# Patient Record
Sex: Male | Born: 1945 | Race: White | Hispanic: No | Marital: Single | Smoking: Former smoker
Health system: Southern US, Community
[De-identification: ages and names within clinical notes are randomized; demographics above are authoritative.]

## PROBLEM LIST (undated history)

## (undated) DIAGNOSIS — E119 Type 2 diabetes mellitus without complications: Secondary | ICD-10-CM

## (undated) DIAGNOSIS — F32A Depression, unspecified: Secondary | ICD-10-CM

## (undated) DIAGNOSIS — R5383 Other fatigue: Secondary | ICD-10-CM

## (undated) DIAGNOSIS — IMO0001 Reserved for inherently not codable concepts without codable children: Secondary | ICD-10-CM

## (undated) DIAGNOSIS — K219 Gastro-esophageal reflux disease without esophagitis: Secondary | ICD-10-CM

## (undated) DIAGNOSIS — N183 Chronic kidney disease, stage 3 unspecified: Secondary | ICD-10-CM

## (undated) DIAGNOSIS — F329 Major depressive disorder, single episode, unspecified: Secondary | ICD-10-CM

## (undated) DIAGNOSIS — M1990 Unspecified osteoarthritis, unspecified site: Secondary | ICD-10-CM

## (undated) DIAGNOSIS — E785 Hyperlipidemia, unspecified: Secondary | ICD-10-CM

## (undated) DIAGNOSIS — J449 Chronic obstructive pulmonary disease, unspecified: Secondary | ICD-10-CM

## (undated) DIAGNOSIS — D631 Anemia in chronic kidney disease: Secondary | ICD-10-CM

---

## 2012-11-12 DIAGNOSIS — R209 Unspecified disturbances of skin sensation: Secondary | ICD-10-CM

## 2013-04-08 ENCOUNTER — Encounter (HOSPITAL_COMMUNITY): Payer: Self-pay | Admitting: Emergency Medicine

## 2013-04-08 ENCOUNTER — Emergency Department (HOSPITAL_COMMUNITY): Payer: Medicare Other

## 2013-04-08 ENCOUNTER — Emergency Department (HOSPITAL_COMMUNITY)
Admission: EM | Admit: 2013-04-08 | Discharge: 2013-04-08 | Disposition: A | Discharge status: Home / Self Care | Payer: Medicare Other | Attending: Emergency Medicine | Admitting: Emergency Medicine

## 2013-04-08 DIAGNOSIS — S7002XA Contusion of left hip, initial encounter: Secondary | ICD-10-CM

## 2013-04-08 DIAGNOSIS — S7000XA Contusion of unspecified hip, initial encounter: Secondary | ICD-10-CM | POA: Insufficient documentation

## 2013-04-08 DIAGNOSIS — Y9210 Unspecified residential institution as the place of occurrence of the external cause: Secondary | ICD-10-CM | POA: Insufficient documentation

## 2013-04-08 DIAGNOSIS — E119 Type 2 diabetes mellitus without complications: Secondary | ICD-10-CM | POA: Insufficient documentation

## 2013-04-08 DIAGNOSIS — Z862 Personal history of diseases of the blood and blood-forming organs and certain disorders involving the immune mechanism: Secondary | ICD-10-CM | POA: Insufficient documentation

## 2013-04-08 DIAGNOSIS — F329 Major depressive disorder, single episode, unspecified: Secondary | ICD-10-CM | POA: Insufficient documentation

## 2013-04-08 DIAGNOSIS — M129 Arthropathy, unspecified: Secondary | ICD-10-CM | POA: Insufficient documentation

## 2013-04-08 DIAGNOSIS — F17200 Nicotine dependence, unspecified, uncomplicated: Secondary | ICD-10-CM | POA: Insufficient documentation

## 2013-04-08 DIAGNOSIS — W010XXA Fall on same level from slipping, tripping and stumbling without subsequent striking against object, initial encounter: Secondary | ICD-10-CM | POA: Insufficient documentation

## 2013-04-08 DIAGNOSIS — J441 Chronic obstructive pulmonary disease with (acute) exacerbation: Secondary | ICD-10-CM | POA: Insufficient documentation

## 2013-04-08 DIAGNOSIS — Z7982 Long term (current) use of aspirin: Secondary | ICD-10-CM | POA: Insufficient documentation

## 2013-04-08 DIAGNOSIS — S20229A Contusion of unspecified back wall of thorax, initial encounter: Secondary | ICD-10-CM | POA: Insufficient documentation

## 2013-04-08 DIAGNOSIS — S300XXA Contusion of lower back and pelvis, initial encounter: Secondary | ICD-10-CM

## 2013-04-08 DIAGNOSIS — Y939 Activity, unspecified: Secondary | ICD-10-CM | POA: Insufficient documentation

## 2013-04-08 DIAGNOSIS — K219 Gastro-esophageal reflux disease without esophagitis: Secondary | ICD-10-CM | POA: Insufficient documentation

## 2013-04-08 DIAGNOSIS — F3289 Other specified depressive episodes: Secondary | ICD-10-CM | POA: Insufficient documentation

## 2013-04-08 DIAGNOSIS — IMO0002 Reserved for concepts with insufficient information to code with codable children: Secondary | ICD-10-CM | POA: Insufficient documentation

## 2013-04-08 DIAGNOSIS — Z79899 Other long term (current) drug therapy: Secondary | ICD-10-CM | POA: Insufficient documentation

## 2013-04-08 HISTORY — DX: Depression, unspecified: F32.A

## 2013-04-08 HISTORY — DX: Major depressive disorder, single episode, unspecified: F32.9

## 2013-04-08 HISTORY — DX: Reserved for inherently not codable concepts without codable children: IMO0001

## 2013-04-08 HISTORY — DX: Gastro-esophageal reflux disease without esophagitis: K21.9

## 2013-04-08 HISTORY — DX: Other fatigue: R53.83

## 2013-04-08 HISTORY — DX: Type 2 diabetes mellitus without complications: E11.9

## 2013-04-08 HISTORY — DX: Unspecified osteoarthritis, unspecified site: M19.90

## 2013-04-08 HISTORY — DX: Chronic obstructive pulmonary disease, unspecified: J44.9

## 2013-04-08 LAB — GLUCOSE, CAPILLARY: Glucose-Capillary: 236 mg/dL — ABNORMAL HIGH (ref 70–99)

## 2013-04-08 MED ORDER — HYDROCODONE-ACETAMINOPHEN 5-325 MG PO TABS
1.0000 | ORAL_TABLET | Freq: Once | ORAL | Status: AC
  Administered 2013-04-08: 1 via ORAL
  Filled 2013-04-08: qty 1

## 2013-04-08 MED ORDER — IBUPROFEN 400 MG PO TABS
400.0000 mg | ORAL_TABLET | Freq: Once | ORAL | Status: AC
  Administered 2013-04-08: 400 mg via ORAL
  Filled 2013-04-08: qty 1

## 2013-04-08 MED ORDER — IBUPROFEN 400 MG PO TABS: 400.0000 mg | ORAL_TABLET | Freq: Four times a day (QID) | ORAL | Status: DC | PRN

## 2013-04-08 MED ORDER — HYDROCODONE-ACETAMINOPHEN 5-325 MG PO TABS
1.0000 | ORAL_TABLET | Freq: Four times a day (QID) | ORAL | Status: DC | PRN
Start: 2013-04-08 — End: 2017-07-04

## 2013-04-08 NOTE — Discharge Instructions (Signed)
The x-ray of your lower back is negative for fracture or dislocation. Your examination is consistent with a bruise or contusion to the back and upper hip area. Please use ibuprofen every 6 hours for mild pain, use Norco for more severe pain. Norco may cause drowsiness, please use with caution. Please see your primary physician, or follow up with the orthopedist listed above if not improving. Contusion A contusion is a deep bruise. Contusions happen when an injury causes bleeding under the skin. Signs of bruising include pain, puffiness (swelling), and discolored skin. The contusion may turn blue, purple, or yellow. HOME CARE   Put ice on the injured area.  Put ice in a plastic bag.  Place a towel between your skin and the bag.  Leave the ice on for 15-20 minutes, 03-04 times a day.  Only take medicine as told by your doctor.  Rest the injured area.  If possible, raise (elevate) the injured area to lessen puffiness. GET HELP RIGHT AWAY IF:   You have more bruising or puffiness.  You have pain that is getting worse.  Your puffiness or pain is not helped by medicine. MAKE SURE YOU:   Understand these instructions.  Will watch your condition.  Will get help right away if you are not doing well or get worse. Document Released: 08/29/2007 Document Revised: 06/04/2011 Document Reviewed: 01/15/2011 [facility] Patient Information 2014 [place]ExitCare, [place].

## 2013-04-08 NOTE — ED Notes (Signed)
Pt states tripped and fell Saturday. Pt is resident of [facility]. Ambulating well. Pt c/o pain to left lower back radiating into hip and down leg.

## 2013-04-08 NOTE — ED Notes (Signed)
Med rec completed by Kathie DikePharm Tech.

## 2013-04-08 NOTE — ED Provider Notes (Signed)
CSN: [id number]     Arrival date & time 04/08/13  1351 History   First MD Initiated Contact with Patient 04/08/13 1413     Chief Complaint  Patient presents with  . Back Pain   (Consider location/radiation/quality/duration/timing/severity/associated sxs/prior Treatment) HPI Comments: Patient is a [age] year resident of one of the local assisted living facilities. The patient states that on Saturday, January 10 he sustained a fall from a standing position. The patient states he has had pain and soreness of the left lower back and hip since that time. Today he noticed a pain that went out only to the hip but part way down the left thigh and presents now to the emergency department for additional evaluation of this problem. The patient denies any previous operations or procedures involving the left hip or the back. He denies being on any blood thinning type medications. And he denies any bleeding disorders. He has been using conservative measures at the assisted living facility, and continues to have pain. He is ambulatory, but states he is to take a" slow".  The history is provided by the patient.    Past Medical History  Diagnosis Date  . Diabetes mellitus without complication   . Reflux   . Anemia   . Arthritis   . COPD (chronic obstructive pulmonary disease)   . Depression   . Fatigue    History reviewed. No pertinent past surgical history. History reviewed. No pertinent family history. History  Substance Use Topics  . Smoking status: Current Every Day Smoker    Types: Pipe  . Smokeless tobacco: Not on file  . Alcohol Use: No    Review of Systems  Constitutional: Negative for activity change.       All ROS Neg except as noted in HPI  HENT: Negative for nosebleeds.   Eyes: Negative for photophobia and discharge.  Respiratory: Positive for cough and shortness of breath. Negative for wheezing.   Cardiovascular: Negative for chest pain and palpitations.  Gastrointestinal: Negative  for abdominal pain and blood in stool.  Genitourinary: Negative for dysuria, frequency and hematuria.  Musculoskeletal: Positive for arthralgias and back pain. Negative for neck pain.  Skin: Negative.   Neurological: Negative for dizziness, seizures and speech difficulty.  Psychiatric/Behavioral: Negative for hallucinations and confusion.       Depression    Allergies  Review of patient's allergies indicates no known allergies.  Home Medications   Current Outpatient Rx  Name  Route  Sig  Dispense  Refill  . aspirin EC 81 MG tablet   Oral   Take 81 mg by mouth daily.         . cetirizine (ZYRTEC) 10 MG tablet   Oral   Take 10 mg by mouth at bedtime.         . cholecalciferol (VITAMIN D) 1000 UNITS tablet   Oral   Take 2,000 Units by mouth daily.         . citalopram (CELEXA) 40 MG tablet   Oral   Take 1 tablet by mouth at bedtime.         . COMBIVENT RESPIMAT 20-100 MCG/ACT AERS respimat   Inhalation   Inhale 2 puffs into the lungs 2 (two) times daily.         . fluticasone (FLONASE) 50 MCG/ACT nasal spray   Each Nare   Place 2 sprays into both nostrils daily.         . hydrOXYzine (ATARAX/VISTARIL) 25 MG tablet  Oral   Take 1 tablet by mouth daily at 12 noon.         . lamoTRIgine (LAMICTAL) 100 MG tablet   Oral   Take 1 tablet by mouth daily.         Marland Kitchen LEVEMIR 100 UNIT/ML injection   Subcutaneous   Inject 34 Units into the skin at bedtime.         Marland Kitchen omeprazole (PRILOSEC) 20 MG capsule   Oral   Take 1 capsule by mouth daily.         . ONGLYZA 5 MG TABS tablet   Oral   Take 1 tablet by mouth daily.         . tamsulosin (FLOMAX) 0.4 MG CAPS capsule   Oral   Take 1 capsule by mouth daily.         . traZODone (DESYREL) 100 MG tablet   Oral   Take 1 tablet by mouth at bedtime.         Marland Kitchen HYDROcodone-acetaminophen (NORCO) 5-325 MG per tablet   Oral   Take 1 tablet by mouth every 6 (six) hours as needed for moderate pain.   15  tablet   0   . ibuprofen (ADVIL,MOTRIN) 400 MG tablet   Oral   Take 1 tablet (400 mg total) by mouth every 6 (six) hours as needed.   20 tablet   0    BP 115/63  Pulse 92  Temp(Src) 98 F (36.7 C) (Oral)  Resp 19  SpO2 95% Physical Exam  Nursing note and vitals reviewed. Constitutional: He is oriented to person, place, and time. He appears well-developed and well-nourished.  Non-toxic appearance.  HENT:  Head: Normocephalic.  Right Ear: Tympanic membrane and external ear normal.  Left Ear: Tympanic membrane and external ear normal.  Eyes: EOM and lids are normal. Pupils are equal, round, and reactive to light.  Neck: Normal range of motion. Neck supple. Carotid bruit is not present.  Cardiovascular: Normal rate, regular rhythm, normal heart sounds, intact distal pulses and normal pulses.   Pulmonary/Chest: Breath sounds normal. No respiratory distress.  Abdominal: Soft. Bowel sounds are normal. There is no tenderness. There is no guarding.  Musculoskeletal: Normal range of motion.  There is soreness to palpation of the lower lumbar area. It is also soreness to the paraspinal region of the lower lumbar region.  There is soreness with range of motion of the left hip. There is no evidence of dislocation. There is no fracture or shortening noted of the left lower extremity. There is no unusual swelling of the left lower extremity.  Lymphadenopathy:       Head (right side): No submandibular adenopathy present.       Head (left side): No submandibular adenopathy present.    He has no cervical adenopathy.  Neurological: He is alert and oriented to person, place, and time. He has normal strength. No cranial nerve deficit or sensory deficit.  Skin: Skin is warm and dry.  Psychiatric: He has a normal mood and affect. His speech is normal.    ED Course  Procedures (including critical care time) Labs Review Labs Reviewed  GLUCOSE, CAPILLARY - Abnormal; Notable for the following:     Glucose-Capillary 236 (*)    All other components within normal limits   Imaging Review Dg Hip Complete Left  04/08/2013   CLINICAL DATA:  Fall several days ago, low back pain radiating into left hip  EXAM: LEFT HIP - COMPLETE 2+ VIEW  COMPARISON:  Prior CT abdomen/ pelvis 11/12/2012  FINDINGS: There is no evidence of hip fracture or dislocation. There is no evidence of arthropathy or other focal bone abnormality.  IMPRESSION: Negative.   Electronically Signed   By: Malachy Moan M.D.   On: 04/08/2013 15:15    EKG Interpretation   None       MDM   1. Contusion of lower back   2. Contusion of left hip    *I have reviewed nursing notes, vital signs, and all appropriate lab and imaging results for this patient.**  Patient sustained a fall from a standing position on January 10. He continues to have pain and soreness in this area. X-ray of the left hip is negative for fracture or dislocation. The patient is ambulatory, and his gait is steady. Seen with me by Dr Adriana Simas.  The patient is given prescription for ibuprofen 400 mg every 6 hours, and Norco 5 mg every 6 hours if needed for pain. The patient is to follow up with his primary physician, or with Dr. Romeo Apple if not improving.  Kathie Dike, PA-C 04/08/13 [phone]

## 2013-04-11 NOTE — ED Provider Notes (Signed)
Medical screening examination/treatment/procedure(s) were performed by non-physician practitioner and as supervising physician I was immediately available for consultation/collaboration.  EKG Interpretation   None        Valmai Vandenberghe, MD 04/11/13 0826 

## 2013-08-12 ENCOUNTER — Emergency Department (HOSPITAL_COMMUNITY): Payer: Medicare Other

## 2013-08-12 ENCOUNTER — Emergency Department (HOSPITAL_COMMUNITY)
Admission: EM | Admit: 2013-08-12 | Discharge: 2013-08-12 | Disposition: A | Discharge status: Home / Self Care | Payer: Medicare Other | Attending: Emergency Medicine | Admitting: Emergency Medicine

## 2013-08-12 DIAGNOSIS — Y929 Unspecified place or not applicable: Secondary | ICD-10-CM | POA: Insufficient documentation

## 2013-08-12 DIAGNOSIS — S8990XA Unspecified injury of unspecified lower leg, initial encounter: Secondary | ICD-10-CM | POA: Insufficient documentation

## 2013-08-12 DIAGNOSIS — S99929A Unspecified injury of unspecified foot, initial encounter: Principal | ICD-10-CM

## 2013-08-12 DIAGNOSIS — J4489 Other specified chronic obstructive pulmonary disease: Secondary | ICD-10-CM | POA: Insufficient documentation

## 2013-08-12 DIAGNOSIS — IMO0002 Reserved for concepts with insufficient information to code with codable children: Secondary | ICD-10-CM | POA: Insufficient documentation

## 2013-08-12 DIAGNOSIS — F3289 Other specified depressive episodes: Secondary | ICD-10-CM | POA: Insufficient documentation

## 2013-08-12 DIAGNOSIS — W2203XA Walked into furniture, initial encounter: Secondary | ICD-10-CM | POA: Insufficient documentation

## 2013-08-12 DIAGNOSIS — Y9389 Activity, other specified: Secondary | ICD-10-CM | POA: Insufficient documentation

## 2013-08-12 DIAGNOSIS — Z7982 Long term (current) use of aspirin: Secondary | ICD-10-CM | POA: Insufficient documentation

## 2013-08-12 DIAGNOSIS — Z79899 Other long term (current) drug therapy: Secondary | ICD-10-CM | POA: Insufficient documentation

## 2013-08-12 DIAGNOSIS — Z862 Personal history of diseases of the blood and blood-forming organs and certain disorders involving the immune mechanism: Secondary | ICD-10-CM | POA: Insufficient documentation

## 2013-08-12 DIAGNOSIS — F17200 Nicotine dependence, unspecified, uncomplicated: Secondary | ICD-10-CM | POA: Insufficient documentation

## 2013-08-12 DIAGNOSIS — S99919A Unspecified injury of unspecified ankle, initial encounter: Principal | ICD-10-CM

## 2013-08-12 DIAGNOSIS — K219 Gastro-esophageal reflux disease without esophagitis: Secondary | ICD-10-CM | POA: Insufficient documentation

## 2013-08-12 DIAGNOSIS — I10 Essential (primary) hypertension: Secondary | ICD-10-CM | POA: Insufficient documentation

## 2013-08-12 DIAGNOSIS — E119 Type 2 diabetes mellitus without complications: Secondary | ICD-10-CM | POA: Insufficient documentation

## 2013-08-12 DIAGNOSIS — J449 Chronic obstructive pulmonary disease, unspecified: Secondary | ICD-10-CM | POA: Insufficient documentation

## 2013-08-12 DIAGNOSIS — R209 Unspecified disturbances of skin sensation: Secondary | ICD-10-CM | POA: Insufficient documentation

## 2013-08-12 DIAGNOSIS — F329 Major depressive disorder, single episode, unspecified: Secondary | ICD-10-CM | POA: Insufficient documentation

## 2013-08-12 DIAGNOSIS — Z9889 Other specified postprocedural states: Secondary | ICD-10-CM | POA: Insufficient documentation

## 2013-08-12 DIAGNOSIS — M129 Arthropathy, unspecified: Secondary | ICD-10-CM | POA: Insufficient documentation

## 2013-08-12 DIAGNOSIS — M25569 Pain in unspecified knee: Secondary | ICD-10-CM

## 2013-08-12 DIAGNOSIS — M6281 Muscle weakness (generalized): Secondary | ICD-10-CM | POA: Insufficient documentation

## 2013-08-12 LAB — CBC WITH DIFFERENTIAL/PLATELET
BASOS ABS: 0.1 10*3/uL (ref 0.0–0.1)
BASOS PCT: 1 % (ref 0–1)
Eosinophils Absolute: 0.4 10*3/uL (ref 0.0–0.7)
Eosinophils Relative: 6 % — ABNORMAL HIGH (ref 0–5)
HCT: 30.9 % — ABNORMAL LOW (ref 39.0–52.0)
Hemoglobin: 9.9 g/dL — ABNORMAL LOW (ref 13.0–17.0)
Lymphocytes Relative: 33 % (ref 12–46)
Lymphs Abs: 2.1 10*3/uL (ref 0.7–4.0)
MCH: 26.9 pg (ref 26.0–34.0)
MCHC: 32 g/dL (ref 30.0–36.0)
MCV: 84 fL (ref 78.0–100.0)
Monocytes Absolute: 0.5 10*3/uL (ref 0.1–1.0)
Monocytes Relative: 8 % (ref 3–12)
Neutro Abs: 3.3 10*3/uL (ref 1.7–7.7)
Neutrophils Relative %: 52 % (ref 43–77)
PLATELETS: 240 10*3/uL (ref 150–400)
RBC: 3.68 MIL/uL — ABNORMAL LOW (ref 4.22–5.81)
RDW: 15.3 % (ref 11.5–15.5)
WBC: 6.4 10*3/uL (ref 4.0–10.5)

## 2013-08-12 LAB — CBG MONITORING, ED: Glucose-Capillary: 153 mg/dL — ABNORMAL HIGH (ref 70–99)

## 2013-08-12 LAB — BASIC METABOLIC PANEL
BUN: 25 mg/dL — AB (ref 6–23)
CO2: 28 mEq/L (ref 19–32)
Calcium: 9.3 mg/dL (ref 8.4–10.5)
Chloride: 101 mEq/L (ref 96–112)
Creatinine, Ser: 1.37 mg/dL — ABNORMAL HIGH (ref 0.50–1.35)
GFR, EST AFRICAN AMERICAN: 61 mL/min — AB (ref >90)
GFR, EST NON AFRICAN AMERICAN: 53 mL/min — AB (ref >90)
Glucose, Bld: 176 mg/dL — ABNORMAL HIGH (ref 70–99)
POTASSIUM: 4.9 meq/L (ref 3.7–5.3)
SODIUM: 140 meq/L (ref 137–147)

## 2013-08-12 LAB — D-DIMER, QUANTITATIVE: D-Dimer, Quant: 0.5 ug/mL-FEU — ABNORMAL HIGH (ref 0.00–0.48)

## 2013-08-12 MED ORDER — ENOXAPARIN SODIUM 100 MG/ML ~~LOC~~ SOLN
96.0000 mg | Freq: Once | SUBCUTANEOUS | Status: AC
  Administered 2013-08-12: 95 mg via SUBCUTANEOUS
  Filled 2013-08-12: qty 1

## 2013-08-12 NOTE — ED Provider Notes (Signed)
CSN: [id number]     Arrival date & time 08/12/13  1555 History  This chart was scribed for Glynn OctaveStephen Elisavet Buehrer, MD by Danella Maiersaroline Early, ED Scribe. This patient was seen in room APA11/APA11 and the patient's care was started at 8:36 PM.    Chief Complaint  Patient presents with  . Leg Pain  . Knee Pain   The history is provided by the patient. No language interpreter was used.   HPI Comments: Douglas Cook is a 68 y.o. male with a h/o DM and COPD who [facility] complaining of constant right knee pain that radiates into the back of the right upper leg with associated, numbness, and tingling in the right leg since bumping the knee against the rail of the bed 2 weeks ago. He reports a h/o right knee surgery 5 years ago. Nothing makes the pain better or worse. He denies CP, SOB, abdominal pain, back pain, urinary or bowel incontinence. No h/o DVT or PE or kidney problems.    Past Medical History  Diagnosis Date  . Diabetes mellitus without complication   . Reflux   . Anemia   . Arthritis   . COPD (chronic obstructive pulmonary disease)   . Depression   . Fatigue    No past surgical history on file. No family history on file. History  Substance Use Topics  . Smoking status: Current Every Day Smoker    Types: Pipe  . Smokeless tobacco: Not on file  . Alcohol Use: No    Review of Systems  Respiratory: Negative for shortness of breath.   Cardiovascular: Negative for chest pain.  Gastrointestinal: Negative for abdominal pain.  Musculoskeletal: Negative for back pain.  Neurological: Positive for weakness and numbness.  All other systems reviewed and are negative.  A complete 10 system review of systems was obtained and all systems are negative except as noted in the HPI and PMH.     Allergies  Review of patient's allergies indicates no known allergies.  Home Medications   Prior to Admission medications   Medication Sig Start Date End  Date Taking? Authorizing Provider  aspirin EC 81 MG tablet Take 81 mg by mouth daily.    Historical Provider, MD  cetirizine (ZYRTEC) 10 MG tablet Take 10 mg by mouth at bedtime.    Historical Provider, MD  cholecalciferol (VITAMIN D) 1000 UNITS tablet Take 2,000 Units by mouth daily.    Historical Provider, MD  citalopram (CELEXA) 40 MG tablet Take 1 tablet by mouth at bedtime. 04/08/13   Historical Provider, MD  COMBIVENT RESPIMAT 20-100 MCG/ACT AERS respimat Inhale 2 puffs into the lungs 2 (two) times daily. 02/11/13   Historical Provider, MD  fluticasone (FLONASE) 50 MCG/ACT nasal spray Place 2 sprays into both nostrils daily. 03/02/13   Historical Provider, MD  HYDROcodone-acetaminophen (NORCO) 5-325 MG per tablet Take 1 tablet by mouth every 6 (six) hours as needed for moderate pain. 04/08/13   Kathie DikeHobson M Bryant, PA-C  hydrOXYzine (ATARAX/VISTARIL) 25 MG tablet Take 1 tablet by mouth daily at 12 noon. 04/08/13   Historical Provider, MD  ibuprofen (ADVIL,MOTRIN) 400 MG tablet Take 1 tablet (400 mg total) by mouth every 6 (six) hours as needed. 04/08/13   Kathie DikeHobson M Bryant, PA-C  lamoTRIgine (LAMICTAL) 100 MG tablet Take 1 tablet by mouth daily. 04/08/13   Historical Provider, MD  LEVEMIR 100 UNIT/ML injection Inject 34 Units into the skin at bedtime. 03/18/13   Historical Provider, MD  omeprazole (PRILOSEC) 20 MG capsule Take 1 capsule by mouth daily. 04/08/13   Historical Provider, MD  ONGLYZA 5 MG TABS tablet Take 1 tablet by mouth daily. 04/08/13   Historical Provider, MD  tamsulosin (FLOMAX) 0.4 MG CAPS capsule Take 1 capsule by mouth daily. 04/08/13   Historical Provider, MD  traZODone (DESYREL) 100 MG tablet Take 1 tablet by mouth at bedtime. 04/08/13   Historical Provider, MD   BP 119/60  Pulse 83  Temp(Src) 97.9 F (36.6 C) (Oral)  Resp 16  Ht 5\' 10"  (1.778 m)  Wt 212 lb (96.163 kg)  BMI 30.42 kg/m2  SpO2 97% Physical Exam  Nursing note and vitals reviewed. Constitutional: He is oriented  to person, place, and time. He appears well-developed and well-nourished. No distress.  HENT:  Head: Normocephalic and atraumatic.  Eyes: EOM are normal.  Neck: Neck supple. No tracheal deviation present.  Cardiovascular: Normal rate.   Pulmonary/Chest: Effort normal and breath sounds normal. No respiratory distress. He has no wheezes.  Musculoskeletal: Normal range of motion.  Full rom of the right knee with pain. No ligament laxity. Flexion and extension intact. Abrasion over the right patellar tendon. Intact DP pulses. Slightly increased pretibial edema on right compared to left.  5/5 strength in bilateral lower extremities. Ankle plantar and dorsiflexion intact. Great toe extension intact bilaterally. +2 DP and PT pulses. +2 patellar reflexes bilaterally. Normal gait.   Neurological: He is alert and oriented to person, place, and time.  Skin: Skin is warm and dry.  Psychiatric: He has a normal mood and affect. His behavior is normal.    ED Course  Procedures (including critical care time) Medications  enoxaparin (LOVENOX) injection 95 mg (95 mg Subcutaneous Given 08/12/13 2255)    DIAGNOSTIC STUDIES: Oxygen Saturation is 97% on RA, normal by my interpretation.    COORDINATION OF CARE: 8:44 PM- Discussed treatment plan with pt which includes blood work and return tomorrow for [place]. Pt agrees to plan.    Labs Review Labs Reviewed  CBC WITH DIFFERENTIAL - Abnormal; Notable for the following:    RBC 3.68 (*)    Hemoglobin 9.9 (*)    HCT 30.9 (*)    Eosinophils Relative 6 (*)    All other components within normal limits  BASIC METABOLIC PANEL - Abnormal; Notable for the following:    Glucose, Bld 176 (*)    BUN 25 (*)    Creatinine, Ser 1.37 (*)    GFR calc non Af Amer 53 (*)    GFR calc Af Amer 61 (*)    All other components within normal limits  D-DIMER, QUANTITATIVE - Abnormal; Notable for the following:    D-Dimer, Quant 0.50 (*)    All other components within normal  limits  CBG MONITORING, ED - Abnormal; Notable for the following:    Glucose-Capillary 153 (*)    All other components within normal limits    Imaging Review Dg Knee Complete 4 Views Right  08/12/2013   CLINICAL DATA:  Right knee pain radiating back into the leg for 1 week.  EXAM: RIGHT KNEE - COMPLETE 4+ VIEW  COMPARISON:  None.  FINDINGS: There is a small knee joint effusion. No acute fracture or dislocation is identified. Tricompartmental chondrocalcinosis is present. Very mild tricompartmental marginal osteophyte formation is noted. Small superior patellar enthesophyte is present. Joint spaces are preserved. Bone mineralization appears normal.  IMPRESSION: 1. No acute osseous abnormality identified. 2. Small knee joint effusion. 3. Chondrocalcinosis, which is  nonspecific but can be seen in the setting of calcium pyrophosphate deposition disease.   Electronically Signed   By: Sebastian AcheAllen  Grady   On: 08/12/2013 18:03     EKG Interpretation None      MDM   Final diagnoses:  Knee pain   Right knee pain for the past 2 weeks after hitting it on a railing. No weakness, numbness or tingling. Patient reports it pops with movement. No chest pain or shortness of breath. No back pain.  Full range of motion of the knee. Flexion and extension intact. X-ray negative for acute osseous abnormality. Concern for a blood clot possibly will dose Lovenox and obtaining ultrasound tomorrow. Patient able to ambulate without difficulty.  Knee sleeve given.   BP 129/67  Pulse 62  Temp(Src) 98.1 F (36.7 C) (Oral)  Resp 20  Ht 5\' 10"  (1.778 m)  Wt 212 lb (96.163 kg)  BMI 30.42 kg/m2  SpO2 97%   I personally performed the services described in this documentation, which was scribed in my presence. The recorded information has been reviewed and is accurate.   Glynn OctaveStephen Delance Weide, MD 08/12/13 2320

## 2013-08-12 NOTE — ED Notes (Signed)
Patient c/o pain in right knee that radiates into back leg with movement. Per patient pain starts in both sides of knees and pain is worse with moving knee. Patient states knee pops with movement. Patient reports taking ibuprofen with no relief.

## 2013-08-12 NOTE — Discharge Instructions (Signed)
Arthralgia Use anti-inflammatories as needed for pain. Return tomorrow for ultrasound of your leg. Return to the ER sooner if you develop new or worsening symptoms. Your caregiver has diagnosed you as suffering from an arthralgia. Arthralgia means there is pain in a joint. This can come from many reasons including:  Bruising the joint which causes soreness (inflammation) in the joint.  Wear and tear on the joints which occur as we grow older (osteoarthritis).  Overusing the joint.  Various forms of arthritis.  Infections of the joint. Regardless of the cause of pain in your joint, most of these different pains respond to anti-inflammatory drugs and rest. The exception to this is when a joint is infected, and these cases are treated with antibiotics, if it is a bacterial infection. HOME CARE INSTRUCTIONS   Rest the injured area for as long as directed by your caregiver. Then slowly start using the joint as directed by your caregiver and as the pain allows. Crutches as directed may be useful if the ankles, knees or hips are involved. If the knee was splinted or casted, continue use and care as directed. If an stretchy or elastic wrapping bandage has been applied today, it should be removed and re-applied every 3 to 4 hours. It should not be applied tightly, but firmly enough to keep swelling down. Watch toes and feet for swelling, bluish discoloration, coldness, numbness or excessive pain. If any of these problems (symptoms) occur, remove the ace bandage and re-apply more loosely. If these symptoms persist, contact your caregiver or return to this location.  For the first 24 hours, keep the injured extremity elevated on pillows while lying down.  Apply ice for 15-20 minutes to the sore joint every couple hours while awake for the first half day. Then 03-04 times per day for the first 48 hours. Put the ice in a plastic bag and place a towel between the bag of ice and your skin.  Wear any  splinting, casting, elastic bandage applications, or slings as instructed.  Only take over-the-counter or prescription medicines for pain, discomfort, or fever as directed by your caregiver. Do not use aspirin immediately after the injury unless instructed by your physician. Aspirin can cause increased bleeding and bruising of the tissues.  If you were given crutches, continue to use them as instructed and do not resume weight bearing on the sore joint until instructed. Persistent pain and inability to use the sore joint as directed for more than 2 to 3 days are warning signs indicating that you should see a caregiver for a follow-up visit as soon as possible. Initially, a hairline fracture (break in bone) may not be evident on X-rays. Persistent pain and swelling indicate that further evaluation, non-weight bearing or use of the joint (use of crutches or slings as instructed), or further X-rays are indicated. X-rays may sometimes not show a small fracture until a week or 10 days later. Make a follow-up appointment with your own caregiver or one to whom we have referred you. A radiologist (specialist in reading X-rays) may read your X-rays. Make sure you know how you are to obtain your X-ray results. Do not assume everything is normal if you do not hear from us. SEEK MEDICAL CARE IF: Bruising, swelling, or pain increases. SEEK IMMEDIATE MEDICAL CARE IF:   Your fingers or toes are numb or blue.  The pain is not responding to medications and continues to stay the same or get worse.  The pain in your joint becomes  severe.  You develop a fever over 102 F (38.9 C).  It becomes impossible to move or use the joint. MAKE SURE YOU:   Understand these instructions.  Will watch your condition.  Will get help right away if you are not doing well or get worse. Document Released: 03/12/2005 Document Revised: 06/04/2011 Document Reviewed: 10/29/2007 [facility] Patient Information 2014 [place],  [place].

## 2013-08-13 ENCOUNTER — Ambulatory Visit (HOSPITAL_COMMUNITY)
Admit: 2013-08-13 | Discharge: 2013-08-13 | Disposition: A | Discharge status: Home / Self Care | Payer: Medicare Other | Source: Ambulatory Visit | Attending: Emergency Medicine | Admitting: Emergency Medicine

## 2013-08-13 ENCOUNTER — Other Ambulatory Visit (HOSPITAL_COMMUNITY): Payer: Self-pay | Admitting: Emergency Medicine

## 2013-08-13 DIAGNOSIS — M7989 Other specified soft tissue disorders: Secondary | ICD-10-CM | POA: Insufficient documentation

## 2013-08-13 DIAGNOSIS — M79669 Pain in unspecified lower leg: Secondary | ICD-10-CM

## 2014-11-21 ENCOUNTER — Encounter (HOSPITAL_COMMUNITY): Payer: Self-pay

## 2014-11-21 ENCOUNTER — Emergency Department (HOSPITAL_COMMUNITY)
Admission: EM | Admit: 2014-11-21 | Discharge: 2014-11-21 | Disposition: A | Discharge status: Home / Self Care | Payer: Medicare Other | Attending: Emergency Medicine | Admitting: Emergency Medicine

## 2014-11-21 ENCOUNTER — Emergency Department (HOSPITAL_COMMUNITY): Payer: Medicare Other

## 2014-11-21 DIAGNOSIS — Z7951 Long term (current) use of inhaled steroids: Secondary | ICD-10-CM | POA: Diagnosis not present

## 2014-11-21 DIAGNOSIS — Z794 Long term (current) use of insulin: Secondary | ICD-10-CM | POA: Diagnosis not present

## 2014-11-21 DIAGNOSIS — J449 Chronic obstructive pulmonary disease, unspecified: Secondary | ICD-10-CM | POA: Insufficient documentation

## 2014-11-21 DIAGNOSIS — Z7982 Long term (current) use of aspirin: Secondary | ICD-10-CM | POA: Insufficient documentation

## 2014-11-21 DIAGNOSIS — E119 Type 2 diabetes mellitus without complications: Secondary | ICD-10-CM | POA: Insufficient documentation

## 2014-11-21 DIAGNOSIS — Z862 Personal history of diseases of the blood and blood-forming organs and certain disorders involving the immune mechanism: Secondary | ICD-10-CM | POA: Insufficient documentation

## 2014-11-21 DIAGNOSIS — Z720 Tobacco use: Secondary | ICD-10-CM | POA: Insufficient documentation

## 2014-11-21 DIAGNOSIS — K219 Gastro-esophageal reflux disease without esophagitis: Secondary | ICD-10-CM | POA: Diagnosis not present

## 2014-11-21 DIAGNOSIS — R079 Chest pain, unspecified: Secondary | ICD-10-CM | POA: Diagnosis present

## 2014-11-21 DIAGNOSIS — F329 Major depressive disorder, single episode, unspecified: Secondary | ICD-10-CM | POA: Diagnosis not present

## 2014-11-21 DIAGNOSIS — M1990 Unspecified osteoarthritis, unspecified site: Secondary | ICD-10-CM | POA: Insufficient documentation

## 2014-11-21 DIAGNOSIS — R0789 Other chest pain: Secondary | ICD-10-CM | POA: Diagnosis not present

## 2014-11-21 DIAGNOSIS — Z79899 Other long term (current) drug therapy: Secondary | ICD-10-CM | POA: Insufficient documentation

## 2014-11-21 LAB — CBC WITH DIFFERENTIAL/PLATELET
BASOS ABS: 0.1 10*3/uL (ref 0.0–0.1)
Basophils Relative: 1 % (ref 0–1)
EOS ABS: 1 10*3/uL — AB (ref 0.0–0.7)
EOS PCT: 11 % — AB (ref 0–5)
HCT: 28.4 % — ABNORMAL LOW (ref 39.0–52.0)
Hemoglobin: 8.9 g/dL — ABNORMAL LOW (ref 13.0–17.0)
LYMPHS ABS: 1.9 10*3/uL (ref 0.7–4.0)
LYMPHS PCT: 21 % (ref 12–46)
MCH: 25.5 pg — AB (ref 26.0–34.0)
MCHC: 31.3 g/dL (ref 30.0–36.0)
MCV: 81.4 fL (ref 78.0–100.0)
MONO ABS: 0.7 10*3/uL (ref 0.1–1.0)
Monocytes Relative: 8 % (ref 3–12)
Neutro Abs: 5.4 10*3/uL (ref 1.7–7.7)
Neutrophils Relative %: 59 % (ref 43–77)
PLATELETS: 248 10*3/uL (ref 150–400)
RBC: 3.49 MIL/uL — ABNORMAL LOW (ref 4.22–5.81)
RDW: 15.9 % — AB (ref 11.5–15.5)
WBC: 9 10*3/uL (ref 4.0–10.5)

## 2014-11-21 LAB — BASIC METABOLIC PANEL
Anion gap: 4 — ABNORMAL LOW (ref 5–15)
BUN: 25 mg/dL — AB (ref 6–20)
CALCIUM: 8.9 mg/dL (ref 8.9–10.3)
CO2: 29 mmol/L (ref 22–32)
Chloride: 106 mmol/L (ref 101–111)
Creatinine, Ser: 1.7 mg/dL — ABNORMAL HIGH (ref 0.61–1.24)
GFR calc Af Amer: 46 mL/min — ABNORMAL LOW (ref >60)
GFR, EST NON AFRICAN AMERICAN: 40 mL/min — AB (ref >60)
GLUCOSE: 178 mg/dL — AB (ref 65–99)
Potassium: 4.5 mmol/L (ref 3.5–5.1)
SODIUM: 139 mmol/L (ref 135–145)

## 2014-11-21 LAB — TROPONIN I: Troponin I: <0.03 ng/mL (ref <0.031)

## 2014-11-21 NOTE — ED Provider Notes (Signed)
CSN: [id number]     Arrival date & time 11/21/14  1448 History   First MD Initiated Contact with Patient 11/21/14 1450     Chief Complaint  Patient presents with  . Chest Pain     (Consider location/radiation/quality/duration/timing/severity/associated sxs/prior Treatment) HPI Comments: Patient presents to the emergency department for evaluation of chest pain. Patient reports that pain began around 8:30 or 9 AM. He reports a sharp pain in the left chest and axillary region. Patient has noticed that the pain worsens with movements and raising his arm. He denies any direct injury. He is not feeling short of breath. No nausea, diaphoresis. Patient has been given aspirin and nitroglycerin prior to arrival.  Patient is a 68 y.o. male presenting with chest pain.  Chest Pain Associated symptoms: no shortness of breath     Past Medical History  Diagnosis Date  . Diabetes mellitus without complication   . Reflux   . Anemia   . Arthritis   . COPD (chronic obstructive pulmonary disease)   . Depression   . Fatigue    History reviewed. No pertinent past surgical history. History reviewed. No pertinent family history. Social History  Substance Use Topics  . Smoking status: Current Every Day Smoker    Types: Pipe  . Smokeless tobacco: None  . Alcohol Use: No    Review of Systems  Respiratory: Negative for shortness of breath.   Cardiovascular: Positive for chest pain.  All other systems reviewed and are negative.     Allergies  Review of patient's allergies indicates no known allergies.  Home Medications   Prior to Admission medications   Medication Sig Start Date End Date Taking? Authorizing Provider  aspirin EC 81 MG tablet Take 81 mg by mouth daily.    Historical Provider, MD  cetirizine (ZYRTEC) 10 MG tablet Take 10 mg by mouth at bedtime.    Historical Provider, MD  cholecalciferol (VITAMIN D) 1000 UNITS tablet Take 2,000 Units by mouth daily.    Historical Provider, MD    citalopram (CELEXA) 40 MG tablet Take 1 tablet by mouth at bedtime. 04/08/13   Historical Provider, MD  COMBIVENT RESPIMAT 20-100 MCG/ACT AERS respimat Inhale 2 puffs into the lungs 2 (two) times daily. 02/11/13   Historical Provider, MD  fluticasone (FLONASE) 50 MCG/ACT nasal spray Place 2 sprays into both nostrils daily. 03/02/13   Historical Provider, MD  glyBURIDE-metformin (GLUCOVANCE) 5-500 MG per tablet Take 1 tablet by mouth 2 (two) times daily.    Historical Provider, MD  HYDROcodone-acetaminophen (NORCO) 5-325 MG per tablet Take 1 tablet by mouth every 6 (six) hours as needed for moderate pain. 04/08/13   Ivery Quale, PA-C  hydrOXYzine (ATARAX/VISTARIL) 25 MG tablet Take 1 tablet by mouth daily at 12 noon. 04/08/13   Historical Provider, MD  ibuprofen (ADVIL,MOTRIN) 400 MG tablet Take 400 mg by mouth every 6 (six) hours as needed. pain    Historical Provider, MD  insulin lispro (HUMALOG KWIKPEN) 100 UNIT/ML KiwkPen Inject 8 Units into the skin daily at 12 noon.    Historical Provider, MD  lamoTRIgine (LAMICTAL) 100 MG tablet Take 1 tablet by mouth daily. 04/08/13   Historical Provider, MD  LEVEMIR 100 UNIT/ML injection Inject 50 Units into the skin at bedtime.  03/18/13   Historical Provider, MD  omeprazole (PRILOSEC) 20 MG capsule Take 1 capsule by mouth daily. 04/08/13   Historical Provider, MD  ONGLYZA 5 MG TABS tablet Take 1 tablet by mouth daily. 04/08/13   Historical  Provider, MD  tamsulosin (FLOMAX) 0.4 MG CAPS capsule Take 1 capsule by mouth daily. 04/08/13   Historical Provider, MD  traZODone (DESYREL) 100 MG tablet Take 1 tablet by mouth at bedtime. 04/08/13   Historical Provider, MD   There were no vitals taken for this visit. Physical Exam  Constitutional: He is oriented to person, place, and time. He appears well-developed and well-nourished. No distress.  HENT:  Head: Normocephalic and atraumatic.  Right Ear: Hearing normal.  Left Ear: Hearing normal.  Nose: Nose normal.   Mouth/Throat: Oropharynx is clear and moist and mucous membranes are normal.  Eyes: Conjunctivae and EOM are normal. Pupils are equal, round, and reactive to light.  Neck: Normal range of motion. Neck supple.  Cardiovascular: Regular rhythm, S1 normal and S2 normal.  Exam reveals no gallop and no friction rub.   No murmur heard. Pulmonary/Chest: Effort normal and breath sounds normal. No respiratory distress. He exhibits tenderness.    Abdominal: Soft. Normal appearance and bowel sounds are normal. There is no hepatosplenomegaly. There is no tenderness. There is no rebound, no guarding, no tenderness at McBurney's point and negative Murphy's sign. No hernia.  Musculoskeletal: Normal range of motion.  Neurological: He is alert and oriented to person, place, and time. He has normal strength. No cranial nerve deficit or sensory deficit. Coordination normal. GCS eye subscore is 4. GCS verbal subscore is 5. GCS motor subscore is 6.  Skin: Skin is warm, dry and intact. No rash noted. No cyanosis.  Psychiatric: He has a normal mood and affect. His speech is normal and behavior is normal. Thought content normal.  Nursing note and vitals reviewed.   ED Course  Procedures (including critical care time) Labs Review Labs Reviewed - No data to display  Imaging Review No results found. I have personally reviewed and evaluated these images and lab results as part of my medical decision-making.   EKG Interpretation   Date/Time:  Sunday November 21 2014 14:52:54 EDT Ventricular Rate:  79 PR Interval:  166 QRS Duration: 76 QT Interval:  388 QTC Calculation: 445 R Axis:   72 Text Interpretation:  Sinus rhythm Baseline wander in lead(s) I III aVL No  previous tracing Confirmed by Natalie Mceuen  MD, Dayvin Aber ([id number]) on  11/21/2014 2:56:27 PM      MDM   Final diagnoses:  None   chest wall pain  Patient presents to the ER for evaluation of chest pain. Pain have been present for several hours of  arrival to the ER. Pain is very atypical in nature. Patient has reproduction of pain with palpation of the chest wall and he has significant pain with moving his left arm or bending and twisting the torso. He does, however, have cardiac risk factors including hypertension, high cholesterol, diabetes. EKG does not show any abnormalities at arrival. Troponin was negative. Patient was monitored for 3 hours in the ER and repeat troponin was negative once again. Patient is felt to be low risk and appropriate for prompt outpatient follow-up with PCP. Patient is to return to the ER for symptoms worsen.    Gilda Crease, MD 11/21/14 1800

## 2014-11-21 NOTE — Discharge Instructions (Signed)

## 2014-11-21 NOTE — ED Notes (Signed)
Pt in by EMS. Resident of [employer]. Resident reports that pain started in left chest approx 900am and went to church . Came back from church and worsened. Worse on inhalation. Given Asa and Nitro which decreased to 5

## 2015-04-12 DIAGNOSIS — B351 Tinea unguium: Secondary | ICD-10-CM | POA: Diagnosis not present

## 2015-04-12 DIAGNOSIS — M79674 Pain in right toe(s): Secondary | ICD-10-CM | POA: Diagnosis not present

## 2015-04-12 DIAGNOSIS — M79675 Pain in left toe(s): Secondary | ICD-10-CM | POA: Diagnosis not present

## 2015-06-30 DIAGNOSIS — E1165 Type 2 diabetes mellitus with hyperglycemia: Secondary | ICD-10-CM | POA: Diagnosis not present

## 2015-06-30 DIAGNOSIS — J449 Chronic obstructive pulmonary disease, unspecified: Secondary | ICD-10-CM | POA: Diagnosis not present

## 2015-07-14 DIAGNOSIS — H6120 Impacted cerumen, unspecified ear: Secondary | ICD-10-CM | POA: Diagnosis not present

## 2015-07-14 DIAGNOSIS — Z299 Encounter for prophylactic measures, unspecified: Secondary | ICD-10-CM | POA: Diagnosis not present

## 2015-07-21 DIAGNOSIS — Z7984 Long term (current) use of oral hypoglycemic drugs: Secondary | ICD-10-CM | POA: Diagnosis not present

## 2015-07-21 DIAGNOSIS — H25813 Combined forms of age-related cataract, bilateral: Secondary | ICD-10-CM | POA: Diagnosis not present

## 2015-07-21 DIAGNOSIS — E119 Type 2 diabetes mellitus without complications: Secondary | ICD-10-CM | POA: Diagnosis not present

## 2015-07-27 DIAGNOSIS — M79674 Pain in right toe(s): Secondary | ICD-10-CM | POA: Diagnosis not present

## 2015-07-27 DIAGNOSIS — B351 Tinea unguium: Secondary | ICD-10-CM | POA: Diagnosis not present

## 2015-07-27 DIAGNOSIS — M79675 Pain in left toe(s): Secondary | ICD-10-CM | POA: Diagnosis not present

## 2015-08-18 DIAGNOSIS — J449 Chronic obstructive pulmonary disease, unspecified: Secondary | ICD-10-CM | POA: Diagnosis not present

## 2015-08-18 DIAGNOSIS — E7800 Pure hypercholesterolemia, unspecified: Secondary | ICD-10-CM | POA: Diagnosis not present

## 2015-08-18 DIAGNOSIS — E119 Type 2 diabetes mellitus without complications: Secondary | ICD-10-CM | POA: Diagnosis not present

## 2015-10-06 DIAGNOSIS — E1165 Type 2 diabetes mellitus with hyperglycemia: Secondary | ICD-10-CM | POA: Diagnosis not present

## 2015-10-06 DIAGNOSIS — Z299 Encounter for prophylactic measures, unspecified: Secondary | ICD-10-CM | POA: Diagnosis not present

## 2015-10-06 DIAGNOSIS — E7800 Pure hypercholesterolemia, unspecified: Secondary | ICD-10-CM | POA: Diagnosis not present

## 2015-10-19 DIAGNOSIS — B351 Tinea unguium: Secondary | ICD-10-CM | POA: Diagnosis not present

## 2015-10-19 DIAGNOSIS — E7800 Pure hypercholesterolemia, unspecified: Secondary | ICD-10-CM | POA: Diagnosis not present

## 2015-10-19 DIAGNOSIS — E119 Type 2 diabetes mellitus without complications: Secondary | ICD-10-CM | POA: Diagnosis not present

## 2015-10-19 DIAGNOSIS — M79675 Pain in left toe(s): Secondary | ICD-10-CM | POA: Diagnosis not present

## 2015-10-19 DIAGNOSIS — J449 Chronic obstructive pulmonary disease, unspecified: Secondary | ICD-10-CM | POA: Diagnosis not present

## 2015-10-19 DIAGNOSIS — M79674 Pain in right toe(s): Secondary | ICD-10-CM | POA: Diagnosis not present

## 2015-10-20 DIAGNOSIS — R5382 Chronic fatigue, unspecified: Secondary | ICD-10-CM | POA: Diagnosis not present

## 2015-10-20 DIAGNOSIS — Z1211 Encounter for screening for malignant neoplasm of colon: Secondary | ICD-10-CM | POA: Diagnosis not present

## 2015-10-20 DIAGNOSIS — Z125 Encounter for screening for malignant neoplasm of prostate: Secondary | ICD-10-CM | POA: Diagnosis not present

## 2015-10-20 DIAGNOSIS — Z79899 Other long term (current) drug therapy: Secondary | ICD-10-CM | POA: Diagnosis not present

## 2015-10-20 DIAGNOSIS — Z7189 Other specified counseling: Secondary | ICD-10-CM | POA: Diagnosis not present

## 2015-10-20 DIAGNOSIS — Z299 Encounter for prophylactic measures, unspecified: Secondary | ICD-10-CM | POA: Diagnosis not present

## 2015-10-20 DIAGNOSIS — Z1389 Encounter for screening for other disorder: Secondary | ICD-10-CM | POA: Diagnosis not present

## 2015-10-20 DIAGNOSIS — E7800 Pure hypercholesterolemia, unspecified: Secondary | ICD-10-CM | POA: Diagnosis not present

## 2015-10-20 DIAGNOSIS — Z0000 Encounter for general adult medical examination without abnormal findings: Secondary | ICD-10-CM | POA: Diagnosis not present

## 2015-10-20 DIAGNOSIS — Z6830 Body mass index (BMI) 30.0-30.9, adult: Secondary | ICD-10-CM | POA: Diagnosis not present

## 2015-12-15 DIAGNOSIS — E7800 Pure hypercholesterolemia, unspecified: Secondary | ICD-10-CM | POA: Diagnosis not present

## 2015-12-15 DIAGNOSIS — E119 Type 2 diabetes mellitus without complications: Secondary | ICD-10-CM | POA: Diagnosis not present

## 2015-12-15 DIAGNOSIS — J449 Chronic obstructive pulmonary disease, unspecified: Secondary | ICD-10-CM | POA: Diagnosis not present

## 2016-01-04 DIAGNOSIS — E7800 Pure hypercholesterolemia, unspecified: Secondary | ICD-10-CM | POA: Diagnosis not present

## 2016-01-04 DIAGNOSIS — J449 Chronic obstructive pulmonary disease, unspecified: Secondary | ICD-10-CM | POA: Diagnosis not present

## 2016-01-04 DIAGNOSIS — E119 Type 2 diabetes mellitus without complications: Secondary | ICD-10-CM | POA: Diagnosis not present

## 2016-01-11 DIAGNOSIS — Z7984 Long term (current) use of oral hypoglycemic drugs: Secondary | ICD-10-CM | POA: Diagnosis not present

## 2016-01-11 DIAGNOSIS — H25813 Combined forms of age-related cataract, bilateral: Secondary | ICD-10-CM | POA: Diagnosis not present

## 2016-01-11 DIAGNOSIS — E119 Type 2 diabetes mellitus without complications: Secondary | ICD-10-CM | POA: Diagnosis not present

## 2016-01-11 DIAGNOSIS — Z794 Long term (current) use of insulin: Secondary | ICD-10-CM | POA: Diagnosis not present

## 2016-01-17 DIAGNOSIS — N400 Enlarged prostate without lower urinary tract symptoms: Secondary | ICD-10-CM | POA: Diagnosis not present

## 2016-01-17 DIAGNOSIS — Z299 Encounter for prophylactic measures, unspecified: Secondary | ICD-10-CM | POA: Diagnosis not present

## 2016-01-17 DIAGNOSIS — D649 Anemia, unspecified: Secondary | ICD-10-CM | POA: Diagnosis not present

## 2016-01-17 DIAGNOSIS — Z6830 Body mass index (BMI) 30.0-30.9, adult: Secondary | ICD-10-CM | POA: Diagnosis not present

## 2016-01-17 DIAGNOSIS — E1165 Type 2 diabetes mellitus with hyperglycemia: Secondary | ICD-10-CM | POA: Diagnosis not present

## 2016-01-17 DIAGNOSIS — Z23 Encounter for immunization: Secondary | ICD-10-CM | POA: Diagnosis not present

## 2016-02-02 DIAGNOSIS — B351 Tinea unguium: Secondary | ICD-10-CM | POA: Diagnosis not present

## 2016-02-02 DIAGNOSIS — M79675 Pain in left toe(s): Secondary | ICD-10-CM | POA: Diagnosis not present

## 2016-02-02 DIAGNOSIS — M79674 Pain in right toe(s): Secondary | ICD-10-CM | POA: Diagnosis not present

## 2016-02-13 DIAGNOSIS — E7800 Pure hypercholesterolemia, unspecified: Secondary | ICD-10-CM | POA: Diagnosis not present

## 2016-02-13 DIAGNOSIS — E119 Type 2 diabetes mellitus without complications: Secondary | ICD-10-CM | POA: Diagnosis not present

## 2016-02-13 DIAGNOSIS — J449 Chronic obstructive pulmonary disease, unspecified: Secondary | ICD-10-CM | POA: Diagnosis not present

## 2016-03-14 DIAGNOSIS — E119 Type 2 diabetes mellitus without complications: Secondary | ICD-10-CM | POA: Diagnosis not present

## 2016-03-14 DIAGNOSIS — E7800 Pure hypercholesterolemia, unspecified: Secondary | ICD-10-CM | POA: Diagnosis not present

## 2016-03-14 DIAGNOSIS — J449 Chronic obstructive pulmonary disease, unspecified: Secondary | ICD-10-CM | POA: Diagnosis not present

## 2016-07-12 DIAGNOSIS — E7800 Pure hypercholesterolemia, unspecified: Secondary | ICD-10-CM | POA: Diagnosis not present

## 2016-07-12 DIAGNOSIS — E119 Type 2 diabetes mellitus without complications: Secondary | ICD-10-CM | POA: Diagnosis not present

## 2016-07-12 DIAGNOSIS — J449 Chronic obstructive pulmonary disease, unspecified: Secondary | ICD-10-CM | POA: Diagnosis not present

## 2016-07-27 DIAGNOSIS — Z7984 Long term (current) use of oral hypoglycemic drugs: Secondary | ICD-10-CM | POA: Diagnosis not present

## 2016-07-27 DIAGNOSIS — H25813 Combined forms of age-related cataract, bilateral: Secondary | ICD-10-CM | POA: Diagnosis not present

## 2016-07-27 DIAGNOSIS — E119 Type 2 diabetes mellitus without complications: Secondary | ICD-10-CM | POA: Diagnosis not present

## 2016-07-27 DIAGNOSIS — Z794 Long term (current) use of insulin: Secondary | ICD-10-CM | POA: Diagnosis not present

## 2016-07-30 DIAGNOSIS — M79675 Pain in left toe(s): Secondary | ICD-10-CM | POA: Diagnosis not present

## 2016-07-30 DIAGNOSIS — B351 Tinea unguium: Secondary | ICD-10-CM | POA: Diagnosis not present

## 2016-07-30 DIAGNOSIS — M79674 Pain in right toe(s): Secondary | ICD-10-CM | POA: Diagnosis not present

## 2016-08-09 DIAGNOSIS — E119 Type 2 diabetes mellitus without complications: Secondary | ICD-10-CM | POA: Diagnosis not present

## 2016-08-09 DIAGNOSIS — J449 Chronic obstructive pulmonary disease, unspecified: Secondary | ICD-10-CM | POA: Diagnosis not present

## 2016-08-09 DIAGNOSIS — E7800 Pure hypercholesterolemia, unspecified: Secondary | ICD-10-CM | POA: Diagnosis not present

## 2016-08-29 DIAGNOSIS — E119 Type 2 diabetes mellitus without complications: Secondary | ICD-10-CM | POA: Diagnosis not present

## 2016-08-29 DIAGNOSIS — J449 Chronic obstructive pulmonary disease, unspecified: Secondary | ICD-10-CM | POA: Diagnosis not present

## 2016-08-29 DIAGNOSIS — E7800 Pure hypercholesterolemia, unspecified: Secondary | ICD-10-CM | POA: Diagnosis not present

## 2016-09-12 DIAGNOSIS — Z713 Dietary counseling and surveillance: Secondary | ICD-10-CM | POA: Diagnosis not present

## 2016-09-12 DIAGNOSIS — Z6830 Body mass index (BMI) 30.0-30.9, adult: Secondary | ICD-10-CM | POA: Diagnosis not present

## 2016-09-12 DIAGNOSIS — E7800 Pure hypercholesterolemia, unspecified: Secondary | ICD-10-CM | POA: Diagnosis not present

## 2016-09-12 DIAGNOSIS — J069 Acute upper respiratory infection, unspecified: Secondary | ICD-10-CM | POA: Diagnosis not present

## 2016-09-12 DIAGNOSIS — J449 Chronic obstructive pulmonary disease, unspecified: Secondary | ICD-10-CM | POA: Diagnosis not present

## 2016-09-12 DIAGNOSIS — N400 Enlarged prostate without lower urinary tract symptoms: Secondary | ICD-10-CM | POA: Diagnosis not present

## 2016-09-12 DIAGNOSIS — E1165 Type 2 diabetes mellitus with hyperglycemia: Secondary | ICD-10-CM | POA: Diagnosis not present

## 2016-09-12 DIAGNOSIS — Z299 Encounter for prophylactic measures, unspecified: Secondary | ICD-10-CM | POA: Diagnosis not present

## 2016-10-01 DIAGNOSIS — M79674 Pain in right toe(s): Secondary | ICD-10-CM | POA: Diagnosis not present

## 2016-10-01 DIAGNOSIS — M79675 Pain in left toe(s): Secondary | ICD-10-CM | POA: Diagnosis not present

## 2016-10-01 DIAGNOSIS — B351 Tinea unguium: Secondary | ICD-10-CM | POA: Diagnosis not present

## 2016-10-25 DIAGNOSIS — Z125 Encounter for screening for malignant neoplasm of prostate: Secondary | ICD-10-CM | POA: Diagnosis not present

## 2016-10-25 DIAGNOSIS — Z7189 Other specified counseling: Secondary | ICD-10-CM | POA: Diagnosis not present

## 2016-10-25 DIAGNOSIS — Z0000 Encounter for general adult medical examination without abnormal findings: Secondary | ICD-10-CM | POA: Diagnosis not present

## 2016-10-25 DIAGNOSIS — N400 Enlarged prostate without lower urinary tract symptoms: Secondary | ICD-10-CM | POA: Diagnosis not present

## 2016-10-25 DIAGNOSIS — Z1389 Encounter for screening for other disorder: Secondary | ICD-10-CM | POA: Diagnosis not present

## 2016-10-25 DIAGNOSIS — R5383 Other fatigue: Secondary | ICD-10-CM | POA: Diagnosis not present

## 2016-10-25 DIAGNOSIS — Z299 Encounter for prophylactic measures, unspecified: Secondary | ICD-10-CM | POA: Diagnosis not present

## 2016-10-25 DIAGNOSIS — E7800 Pure hypercholesterolemia, unspecified: Secondary | ICD-10-CM | POA: Diagnosis not present

## 2016-10-25 DIAGNOSIS — E1165 Type 2 diabetes mellitus with hyperglycemia: Secondary | ICD-10-CM | POA: Diagnosis not present

## 2016-10-25 DIAGNOSIS — Z1211 Encounter for screening for malignant neoplasm of colon: Secondary | ICD-10-CM | POA: Diagnosis not present

## 2016-10-25 DIAGNOSIS — Z79899 Other long term (current) drug therapy: Secondary | ICD-10-CM | POA: Diagnosis not present

## 2016-10-25 DIAGNOSIS — F329 Major depressive disorder, single episode, unspecified: Secondary | ICD-10-CM | POA: Diagnosis not present

## 2016-10-25 DIAGNOSIS — Z6830 Body mass index (BMI) 30.0-30.9, adult: Secondary | ICD-10-CM | POA: Diagnosis not present

## 2016-10-25 DIAGNOSIS — J449 Chronic obstructive pulmonary disease, unspecified: Secondary | ICD-10-CM | POA: Diagnosis not present

## 2016-12-07 DIAGNOSIS — J449 Chronic obstructive pulmonary disease, unspecified: Secondary | ICD-10-CM | POA: Diagnosis not present

## 2016-12-07 DIAGNOSIS — E119 Type 2 diabetes mellitus without complications: Secondary | ICD-10-CM | POA: Diagnosis not present

## 2016-12-07 DIAGNOSIS — E7800 Pure hypercholesterolemia, unspecified: Secondary | ICD-10-CM | POA: Diagnosis not present

## 2016-12-17 DIAGNOSIS — M79674 Pain in right toe(s): Secondary | ICD-10-CM | POA: Diagnosis not present

## 2016-12-17 DIAGNOSIS — M79675 Pain in left toe(s): Secondary | ICD-10-CM | POA: Diagnosis not present

## 2016-12-17 DIAGNOSIS — B351 Tinea unguium: Secondary | ICD-10-CM | POA: Diagnosis not present

## 2016-12-19 DIAGNOSIS — Z6830 Body mass index (BMI) 30.0-30.9, adult: Secondary | ICD-10-CM | POA: Diagnosis not present

## 2016-12-19 DIAGNOSIS — F329 Major depressive disorder, single episode, unspecified: Secondary | ICD-10-CM | POA: Diagnosis not present

## 2016-12-19 DIAGNOSIS — E7800 Pure hypercholesterolemia, unspecified: Secondary | ICD-10-CM | POA: Diagnosis not present

## 2016-12-19 DIAGNOSIS — E1165 Type 2 diabetes mellitus with hyperglycemia: Secondary | ICD-10-CM | POA: Diagnosis not present

## 2016-12-19 DIAGNOSIS — N400 Enlarged prostate without lower urinary tract symptoms: Secondary | ICD-10-CM | POA: Diagnosis not present

## 2016-12-19 DIAGNOSIS — J449 Chronic obstructive pulmonary disease, unspecified: Secondary | ICD-10-CM | POA: Diagnosis not present

## 2016-12-19 DIAGNOSIS — Z299 Encounter for prophylactic measures, unspecified: Secondary | ICD-10-CM | POA: Diagnosis not present

## 2016-12-24 DIAGNOSIS — E7800 Pure hypercholesterolemia, unspecified: Secondary | ICD-10-CM | POA: Diagnosis not present

## 2016-12-24 DIAGNOSIS — J449 Chronic obstructive pulmonary disease, unspecified: Secondary | ICD-10-CM | POA: Diagnosis not present

## 2016-12-24 DIAGNOSIS — E119 Type 2 diabetes mellitus without complications: Secondary | ICD-10-CM | POA: Diagnosis not present

## 2017-01-18 DIAGNOSIS — Z23 Encounter for immunization: Secondary | ICD-10-CM | POA: Diagnosis not present

## 2017-02-07 DIAGNOSIS — Z7984 Long term (current) use of oral hypoglycemic drugs: Secondary | ICD-10-CM | POA: Diagnosis not present

## 2017-02-07 DIAGNOSIS — Z794 Long term (current) use of insulin: Secondary | ICD-10-CM | POA: Diagnosis not present

## 2017-02-07 DIAGNOSIS — H25813 Combined forms of age-related cataract, bilateral: Secondary | ICD-10-CM | POA: Diagnosis not present

## 2017-02-07 DIAGNOSIS — E119 Type 2 diabetes mellitus without complications: Secondary | ICD-10-CM | POA: Diagnosis not present

## 2017-02-18 DIAGNOSIS — E119 Type 2 diabetes mellitus without complications: Secondary | ICD-10-CM | POA: Diagnosis not present

## 2017-02-18 DIAGNOSIS — J449 Chronic obstructive pulmonary disease, unspecified: Secondary | ICD-10-CM | POA: Diagnosis not present

## 2017-02-18 DIAGNOSIS — E7800 Pure hypercholesterolemia, unspecified: Secondary | ICD-10-CM | POA: Diagnosis not present

## 2017-02-25 DIAGNOSIS — M79674 Pain in right toe(s): Secondary | ICD-10-CM | POA: Diagnosis not present

## 2017-02-25 DIAGNOSIS — M79675 Pain in left toe(s): Secondary | ICD-10-CM | POA: Diagnosis not present

## 2017-02-25 DIAGNOSIS — B351 Tinea unguium: Secondary | ICD-10-CM | POA: Diagnosis not present

## 2017-03-01 DIAGNOSIS — E7800 Pure hypercholesterolemia, unspecified: Secondary | ICD-10-CM | POA: Diagnosis not present

## 2017-03-01 DIAGNOSIS — J449 Chronic obstructive pulmonary disease, unspecified: Secondary | ICD-10-CM | POA: Diagnosis not present

## 2017-03-01 DIAGNOSIS — E119 Type 2 diabetes mellitus without complications: Secondary | ICD-10-CM | POA: Diagnosis not present

## 2017-03-25 DIAGNOSIS — Z299 Encounter for prophylactic measures, unspecified: Secondary | ICD-10-CM | POA: Diagnosis not present

## 2017-03-25 DIAGNOSIS — J449 Chronic obstructive pulmonary disease, unspecified: Secondary | ICD-10-CM | POA: Diagnosis not present

## 2017-03-25 DIAGNOSIS — Z6830 Body mass index (BMI) 30.0-30.9, adult: Secondary | ICD-10-CM | POA: Diagnosis not present

## 2017-03-25 DIAGNOSIS — M1990 Unspecified osteoarthritis, unspecified site: Secondary | ICD-10-CM | POA: Diagnosis not present

## 2017-03-25 DIAGNOSIS — E1165 Type 2 diabetes mellitus with hyperglycemia: Secondary | ICD-10-CM | POA: Diagnosis not present

## 2017-03-27 DIAGNOSIS — J449 Chronic obstructive pulmonary disease, unspecified: Secondary | ICD-10-CM | POA: Diagnosis not present

## 2017-03-27 DIAGNOSIS — E7800 Pure hypercholesterolemia, unspecified: Secondary | ICD-10-CM | POA: Diagnosis not present

## 2017-03-27 DIAGNOSIS — E119 Type 2 diabetes mellitus without complications: Secondary | ICD-10-CM | POA: Diagnosis not present

## 2017-05-09 DIAGNOSIS — E7800 Pure hypercholesterolemia, unspecified: Secondary | ICD-10-CM | POA: Diagnosis not present

## 2017-05-09 DIAGNOSIS — E119 Type 2 diabetes mellitus without complications: Secondary | ICD-10-CM | POA: Diagnosis not present

## 2017-05-09 DIAGNOSIS — J449 Chronic obstructive pulmonary disease, unspecified: Secondary | ICD-10-CM | POA: Diagnosis not present

## 2017-05-14 DIAGNOSIS — B351 Tinea unguium: Secondary | ICD-10-CM | POA: Diagnosis not present

## 2017-05-14 DIAGNOSIS — M79674 Pain in right toe(s): Secondary | ICD-10-CM | POA: Diagnosis not present

## 2017-05-14 DIAGNOSIS — M79675 Pain in left toe(s): Secondary | ICD-10-CM | POA: Diagnosis not present

## 2017-05-27 DIAGNOSIS — J449 Chronic obstructive pulmonary disease, unspecified: Secondary | ICD-10-CM | POA: Diagnosis not present

## 2017-05-27 DIAGNOSIS — E119 Type 2 diabetes mellitus without complications: Secondary | ICD-10-CM | POA: Diagnosis not present

## 2017-05-27 DIAGNOSIS — E7800 Pure hypercholesterolemia, unspecified: Secondary | ICD-10-CM | POA: Diagnosis not present

## 2017-07-04 ENCOUNTER — Encounter (HOSPITAL_COMMUNITY): Payer: Self-pay | Admitting: *Deleted

## 2017-07-04 ENCOUNTER — Observation Stay (HOSPITAL_COMMUNITY)
Admission: EM | Admit: 2017-07-04 | Discharge: 2017-07-05 | Disposition: A | Discharge status: Home / Self Care | Payer: Medicare Other | Attending: Internal Medicine | Admitting: Internal Medicine

## 2017-07-04 ENCOUNTER — Other Ambulatory Visit: Payer: Self-pay

## 2017-07-04 ENCOUNTER — Emergency Department (HOSPITAL_COMMUNITY): Payer: Medicare Other

## 2017-07-04 DIAGNOSIS — J449 Chronic obstructive pulmonary disease, unspecified: Secondary | ICD-10-CM | POA: Diagnosis not present

## 2017-07-04 DIAGNOSIS — E1122 Type 2 diabetes mellitus with diabetic chronic kidney disease: Secondary | ICD-10-CM | POA: Diagnosis not present

## 2017-07-04 DIAGNOSIS — F329 Major depressive disorder, single episode, unspecified: Secondary | ICD-10-CM | POA: Diagnosis present

## 2017-07-04 DIAGNOSIS — R42 Dizziness and giddiness: Secondary | ICD-10-CM | POA: Diagnosis present

## 2017-07-04 DIAGNOSIS — N183 Chronic kidney disease, stage 3 (moderate): Secondary | ICD-10-CM | POA: Insufficient documentation

## 2017-07-04 DIAGNOSIS — E785 Hyperlipidemia, unspecified: Secondary | ICD-10-CM | POA: Insufficient documentation

## 2017-07-04 DIAGNOSIS — N179 Acute kidney failure, unspecified: Secondary | ICD-10-CM | POA: Diagnosis not present

## 2017-07-04 DIAGNOSIS — Z79899 Other long term (current) drug therapy: Secondary | ICD-10-CM | POA: Insufficient documentation

## 2017-07-04 DIAGNOSIS — R41841 Cognitive communication deficit: Secondary | ICD-10-CM | POA: Insufficient documentation

## 2017-07-04 DIAGNOSIS — F32A Depression, unspecified: Secondary | ICD-10-CM | POA: Diagnosis present

## 2017-07-04 DIAGNOSIS — K219 Gastro-esophageal reflux disease without esophagitis: Secondary | ICD-10-CM | POA: Diagnosis not present

## 2017-07-04 DIAGNOSIS — R404 Transient alteration of awareness: Secondary | ICD-10-CM | POA: Diagnosis not present

## 2017-07-04 DIAGNOSIS — F17290 Nicotine dependence, other tobacco product, uncomplicated: Secondary | ICD-10-CM | POA: Diagnosis not present

## 2017-07-04 DIAGNOSIS — G459 Transient cerebral ischemic attack, unspecified: Secondary | ICD-10-CM | POA: Diagnosis not present

## 2017-07-04 DIAGNOSIS — E782 Mixed hyperlipidemia: Secondary | ICD-10-CM | POA: Diagnosis present

## 2017-07-04 DIAGNOSIS — Z794 Long term (current) use of insulin: Secondary | ICD-10-CM | POA: Insufficient documentation

## 2017-07-04 DIAGNOSIS — E1169 Type 2 diabetes mellitus with other specified complication: Secondary | ICD-10-CM | POA: Diagnosis present

## 2017-07-04 DIAGNOSIS — Z7982 Long term (current) use of aspirin: Secondary | ICD-10-CM | POA: Insufficient documentation

## 2017-07-04 DIAGNOSIS — E119 Type 2 diabetes mellitus without complications: Secondary | ICD-10-CM

## 2017-07-04 DIAGNOSIS — N1831 Chronic kidney disease, stage 3a: Secondary | ICD-10-CM | POA: Diagnosis present

## 2017-07-04 HISTORY — DX: Anemia in chronic kidney disease: D63.1

## 2017-07-04 HISTORY — DX: Hyperlipidemia, unspecified: E78.5

## 2017-07-04 HISTORY — DX: Chronic kidney disease, stage 3 unspecified: N18.30

## 2017-07-04 HISTORY — DX: Chronic kidney disease, stage 3 (moderate): N18.3

## 2017-07-04 LAB — DIFFERENTIAL
BASOS ABS: 0 10*3/uL (ref 0.0–0.1)
BASOS PCT: 0 %
EOS ABS: 0.2 10*3/uL (ref 0.0–0.7)
EOS PCT: 3 %
Lymphocytes Relative: 9 %
Lymphs Abs: 0.6 10*3/uL — ABNORMAL LOW (ref 0.7–4.0)
MONO ABS: 0.7 10*3/uL (ref 0.1–1.0)
MONOS PCT: 11 %
Neutro Abs: 5.1 10*3/uL (ref 1.7–7.7)
Neutrophils Relative %: 77 %

## 2017-07-04 LAB — COMPREHENSIVE METABOLIC PANEL
ALT: 24 U/L (ref 17–63)
ANION GAP: 10 (ref 5–15)
AST: 23 U/L (ref 15–41)
Albumin: 3.7 g/dL (ref 3.5–5.0)
Alkaline Phosphatase: 30 U/L — ABNORMAL LOW (ref 38–126)
BILIRUBIN TOTAL: 0.3 mg/dL (ref 0.3–1.2)
BUN: 28 mg/dL — ABNORMAL HIGH (ref 6–20)
CO2: 25 mmol/L (ref 22–32)
Calcium: 9.3 mg/dL (ref 8.9–10.3)
Chloride: 103 mmol/L (ref 101–111)
Creatinine, Ser: 1.72 mg/dL — ABNORMAL HIGH (ref 0.61–1.24)
GFR calc non Af Amer: 39 mL/min — ABNORMAL LOW (ref >60)
GFR, EST AFRICAN AMERICAN: 45 mL/min — AB (ref >60)
Glucose, Bld: 105 mg/dL — ABNORMAL HIGH (ref 65–99)
POTASSIUM: 4.7 mmol/L (ref 3.5–5.1)
Sodium: 138 mmol/L (ref 135–145)
TOTAL PROTEIN: 6.9 g/dL (ref 6.5–8.1)

## 2017-07-04 LAB — CBC
HCT: 30.4 % — ABNORMAL LOW (ref 39.0–52.0)
Hemoglobin: 9.6 g/dL — ABNORMAL LOW (ref 13.0–17.0)
MCH: 26.6 pg (ref 26.0–34.0)
MCHC: 31.6 g/dL (ref 30.0–36.0)
MCV: 84.2 fL (ref 78.0–100.0)
Platelets: 247 10*3/uL (ref 150–400)
RBC: 3.61 MIL/uL — ABNORMAL LOW (ref 4.22–5.81)
RDW: 16.3 % — AB (ref 11.5–15.5)
WBC: 6.6 10*3/uL (ref 4.0–10.5)

## 2017-07-04 LAB — GLUCOSE, CAPILLARY: Glucose-Capillary: 102 mg/dL — ABNORMAL HIGH (ref 65–99)

## 2017-07-04 LAB — URINALYSIS, ROUTINE W REFLEX MICROSCOPIC
Bilirubin Urine: NEGATIVE
GLUCOSE, UA: NEGATIVE mg/dL
HGB URINE DIPSTICK: NEGATIVE
Ketones, ur: NEGATIVE mg/dL
LEUKOCYTES UA: NEGATIVE
Nitrite: NEGATIVE
PROTEIN: NEGATIVE mg/dL
SPECIFIC GRAVITY, URINE: 1.015 (ref 1.005–1.030)
pH: 5 (ref 5.0–8.0)

## 2017-07-04 LAB — RAPID URINE DRUG SCREEN, HOSP PERFORMED
Amphetamines: NOT DETECTED
Barbiturates: NOT DETECTED
Benzodiazepines: NOT DETECTED
COCAINE: NOT DETECTED
OPIATES: NOT DETECTED
Tetrahydrocannabinol: NOT DETECTED

## 2017-07-04 LAB — ETHANOL

## 2017-07-04 LAB — I-STAT TROPONIN, ED: TROPONIN I, POC: 0 ng/mL (ref 0.00–0.08)

## 2017-07-04 LAB — PROTIME-INR
INR: 0.94
Prothrombin Time: 12.5 seconds (ref 11.4–15.2)

## 2017-07-04 LAB — APTT: APTT: 31 s (ref 24–36)

## 2017-07-04 MED ORDER — FLUTICASONE PROPIONATE 50 MCG/ACT NA SUSP
2.0000 | Freq: Every day | NASAL | Status: DC
  Administered 2017-07-05: 2 via NASAL
  Filled 2017-07-04: qty 16

## 2017-07-04 MED ORDER — ENOXAPARIN SODIUM 40 MG/0.4ML ~~LOC~~ SOLN: 40.0000 mg | SUBCUTANEOUS | Status: DC

## 2017-07-04 MED ORDER — ENOXAPARIN SODIUM 40 MG/0.4ML ~~LOC~~ SOLN
40.0000 mg | SUBCUTANEOUS | Status: DC
  Administered 2017-07-04: 40 mg via SUBCUTANEOUS
  Filled 2017-07-04: qty 0.4

## 2017-07-04 MED ORDER — LAMOTRIGINE 100 MG PO TABS
100.0000 mg | ORAL_TABLET | Freq: Every day | ORAL | Status: DC
  Administered 2017-07-05: 100 mg via ORAL
  Filled 2017-07-04: qty 1

## 2017-07-04 MED ORDER — ATORVASTATIN CALCIUM 10 MG PO TABS
10.0000 mg | ORAL_TABLET | Freq: Every day | ORAL | Status: DC
  Administered 2017-07-05: 10 mg via ORAL
  Filled 2017-07-04: qty 1

## 2017-07-04 MED ORDER — ASPIRIN EC 81 MG PO TBEC
81.0000 mg | DELAYED_RELEASE_TABLET | Freq: Every day | ORAL | Status: DC
  Administered 2017-07-05: 81 mg via ORAL
  Filled 2017-07-04: qty 1

## 2017-07-04 MED ORDER — CITALOPRAM HYDROBROMIDE 20 MG PO TABS
20.0000 mg | ORAL_TABLET | Freq: Every day | ORAL | Status: DC
  Administered 2017-07-04: 20 mg via ORAL
  Filled 2017-07-04: qty 1

## 2017-07-04 MED ORDER — LISINOPRIL 5 MG PO TABS
2.5000 mg | ORAL_TABLET | Freq: Every day | ORAL | Status: DC
  Administered 2017-07-05: 2.5 mg via ORAL
  Filled 2017-07-04: qty 1

## 2017-07-04 MED ORDER — LINAGLIPTIN 5 MG PO TABS
5.0000 mg | ORAL_TABLET | Freq: Every day | ORAL | Status: DC
  Administered 2017-07-05: 5 mg via ORAL
  Filled 2017-07-04: qty 1

## 2017-07-04 MED ORDER — ACETAMINOPHEN 160 MG/5ML PO SOLN: 650.0000 mg | ORAL | Status: DC | PRN

## 2017-07-04 MED ORDER — VITAMIN D 1000 UNITS PO TABS
2000.0000 [IU] | ORAL_TABLET | Freq: Every day | ORAL | Status: DC
  Administered 2017-07-05: 2000 [IU] via ORAL
  Filled 2017-07-04 (×2): qty 2

## 2017-07-04 MED ORDER — IPRATROPIUM-ALBUTEROL 0.5-2.5 (3) MG/3ML IN SOLN
3.0000 mL | Freq: Four times a day (QID) | RESPIRATORY_TRACT | Status: DC
  Administered 2017-07-04: 3 mL via RESPIRATORY_TRACT
  Filled 2017-07-04: qty 3

## 2017-07-04 MED ORDER — INSULIN ASPART 100 UNIT/ML ~~LOC~~ SOLN
0.0000 [IU] | Freq: Three times a day (TID) | SUBCUTANEOUS | Status: DC
  Administered 2017-07-05: 3 [IU] via SUBCUTANEOUS

## 2017-07-04 MED ORDER — METFORMIN HCL 500 MG PO TABS
1000.0000 mg | ORAL_TABLET | Freq: Two times a day (BID) | ORAL | Status: DC
  Filled 2017-07-04: qty 2

## 2017-07-04 MED ORDER — RISPERIDONE 0.5 MG PO TABS
0.5000 mg | ORAL_TABLET | Freq: Every day | ORAL | Status: DC
  Administered 2017-07-04: 0.5 mg via ORAL
  Filled 2017-07-04: qty 1

## 2017-07-04 MED ORDER — ALBUTEROL SULFATE (2.5 MG/3ML) 0.083% IN NEBU: 2.5000 mg | INHALATION_SOLUTION | Freq: Four times a day (QID) | RESPIRATORY_TRACT | Status: DC | PRN

## 2017-07-04 MED ORDER — PANTOPRAZOLE SODIUM 40 MG PO TBEC
40.0000 mg | DELAYED_RELEASE_TABLET | Freq: Every day | ORAL | Status: DC
  Administered 2017-07-05: 40 mg via ORAL
  Filled 2017-07-04: qty 1

## 2017-07-04 MED ORDER — IPRATROPIUM-ALBUTEROL 0.5-2.5 (3) MG/3ML IN SOLN
3.0000 mL | Freq: Two times a day (BID) | RESPIRATORY_TRACT | Status: DC
  Administered 2017-07-05: 3 mL via RESPIRATORY_TRACT
  Filled 2017-07-04: qty 3

## 2017-07-04 MED ORDER — TRAZODONE HCL 50 MG PO TABS
100.0000 mg | ORAL_TABLET | Freq: Every day | ORAL | Status: DC
  Administered 2017-07-04: 100 mg via ORAL
  Filled 2017-07-04: qty 2

## 2017-07-04 MED ORDER — IPRATROPIUM-ALBUTEROL 20-100 MCG/ACT IN AERS: 2.0000 | INHALATION_SPRAY | Freq: Two times a day (BID) | RESPIRATORY_TRACT | Status: DC

## 2017-07-04 MED ORDER — SODIUM CHLORIDE 0.9 % IV BOLUS
1000.0000 mL | Freq: Once | INTRAVENOUS | Status: AC
  Administered 2017-07-04: 1000 mL via INTRAVENOUS

## 2017-07-04 MED ORDER — INSULIN GLARGINE 100 UNIT/ML ~~LOC~~ SOLN
76.0000 [IU] | Freq: Every day | SUBCUTANEOUS | Status: DC
  Administered 2017-07-04: 76 [IU] via SUBCUTANEOUS
  Filled 2017-07-04 (×4): qty 0.76

## 2017-07-04 MED ORDER — ENSURE ENLIVE PO LIQD
237.0000 mL | Freq: Two times a day (BID) | ORAL | Status: DC
  Administered 2017-07-05: 237 mL via ORAL

## 2017-07-04 MED ORDER — MECLIZINE HCL 12.5 MG PO TABS
12.5000 mg | ORAL_TABLET | Freq: Once | ORAL | Status: AC
  Administered 2017-07-04: 12.5 mg via ORAL
  Filled 2017-07-04: qty 1

## 2017-07-04 MED ORDER — TAMSULOSIN HCL 0.4 MG PO CAPS
0.4000 mg | ORAL_CAPSULE | Freq: Every day | ORAL | Status: DC
  Administered 2017-07-05: 0.4 mg via ORAL
  Filled 2017-07-04: qty 1

## 2017-07-04 MED ORDER — ACETAMINOPHEN 650 MG RE SUPP: 650.0000 mg | RECTAL | Status: DC | PRN

## 2017-07-04 MED ORDER — ACETAMINOPHEN 325 MG PO TABS
650.0000 mg | ORAL_TABLET | ORAL | Status: DC | PRN
  Administered 2017-07-05: 650 mg via ORAL
  Filled 2017-07-04: qty 2

## 2017-07-04 MED ORDER — LORATADINE 10 MG PO TABS
10.0000 mg | ORAL_TABLET | Freq: Every day | ORAL | Status: DC
  Administered 2017-07-05: 10 mg via ORAL
  Filled 2017-07-04: qty 1

## 2017-07-04 MED ORDER — STROKE: EARLY STAGES OF RECOVERY BOOK
Freq: Once | Status: AC
  Administered 2017-07-04: 23:00:00
  Filled 2017-07-04: qty 1

## 2017-07-04 NOTE — ED Triage Notes (Signed)
Pt brought in by [facility] from [facility] with c/o dizziness that started this morning. This afternoon pt started having right arm weakness, denies numbness. When EMS arrived, no signs of stroke were present. CBG 104, BP 120/80, HR 104 for EMS. Grips are equal, no arm or leg drift. Smile symmetrical.

## 2017-07-04 NOTE — ED Notes (Signed)
EDP made aware of pt's symptoms.

## 2017-07-04 NOTE — ED Provider Notes (Signed)
Emergency Department Provider Note   I have reviewed the triage vital signs and the nursing notes.   HISTORY  Chief Complaint Dizziness   HPI Douglas Cook is a 72 y.o. male with PMH of COPD, DM, and anemia resents to the emergency department for evaluation of "dizziness" and right arm weakness starting at 1 AM today.  Patient states he awoke with these symptoms and proceeded to try and get to the bathroom to have a bowel movement.  He ultimately had a bowel movement on the floor and staff came to help him get cleaned up.  He tried going back to sleep but continued to have dizziness and some weakness this morning.  He states that his walking has been "like a snail pace" and he continues to feel off balance like he may fall. Denies specific vertigo.  Denies any fevers or chills.  No associated vomiting.  No additional bowel movements.  No history of stroke.  No known medication changes. No radiation of symptoms.   Past Medical History:  Diagnosis Date  . 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 Arthritis   . CKD (chronic kidney disease), stage III (HCC)   . COPD (chronic obstructive pulmonary disease) (HCC)   . Depression   . Diabetes mellitus without complication (HCC)   . Fatigue   . Hyperlipidemia   . Reflux     Patient Active Problem List   Diagnosis Date Noted  . TIA (transient ischemic attack) 07/04/2017  . CKD (chronic kidney disease), stage III (HCC) 07/04/2017  . COPD (chronic obstructive pulmonary disease) (HCC) 07/04/2017  . Type 2 diabetes mellitus (HCC) 07/04/2017  . GERD (gastroesophageal reflux disease) 07/04/2017  . Hyperlipidemia 07/04/2017  . Depression 07/04/2017    History reviewed. No pertinent surgical history.    Allergies Patient has no known allergies.  Family History  Problem Relation Age of Onset  . Stroke Mother   . Diabetes Father     Social History Social History   Tobacco Use  . Smoking status: Current  Every Day Smoker    Types: Pipe  . Smokeless tobacco: Never Used  Substance Use Topics  . Alcohol use: No  . Drug use: No    Review of Systems  Constitutional: No fever/chills Eyes: No visual changes. ENT: No sore throat. Cardiovascular: Denies chest pain. Respiratory: Denies shortness of breath. Gastrointestinal: No abdominal pain. No nausea, no vomiting. No diarrhea. No constipation. Genitourinary: Negative for dysuria. Musculoskeletal: Negative for back pain. Skin: Negative for rash. Neurological: Negative for headaches, focal weakness or numbness.  10-point ROS otherwise negative.  ____________________________________________   PHYSICAL EXAM:  VITAL SIGNS: ED Triage Vitals [07/04/17 1657]  Enc Vitals Group     BP (!) 116/58     Pulse Rate (!) 106     Resp (!) 24     Temp 98.9 F (37.2 C)     Temp Source Oral     SpO2 96 %     Weight 220 lb (99.8 kg)     Height 5\' 11"  (1.803 m)     Pain Score 0   Constitutional: Alert and oriented. Well appearing and in no acute distress. Eyes: Conjunctivae are normal. Head: Atraumatic. Nose: No congestion/rhinnorhea. Mouth/Throat: Mucous membranes are moist.   Neck: No stridor.  Cardiovascular: Normal rate, regular rhythm. Good peripheral circulation. Grossly normal heart sounds.   Respiratory: Normal respiratory effort.  No retractions. Lungs CTAB. Gastrointestinal: Soft and nontender. No distention.  Musculoskeletal: No lower extremity tenderness nor edema. No gross deformities of extremities. Neurologic:  Normal speech and language. No gross focal neurologic deficits are appreciated. Normal CN exam 2-12. Normal finger-to-nose testing.  Skin:  Skin is warm, dry and intact. No rash noted.  ____________________________________________   LABS (all labs ordered are listed, but only abnormal results are displayed)  Labs Reviewed  MRSA PCR SCREENING - Abnormal; Notable for the following components:      Result Value    MRSA by PCR POSITIVE (*)    All other components within normal limits  CBC - Abnormal; Notable for the following components:   RBC 3.61 (*)    Hemoglobin 9.6 (*)    HCT 30.4 (*)    RDW 16.3 (*)    All other components within normal limits  DIFFERENTIAL - Abnormal; Notable for the following components:   Lymphs Abs 0.6 (*)    All other components within normal limits  COMPREHENSIVE METABOLIC PANEL - Abnormal; Notable for the following components:   Glucose, Bld 105 (*)    BUN 28 (*)    Creatinine, Ser 1.72 (*)    Alkaline Phosphatase 30 (*)    GFR calc non Af Amer 39 (*)    GFR calc Af Amer 45 (*)    All other components within normal limits  LIPID PANEL - Abnormal; Notable for the following components:   HDL 30 (*)    All other components within normal limits  GLUCOSE, CAPILLARY - Abnormal; Notable for the following components:   Glucose-Capillary 102 (*)    All other components within normal limits  CBC WITH DIFFERENTIAL/PLATELET - Abnormal; Notable for the following components:   RBC 3.42 (*)    Hemoglobin 8.9 (*)    HCT 28.8 (*)    RDW 16.5 (*)    All other components within normal limits  COMPREHENSIVE METABOLIC PANEL - Abnormal; Notable for the following components:   BUN 21 (*)    Creatinine, Ser 1.53 (*)    Calcium 8.5 (*)    Total Protein 6.1 (*)    Albumin 3.1 (*)    Alkaline Phosphatase 23 (*)    GFR calc non Af Amer 45 (*)    GFR calc Af Amer 52 (*)    All other components within normal limits  GLUCOSE, CAPILLARY - Abnormal; Notable for the following components:   Glucose-Capillary 56 (*)    All other components within normal limits  ETHANOL  PROTIME-INR  APTT  RAPID URINE DRUG SCREEN, HOSP PERFORMED  URINALYSIS, ROUTINE W REFLEX MICROSCOPIC  LACTIC ACID, PLASMA  HEMOGLOBIN A1C  LACTIC ACID, PLASMA  URINALYSIS, ROUTINE W REFLEX MICROSCOPIC  I-STAT TROPONIN, ED   ____________________________________________  EKG   EKG  Interpretation  Date/Time:  Thursday July 04 2017 17:10:14 EDT Ventricular Rate:  100 PR Interval:    QRS Duration: 81 QT Interval:  337 QTC Calculation: 435 R Axis:   85 Text Interpretation:  Sinus tachycardia Borderline right axis deviation Baseline wander in lead(s) V6 No STEMI.  Confirmed by Alona Bene [phone]) on 07/04/2017 5:16:47 PM       ____________________________________________  RADIOLOGY  Dg Chest 2 View  Result Date: 07/04/2017 CLINICAL DATA:  72 y/o M; dizziness at started this morning. Right arm weakness. EXAM: CHEST - 2 VIEW COMPARISON:  11/21/2014 chest radiograph FINDINGS: Stable heart size and mediastinal contours are within normal limits. Both lungs are clear. The visualized skeletal structures are unremarkable. IMPRESSION: No active cardiopulmonary disease. Electronically Signed  By: Mitzi HansenLance  Furusawa-Stratton M.D.   On: 07/04/2017 18:17   Ct Head Wo Contrast  Result Date: 07/04/2017 CLINICAL DATA:  Dizziness and right arm weakness. EXAM: CT HEAD WITHOUT CONTRAST TECHNIQUE: Contiguous axial images were obtained from the base of the skull through the vertex without intravenous contrast. COMPARISON:  None. FINDINGS: Brain: Slight age advanced cerebral atrophy, ventriculomegaly and periventricular white matter disease. No CT findings for acute hemispheric infarction or intracranial hemorrhage. No mass lesions are identified. No extra-axial fluid collections. Small basal ganglia calcifications are on the left. The brainstem and cerebellum are grossly normal. Vascular: No hyperdense vessel or unexpected calcification. Skull: No skull fracture or bone lesions. Sinuses/Orbits: The paranasal sinuses and mastoid air cells are clear. Other: No scalp lesions or hematoma. IMPRESSION: Slight age advanced cerebral atrophy, ventriculomegaly and periventricular white matter disease. No acute intracranial findings or mass lesions. Electronically Signed   By: Rudie MeyerP.  Gallerani M.D.   On:  07/04/2017 18:54   Mr Brain Wo Contrast  Result Date: 07/05/2017 CLINICAL DATA:  72 year old male with dizziness and right arm weakness. EXAM: MRI HEAD WITHOUT CONTRAST MRA HEAD WITHOUT CONTRAST TECHNIQUE: Multiplanar, multiecho pulse sequences of the brain and surrounding structures were obtained without intravenous contrast. Angiographic images of the head were obtained using MRA technique without contrast. COMPARISON:  Head CT without contrast 07/04/2017. FINDINGS: MRI HEAD FINDINGS Brain: No restricted diffusion to suggest acute infarction. No midline shift, mass effect, evidence of mass lesion, ventriculomegaly, extra-axial collection or acute intracranial hemorrhage. Cervicomedullary junction and pituitary are within normal limits. Confluent periventricular and other scattered and patchy bilateral cerebral white matter T2 and FLAIR hyperintensity is moderate for age but in a nonspecific configuration. No cortical encephalomalacia or chronic cerebral blood products are identified. Mild for age signal heterogeneity in the deep gray matter nuclei and pons. Negative cerebellum. Vascular: Major intracranial vascular flow voids are preserved. Skull and upper cervical spine: Negative visible cervical spine. Normal bone marrow signal. Sinuses/Orbits: Normal orbits soft tissues. Trace paranasal sinus mucosal thickening. Other: Mastoid air cells are clear. Visible internal auditory structures appear normal. Scalp and face soft tissues appear negative. MRA HEAD FINDINGS Antegrade flow in the posterior circulation with codominant distal vertebral arteries. No distal vertebral stenosis. Normal right PICA origin. The left AICA is dominant. Patent vertebrobasilar junction and basilar artery without stenosis. Normal SCA and PCA origins. Posterior communicating arteries are diminutive or absent. Bilateral PCA branches are within normal limits. Antegrade flow in both ICA siphons. Tortuous distal cervical left ICA just below  the skull base. No siphon stenosis. Ophthalmic artery origins are normal. Patent carotid termini. Normal MCA and ACA origins. Anterior communicating artery and bilateral ACA branches are within normal limits. Early left MCA branching, normal variant. The left M1 and left MCA branches appear within normal limits. The right MCA M1 segment, bifurcation, and visible right MCA branches appear normal. IMPRESSION: 1.  No acute intracranial abnormality. 2. Moderate for age nonspecific signal changes primarily in the cerebral white matter, most commonly due to chronic small vessel disease. 3.  Negative intracranial MRA. Electronically Signed   By: Odessa FlemingH  Hall M.D.   On: 07/05/2017 08:28   [place] Carotid Bilateral (at [facility] And [facility] Only)  Result Date: 07/05/2017 CLINICAL DATA:  TIA. Dizziness. Right arm weakness for the past day. EXAM: BILATERAL CAROTID DUPLEX ULTRASOUND TECHNIQUE: Wallace CullensGray scale imaging, color Doppler and duplex ultrasound were performed of bilateral carotid and vertebral arteries in the neck. COMPARISON:  None. FINDINGS: Criteria: Quantification of carotid stenosis  is based on velocity parameters that correlate the residual internal carotid diameter with NASCET-based stenosis levels, using the diameter of the distal internal carotid lumen as the denominator for stenosis measurement. The following velocity measurements were obtained: RIGHT ICA:  118/41 cm/sec CCA:  87/15 cm/sec SYSTOLIC ICA/CCA RATIO:  1.4 DIASTOLIC ICA/CCA RATIO:  2.7 ECA:  110 cm/sec LEFT ICA:  100/38 cm/sec CCA:  116/24 cm/sec SYSTOLIC ICA/CCA RATIO:  0.9 DIASTOLIC ICA/CCA RATIO:  1.6 ECA:  118 cm/sec RIGHT CAROTID ARTERY: There is a minimal amount of mixed echogenic plaque within the right carotid bulb (images 16), extending to involve the origin and proximal aspects of the right internal carotid artery (image 24), not resulting in elevated peak systolic velocities within the interrogated course the right internal carotid artery to suggest a  hemodynamically significant stenosis RIGHT VERTEBRAL ARTERY:  Antegrade flow LEFT CAROTID ARTERY: There is a minimal amount of mixed echogenic plaque within the left carotid bulb (image 50), extending to involve the origin and proximal aspect the left internal carotid artery (image 58), not resulting in elevated peak systolic velocities within the interrogated course the left internal carotid artery to suggest a hemodynamically significant stenosis. LEFT VERTEBRAL ARTERY:  Antegrade flow IMPRESSION: Minimal amount of bilateral atherosclerotic plaque, right subjectively greater than left, not resulting in a hemodynamically significant stenosis within either internal carotid artery. Electronically Signed   By: Simonne Come M.D.   On: 07/05/2017 08:28   Mr Maxine Glenn Head/brain ZO Cm  Result Date: 07/05/2017 CLINICAL DATA:  72 year old male with dizziness and right arm weakness. EXAM: MRI HEAD WITHOUT CONTRAST MRA HEAD WITHOUT CONTRAST TECHNIQUE: Multiplanar, multiecho pulse sequences of the brain and surrounding structures were obtained without intravenous contrast. Angiographic images of the head were obtained using MRA technique without contrast. COMPARISON:  Head CT without contrast 07/04/2017. FINDINGS: MRI HEAD FINDINGS Brain: No restricted diffusion to suggest acute infarction. No midline shift, mass effect, evidence of mass lesion, ventriculomegaly, extra-axial collection or acute intracranial hemorrhage. Cervicomedullary junction and pituitary are within normal limits. Confluent periventricular and other scattered and patchy bilateral cerebral white matter T2 and FLAIR hyperintensity is moderate for age but in a nonspecific configuration. No cortical encephalomalacia or chronic cerebral blood products are identified. Mild for age signal heterogeneity in the deep gray matter nuclei and pons. Negative cerebellum. Vascular: Major intracranial vascular flow voids are preserved. Skull and upper cervical spine: Negative  visible cervical spine. Normal bone marrow signal. Sinuses/Orbits: Normal orbits soft tissues. Trace paranasal sinus mucosal thickening. Other: Mastoid air cells are clear. Visible internal auditory structures appear normal. Scalp and face soft tissues appear negative. MRA HEAD FINDINGS Antegrade flow in the posterior circulation with codominant distal vertebral arteries. No distal vertebral stenosis. Normal right PICA origin. The left AICA is dominant. Patent vertebrobasilar junction and basilar artery without stenosis. Normal SCA and PCA origins. Posterior communicating arteries are diminutive or absent. Bilateral PCA branches are within normal limits. Antegrade flow in both ICA siphons. Tortuous distal cervical left ICA just below the skull base. No siphon stenosis. Ophthalmic artery origins are normal. Patent carotid termini. Normal MCA and ACA origins. Anterior communicating artery and bilateral ACA branches are within normal limits. Early left MCA branching, normal variant. The left M1 and left MCA branches appear within normal limits. The right MCA M1 segment, bifurcation, and visible right MCA branches appear normal. IMPRESSION: 1.  No acute intracranial abnormality. 2. Moderate for age nonspecific signal changes primarily in the cerebral white matter, most commonly due to  chronic small vessel disease. 3.  Negative intracranial MRA. Electronically Signed   By: Odessa Fleming M.D.   On: 07/05/2017 08:28    ____________________________________________   PROCEDURES  Procedure(s) performed:   Procedures  None ____________________________________________   INITIAL IMPRESSION / ASSESSMENT AND PLAN / ED COURSE  Pertinent labs & imaging results that were available during my care of the patient were reviewed by me and considered in my medical decision making (see chart for details).  Patient presents to the emergency department for evaluation of lightheadedness and right arm weakness.  The patient's  neurological exam on my evaluation is normal.  He does have tachycardia with normal blood pressures.  No fevers.  Doubt sepsis.  Suspect possible dehydration with some bowel incontinence last night.  Plan for stroke workup/evaluation.  Labs and CT imaging reviewed with no acute findings. No MRI available to me at this time. No indication for emergent transfer with symptoms that have resolved. Plan for admit for TIA evaluation.   Discussed patient's case with Hospitlaist, Dr. Robb Matar to request admission. Patient and family (if present) updated with plan. Care transferred to Hospitalist service.  I reviewed all nursing notes, vitals, pertinent old records, EKGs, labs, imaging (as available).  ____________________________________________  FINAL CLINICAL IMPRESSION(S) / ED DIAGNOSES  Final diagnoses:  TIA (transient ischemic attack)     MEDICATIONS GIVEN DURING THIS VISIT:  Medications  acetaminophen (TYLENOL) tablet 650 mg (650 mg Oral Given 07/05/17 0405)    Or  acetaminophen (TYLENOL) solution 650 mg ( Per Tube See Alternative 07/05/17 0405)    Or  acetaminophen (TYLENOL) suppository 650 mg ( Rectal See Alternative 07/05/17 0405)  enoxaparin (LOVENOX) injection 40 mg (40 mg Subcutaneous Given 07/04/17 2240)  aspirin EC tablet 81 mg (81 mg Oral Given 07/05/17 0833)  atorvastatin (LIPITOR) tablet 10 mg (10 mg Oral Given 07/05/17 0833)  loratadine (CLARITIN) tablet 10 mg (10 mg Oral Given 07/05/17 0833)  cholecalciferol (VITAMIN D) tablet 2,000 Units (2,000 Units Oral Given 07/05/17 0833)  citalopram (CELEXA) tablet 20 mg (20 mg Oral Given 07/04/17 2239)  fluticasone (FLONASE) 50 MCG/ACT nasal spray 2 spray (2 sprays Each Nare Given 07/05/17 0834)  insulin glargine (LANTUS) injection 76 Units (76 Units Subcutaneous Given 07/04/17 2239)  lamoTRIgine (LAMICTAL) tablet 100 mg (100 mg Oral Given 07/05/17 0834)  lisinopril (PRINIVIL,ZESTRIL) tablet 2.5 mg (2.5 mg Oral Given 07/05/17 0833)  pantoprazole  (PROTONIX) EC tablet 40 mg (40 mg Oral Given 07/05/17 0833)  linagliptin (TRADJENTA) tablet 5 mg (5 mg Oral Given 07/05/17 0833)  risperiDONE (RISPERDAL) tablet 0.5 mg (0.5 mg Oral Given 07/04/17 2239)  tamsulosin (FLOMAX) capsule 0.4 mg (0.4 mg Oral Given 07/05/17 0834)  traZODone (DESYREL) tablet 100 mg (100 mg Oral Given 07/04/17 2239)  feeding supplement (ENSURE ENLIVE) (ENSURE ENLIVE) liquid 237 mL (237 mLs Oral Given 07/05/17 0836)  albuterol (PROVENTIL) (2.5 MG/3ML) 0.083% nebulizer solution 2.5 mg (has no administration in time range)  ipratropium-albuterol (DUONEB) 0.5-2.5 (3) MG/3ML nebulizer solution 3 mL (has no administration in time range)  insulin aspart (novoLOG) injection 0-15 Units (0 Units Subcutaneous Not Given 07/05/17 0836)  mupirocin ointment (BACTROBAN) 2 % 1 application (1 application Nasal Given 07/05/17 0835)  Chlorhexidine Gluconate Cloth 2 % PADS 6 each (6 each Topical Given 07/05/17 0836)  metFORMIN (GLUCOPHAGE) tablet 1,000 mg (has no administration in time range)  sodium chloride 0.9 % bolus 1,000 mL (0 mLs Intravenous Stopped 07/04/17 1901)  meclizine (ANTIVERT) tablet 12.5 mg (12.5 mg Oral Given 07/04/17 1943)  stroke: mapping our early stages of recovery book ( Does not apply Given 07/04/17 2239)  sodium chloride 0.9 % bolus 1,000 mL (1,000 mLs Intravenous New Bag/Given 07/05/17 0542)    Note:  This document was prepared using Dragon voice recognition software and may include unintentional dictation errors.  Alona Bene, MD Emergency Medicine    Cythnia Osmun, Arlyss Repress, MD 07/05/17 [phone]

## 2017-07-04 NOTE — H&P (Signed)
History and Physical    Douglas Cook:[id number] DOB: 1945-11-13 DOA: 07/04/2017  PCP: Ignatius Specking, MD   Patient coming from: [facility] nursing home.  I have personally briefly reviewed patient's old medical records in [facility]  Chief Complaint: Dizziness.  HPI: Douglas Cook is a 72 y.o. 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  Douglas Cook denies slurred speech, blurred vision, gait instability or language comprehension difficulty.  Fever, chills, rhinorrhea, sore throat, dyspnea, productive cough, chest pain, palpitations, diaphoresis, PND, orthopnea or pitting edema of the lower extremities.  No abdominal pain, nausea, emesis, diarrhea, constipation, melena or hematochezia.  Denies dysuria, frequency or hematuria.  No polyphagia, polyuria or polydipsia.  Denies heat or cold intolerance.  Denies pruritus or skin rashes.  ED Course: Initial vital signs temperature 37.2C (98.9 F), pulse 106, respirations 24, blood pressure 116/58 mmHg and O2 sat 96%.  Douglas Cook was given a liter of normal saline bolus and 12.5 mg of Antivert p.o. in the ED.  His urinalysis was normal.  Urine rapid drug screen was negative.  White count was 6.6, hemoglobin 9.6 g/dL and platelets 956 with an unremarkable differential.  PT/INR in APTT were within normal limits.  Troponin level was normal.  Electrolytes and LFTs were unremarkable.  Glucose 105, BUN 28 creatinine 1.72 (around baseline from 2 years ago) and alcohol less than 10 mg/dL.  Imaging: CT of the head shows slight H advanced cerebral atrophy with ventriculomegaly and periventricular white matter disease, but does not show any acute intracranial pathology.  A 2 view chest radiograph does not  show any active cardiopulmonary disease.  Please see images and full radiology report for further detail.  Review of Systems: As per HPI otherwise 10 point review of systems negative.    Past Medical History:  Diagnosis Date  . 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 Arthritis   . CKD (chronic kidney disease), stage III (HCC)   . COPD (chronic obstructive pulmonary disease) (HCC)   . Depression   . Diabetes mellitus without complication (HCC)   . Fatigue   . Hyperlipidemia   . Reflux     History reviewed. No pertinent surgical history.   reports that Douglas Cook has been smoking pipe.  Douglas Cook has never used smokeless tobacco. Douglas Cook reports that Douglas Cook does not drink alcohol or use drugs.  No Known Allergies  Family History  Problem Relation Age of Onset  . Stroke Mother   . Diabetes Father    Prior to Admission medications   Medication Sig Start Date End Date Taking? Authorizing Provider  aspirin EC 81 MG tablet Take 81 mg by mouth daily.   Yes [provider]  atorvastatin (LIPITOR) 10 MG tablet Take 10 mg by mouth daily.   Yes [provider]  cetirizine (ZYRTEC) 10 MG tablet Take 10 mg by mouth at bedtime.   Yes [provider]  cholecalciferol (VITAMIN D) 1000 UNITS tablet Take 2,000 Units by mouth daily.   Yes [provider]  citalopram (CELEXA) 20 MG tablet Take 20 mg by mouth at bedtime.   Yes [provider]  COMBIVENT RESPIMAT 20-100 MCG/ACT AERS respimat Inhale 2 puffs into the lungs 2 (two) times daily. 02/11/13  Yes  [provider]  fluticasone (FLONASE) 50 MCG/ACT nasal spray Place 2 sprays into both nostrils daily. 03/02/13  Yes [provider]  Insulin Glargine (TOUJEO MAX SOLOSTAR) 300 UNIT/ML SOPN Inject 76 Units into the skin at bedtime.   Yes [provider]  insulin lispro (HUMALOG) 100 UNIT/ML injection Inject 15 Units into the skin 3 (three) times daily with meals.    Yes [provider]  lamoTRIgine (LAMICTAL) 100 MG tablet Take 1 tablet by mouth daily. 04/08/13  Yes [provider]  lisinopril (PRINIVIL,ZESTRIL) 2.5 MG tablet Take 2.5 mg by mouth daily.   Yes [provider]  metFORMIN (GLUCOPHAGE) 1000 MG tablet Take 1,000 mg by mouth 2 (two) times daily with a meal.   Yes [provider]  omeprazole (PRILOSEC) 20 MG capsule Take 1 capsule by mouth daily. 04/08/13  Yes [provider]  ONGLYZA 5 MG TABS tablet Take 1 tablet by mouth daily. 04/08/13  Yes [provider]  Propylene Glycol (SYSTANE BALANCE OP) Apply 1 drop to eye 3 (three) times daily.   Yes [provider]  risperiDONE (RISPERDAL) 0.5 MG tablet Take 0.5 mg by mouth at bedtime.   Yes [provider]  tamsulosin (FLOMAX) 0.4 MG CAPS capsule Take 1 capsule by mouth daily. 04/08/13  Yes [provider]  traZODone (DESYREL) 100 MG tablet Take 1 tablet by mouth at bedtime. 04/08/13  Yes [provider]    Physical Exam: Vitals:   07/04/17 1930 07/04/17 1944 07/04/17 2049 07/04/17 2050  BP: 123/66 123/66 126/63   Pulse: 92 (!) 106 91   Resp:  (!) 24    Temp:   99.6 F (37.6 C)   TempSrc:   Oral   SpO2: 97% 94% 97%   Weight:    101.9 kg (224 lb 10.4 oz)  Height:    6' (1.829 m)    Constitutional: NAD, calm, comfortable Eyes: PERRL, lids and conjunctivae normal ENMT: Mucous membranes are moist. Posterior pharynx clear of any exudate or lesions. Neck: normal, supple, no masses, no thyromegaly Respiratory: clear to auscultation bilaterally, no wheezing, no crackles. Normal respiratory effort. No accessory muscle use.  Cardiovascular: Regular rate and rhythm, no murmurs / rubs / gallops. No extremity edema. 2+ pedal pulses. No carotid bruits.  Abdomen: Obese, soft, no tenderness, no masses palpated. No hepatosplenomegaly. Bowel sounds positive.  Musculoskeletal: no clubbing / cyanosis. Good ROM, no contractures. Normal  muscle tone.  Skin: Areas of ecchymosis on upper extremities from venipuncture. Neurologic: CN 2-12 grossly intact. Sensation is unchanged from previous, DTR normal. Strength 4/5 right-sided hemiparesis, which seems to be at baseline per patient.  Decreased handgrip strength on the right.  No pronator drift on the left upper extremity. Psychiatric: Normal judgment and insight. Alert and oriented x 3. Normal mood.    Labs on Admission: I have personally reviewed following labs and imaging studies  CBC: Recent Labs  Lab 07/04/17 1725  WBC 6.6  NEUTROABS 5.1  HGB 9.6*  HCT 30.4*  MCV 84.2  PLT 247   Basic Metabolic Panel: Recent Labs  Lab 07/04/17 1725  NA 138  K 4.7  CL 103  CO2 25  GLUCOSE 105*  BUN 28*  CREATININE 1.72*  CALCIUM 9.3   GFR: Estimated Creatinine Clearance: 50.1 mL/min (A) (by C-G formula based on SCr of 1.72 mg/dL (H)). Liver Function Tests: Recent Labs  Lab 07/04/17 1725  AST 23  ALT 24  ALKPHOS 30*  BILITOT 0.3  PROT 6.9  ALBUMIN 3.7   No results for input(s): LIPASE, AMYLASE in the last 168 hours. No results for input(s): AMMONIA in the last 168 hours. Coagulation Profile: Recent Labs  Lab 07/04/17 1725  INR 0.94   Cardiac Enzymes: No results for input(s): CKTOTAL, CKMB, CKMBINDEX, TROPONINI in the last 168 hours. BNP (last 3 results) No results for input(s): PROBNP in the last 8760 hours. HbA1C: No results for input(s): HGBA1C in the last 72 hours. CBG: No results for input(s): GLUCAP in the last 168 hours. Lipid Profile: No results for input(s): CHOL, HDL, LDLCALC, TRIG, CHOLHDL, LDLDIRECT in the last 72 hours. Thyroid Function Tests: No results for input(s): TSH, T4TOTAL, FREET4, T3FREE, THYROIDAB in the last 72 hours. Anemia Panel: No results for input(s): VITAMINB12, FOLATE, FERRITIN, TIBC, IRON, RETICCTPCT in the last 72 hours. Urine analysis:    Component Value Date/Time   COLORURINE YELLOW 07/04/2017 1709   APPEARANCEUR  CLEAR 07/04/2017 1709   LABSPEC 1.015 07/04/2017 1709   PHURINE 5.0 07/04/2017 1709   GLUCOSEU NEGATIVE 07/04/2017 1709   HGBUR NEGATIVE 07/04/2017 1709   BILIRUBINUR NEGATIVE 07/04/2017 1709   KETONESUR NEGATIVE 07/04/2017 1709   PROTEINUR NEGATIVE 07/04/2017 1709   NITRITE NEGATIVE 07/04/2017 1709   LEUKOCYTESUR NEGATIVE 07/04/2017 1709    Radiological Exams on Admission: Dg Chest 2 View  Result Date: 07/04/2017 CLINICAL DATA:  72 y/o M; dizziness at started this morning. Right arm weakness. EXAM: CHEST - 2 VIEW COMPARISON:  11/21/2014 chest radiograph FINDINGS: Stable heart size and mediastinal contours are within normal limits. Both lungs are clear. The visualized skeletal structures are unremarkable. IMPRESSION: No active cardiopulmonary disease. Electronically Signed   By: Mitzi Hansen M.D.   On: 07/04/2017 18:17   Ct Head Wo Contrast  Result Date: 07/04/2017 CLINICAL DATA:  Dizziness and right arm weakness. EXAM: CT HEAD WITHOUT CONTRAST TECHNIQUE: Contiguous axial images were obtained from the base of the skull through the vertex without intravenous contrast. COMPARISON:  None. FINDINGS: Brain: Slight age advanced cerebral atrophy, ventriculomegaly and periventricular white matter disease. No CT findings for acute hemispheric infarction or intracranial hemorrhage. No mass lesions are identified. No extra-axial fluid collections. Small basal ganglia calcifications are on the left. The brainstem and cerebellum are grossly normal. Vascular: No hyperdense vessel or unexpected calcification. Skull: No skull fracture or bone lesions. Sinuses/Orbits: The paranasal sinuses and mastoid air cells are clear. Other: No scalp lesions or hematoma. IMPRESSION: Slight age advanced cerebral atrophy, ventriculomegaly and periventricular white matter disease. No acute intracranial findings or mass lesions. Electronically Signed   By: Rudie Meyer M.D.   On: 07/04/2017 18:54    EKG:  Independently reviewed.  Vent. rate 100 BPM PR interval * ms QRS duration 81 ms QT/QTc 337/435 ms P-R-T axes 74 85 59 Sinus tachycardia Borderline right axis deviation Baseline wander in lead(s) V6  Assessment/Plan Principal Problem:   TIA (transient ischemic attack) Telemetry/observation. Frequent neuro checks. Consult PT/OT. The patient passed swallow screen. Check hemoglobin A1c. Check fasting lipid panel. Check carotid Doppler and echocardiogram. Check MRI/MRA to brain in the morning.  Active Problems:   CKD (chronic kidney disease), stage III (HCC) Renal function around baseline. Monitor BUN, creatinine and electrolytes Continue lisinopril 2.5 mg p.o. daily in the a.m.    COPD (chronic obstructive pulmonary disease) (HCC) No signs of decompensation at this time. Continue supplemental oxygen. Continue duo nebs twice a day. Continue albuterol 2.5 mg nebs every 6 hours as needed.    Type 2  diabetes mellitus (HCC)   GERD (gastroesophageal reflux disease)  Protonix 40 mg p.o. daily.    Hyperlipidemia Continue atorvastatin 10 mg p.o. daily. Monitor LFTs and fasting lipid panel as needed.    Depression Continue Celexa 20 mg p.o. at bedtime. Continue Risperdal 0.5 mg p.o. at bedtime.   DVT prophylaxis: Lovenox SQ. Code Status: Full code. Family Communication:  Disposition Plan: Observation for 24-48 hours for TIA workup and neurological monitoring. Consults called:  Admission status: Observation/telemetry.   Bobette Moavid Manuel Priyana Mccarey MD [facility] Pager [phone].  If 7PM-7AM, please contact night-coverage [email or web address] Password [email or web address]  07/04/2017, 9:31 PM

## 2017-07-05 ENCOUNTER — Observation Stay (HOSPITAL_COMMUNITY): Payer: Medicare Other

## 2017-07-05 ENCOUNTER — Other Ambulatory Visit (HOSPITAL_COMMUNITY): Payer: Medicare Other

## 2017-07-05 ENCOUNTER — Encounter (HOSPITAL_COMMUNITY): Payer: Self-pay | Admitting: Internal Medicine

## 2017-07-05 ENCOUNTER — Observation Stay (HOSPITAL_BASED_OUTPATIENT_CLINIC_OR_DEPARTMENT_OTHER): Payer: Medicare Other

## 2017-07-05 DIAGNOSIS — E1122 Type 2 diabetes mellitus with diabetic chronic kidney disease: Secondary | ICD-10-CM

## 2017-07-05 DIAGNOSIS — R29898 Other symptoms and signs involving the musculoskeletal system: Secondary | ICD-10-CM | POA: Diagnosis not present

## 2017-07-05 DIAGNOSIS — G459 Transient cerebral ischemic attack, unspecified: Secondary | ICD-10-CM | POA: Diagnosis not present

## 2017-07-05 DIAGNOSIS — I6523 Occlusion and stenosis of bilateral carotid arteries: Secondary | ICD-10-CM | POA: Diagnosis not present

## 2017-07-05 DIAGNOSIS — Z794 Long term (current) use of insulin: Secondary | ICD-10-CM

## 2017-07-05 DIAGNOSIS — R42 Dizziness and giddiness: Secondary | ICD-10-CM | POA: Diagnosis not present

## 2017-07-05 DIAGNOSIS — N183 Chronic kidney disease, stage 3 (moderate): Secondary | ICD-10-CM | POA: Diagnosis not present

## 2017-07-05 DIAGNOSIS — N179 Acute kidney failure, unspecified: Secondary | ICD-10-CM | POA: Diagnosis not present

## 2017-07-05 LAB — GLUCOSE, CAPILLARY
Glucose-Capillary: 56 mg/dL — ABNORMAL LOW (ref 65–99)
Glucose-Capillary: 92 mg/dL (ref 65–99)
Glucose-Capillary: 154 mg/dL — ABNORMAL HIGH (ref 65–99)

## 2017-07-05 LAB — URINALYSIS, ROUTINE W REFLEX MICROSCOPIC
BILIRUBIN URINE: NEGATIVE
Glucose, UA: NEGATIVE mg/dL
HGB URINE DIPSTICK: NEGATIVE
Ketones, ur: NEGATIVE mg/dL
Leukocytes, UA: NEGATIVE
Nitrite: NEGATIVE
PH: 5 (ref 5.0–8.0)
Protein, ur: NEGATIVE mg/dL
SPECIFIC GRAVITY, URINE: 1.009 (ref 1.005–1.030)

## 2017-07-05 LAB — COMPREHENSIVE METABOLIC PANEL
ALBUMIN: 3.1 g/dL — AB (ref 3.5–5.0)
ALK PHOS: 23 U/L — AB (ref 38–126)
ALT: 23 U/L (ref 17–63)
AST: 23 U/L (ref 15–41)
Anion gap: 9 (ref 5–15)
BILIRUBIN TOTAL: 0.4 mg/dL (ref 0.3–1.2)
BUN: 21 mg/dL — AB (ref 6–20)
CALCIUM: 8.5 mg/dL — AB (ref 8.9–10.3)
CO2: 25 mmol/L (ref 22–32)
Chloride: 103 mmol/L (ref 101–111)
Creatinine, Ser: 1.53 mg/dL — ABNORMAL HIGH (ref 0.61–1.24)
GFR calc Af Amer: 52 mL/min — ABNORMAL LOW (ref >60)
GFR calc non Af Amer: 45 mL/min — ABNORMAL LOW (ref >60)
GLUCOSE: 68 mg/dL (ref 65–99)
Potassium: 4.2 mmol/L (ref 3.5–5.1)
Sodium: 137 mmol/L (ref 135–145)
TOTAL PROTEIN: 6.1 g/dL — AB (ref 6.5–8.1)

## 2017-07-05 LAB — LIPID PANEL
CHOL/HDL RATIO: 2.9 ratio
Cholesterol: 87 mg/dL (ref 0–200)
HDL: 30 mg/dL — ABNORMAL LOW (ref >40)
LDL CALC: 44 mg/dL (ref 0–99)
Triglycerides: 67 mg/dL (ref <150)
VLDL: 13 mg/dL (ref 0–40)

## 2017-07-05 LAB — ECHOCARDIOGRAM COMPLETE
HEIGHTINCHES: 72 in
WEIGHTICAEL: 3594.38 [oz_av]

## 2017-07-05 LAB — LACTIC ACID, PLASMA
Lactic Acid, Venous: 0.7 mmol/L (ref 0.5–1.9)
Lactic Acid, Venous: 1 mmol/L (ref 0.5–1.9)

## 2017-07-05 LAB — CBC WITH DIFFERENTIAL/PLATELET
BASOS ABS: 0 10*3/uL (ref 0.0–0.1)
BASOS PCT: 0 %
Eosinophils Absolute: 0.2 10*3/uL (ref 0.0–0.7)
Eosinophils Relative: 4 %
HCT: 28.8 % — ABNORMAL LOW (ref 39.0–52.0)
HEMOGLOBIN: 8.9 g/dL — AB (ref 13.0–17.0)
Lymphocytes Relative: 17 %
Lymphs Abs: 0.9 10*3/uL (ref 0.7–4.0)
MCH: 26 pg (ref 26.0–34.0)
MCHC: 30.9 g/dL (ref 30.0–36.0)
MCV: 84.2 fL (ref 78.0–100.0)
Monocytes Absolute: 0.7 10*3/uL (ref 0.1–1.0)
Monocytes Relative: 13 %
NEUTROS PCT: 66 %
Neutro Abs: 3.3 10*3/uL (ref 1.7–7.7)
Platelets: 227 10*3/uL (ref 150–400)
RBC: 3.42 MIL/uL — AB (ref 4.22–5.81)
RDW: 16.5 % — ABNORMAL HIGH (ref 11.5–15.5)
WBC: 5 10*3/uL (ref 4.0–10.5)

## 2017-07-05 LAB — HEMOGLOBIN A1C
HEMOGLOBIN A1C: 6.8 % — AB (ref 4.8–5.6)
MEAN PLASMA GLUCOSE: 148.46 mg/dL

## 2017-07-05 LAB — MRSA PCR SCREENING: MRSA by PCR: POSITIVE — AB

## 2017-07-05 MED ORDER — MUPIROCIN 2 % EX OINT
1.0000 "application " | TOPICAL_OINTMENT | Freq: Two times a day (BID) | CUTANEOUS | Status: DC
  Administered 2017-07-05: 1 via NASAL
  Filled 2017-07-05: qty 22

## 2017-07-05 MED ORDER — SODIUM CHLORIDE 0.9 % IV BOLUS
1000.0000 mL | Freq: Once | INTRAVENOUS | Status: AC
  Administered 2017-07-05: 1000 mL via INTRAVENOUS

## 2017-07-05 MED ORDER — CHLORHEXIDINE GLUCONATE CLOTH 2 % EX PADS
6.0000 | MEDICATED_PAD | Freq: Every day | CUTANEOUS | Status: DC
  Administered 2017-07-05: 6 via TOPICAL

## 2017-07-05 MED ORDER — METFORMIN HCL 500 MG PO TABS: 1000.0000 mg | ORAL_TABLET | Freq: Two times a day (BID) | ORAL | Status: DC

## 2017-07-05 NOTE — Clinical Social Work Note (Signed)
Discharge clinicals sent. Facility notified and indicate that they will pick patient up.    LCSW signing off.     Zaila Crew, Juleen ChinaHeather D, LCSW

## 2017-07-05 NOTE — Progress Notes (Signed)
Patient is to be discharged to [facility] and in stable condition. Patient made aware of discharge and agreeable. Report given to [facility] staff. Patient awaiting transportation at this time.  Quita SkyeMorgan P Dishmon, RN

## 2017-07-05 NOTE — Progress Notes (Signed)
Inpatient Diabetes Program Recommendations  AACE/ADA: New Consensus Statement on Inpatient Glycemic Control (2015)  Target Ranges:  Prepandial:   less than 140 mg/dL      Peak postprandial:   less than 180 mg/dL (1-2 hours)      Critically ill patients:  140 - 180 mg/dL   Lab Results  Component Value Date   GLUCAP 154 (H) 07/05/2017    Review of Glycemic Control  Diabetes history: Type 2 Outpatient Diabetes medications: Toujeo 76 units every HS, Humalog 15 units TID, Metformin, Onglyza Current orders for Inpatient glycemic control: Lantus 76 units every HS, Novolog MODERATE TID, Tradjenta, Metformin  Inpatient Diabetes Program Recommendations:   Noted that patient's blood sugar dropped to 56 mg/dl  At [id number].  If blood sugars continue to be less than 100 mg/dl, recommend decreasing Lantus to 60 units every HS (20% of home dose) and continuing  Novolog MODERATE correction scale TID. Will continue to monitor blood sugars while in the hospital.   Smith MinceKendra Michille Mcelrath RN BSN CDE Diabetes Coordinator Pager: [phone]  8am-5pm

## 2017-07-05 NOTE — Clinical Social Work Note (Signed)
Clinical Social Work Assessment  Patient Details  Name: Douglas Cook MRN: [id number] Date of Birth: 04-07-1945  Date of referral:  07/05/17               Reason for consult:  Facility Placement                Permission sought to share information with:    Permission granted to share information::     Name::        Agency::  [facility]  Relationship::     Contact Information:     Housing/Transportation Living arrangements for the past 2 months:  Assisted Living Facility Source of Information:  Facility Patient Interpreter Needed:  None Criminal Activity/Legal Involvement Pertinent to Current Situation/Hospitalization:  No - Comment as needed Significant Relationships:  Siblings Lives with:  Facility Resident Do you feel safe going back to the place where you live?  Yes Need for family participation in patient care:  Yes (Comment)  Care giving concerns:  Facility resident. No concerns.   Social Worker assessment / plan:  Patient has been a residnet at [facility] since 12/18/12. He ambulates independently. Staff assists patient with bathing and some dressing. He is continent, however staff assists him with cleaning himself up.  Patient's sister is supportive.  He can return to the facility at discharge.   Employment status:  Retired Health and safety inspectornsurance information:  Medicare PT Recommendations:  No Follow Up Information / Referral to community resources:     Patient/Family's Response to care: Patient is a long term resident at the facility.   Patient/Family's Understanding of and Emotional Response to Diagnosis, Current Treatment, and Prognosis:  Patient understands his diagnosis, treatment and prognosis.   Emotional Assessment Appearance:  Appears stated age Attitude/Demeanor/Rapport:    Affect (typically observed):  Accepting, Calm Orientation:  Oriented to Self, Oriented to Place, Oriented to  Time, Oriented to Situation Alcohol / Substance use:  Not Applicable Psych  involvement (Current and /or in the community):  No (Comment)  Discharge Needs  Concerns to be addressed:  Other (Comment Required(return to [facility]) Readmission within the last 30 days:  No Current discharge risk:  None Barriers to Discharge:  No Barriers Identified   Annice NeedySettle, Patryk Conant D, LCSW 07/05/2017, 2:37 PM

## 2017-07-05 NOTE — Evaluation (Signed)
Speech Language Pathology Evaluation Patient Details Name: Douglas Cook MRN: [id number] DOB: 02/15/1946 Today's Date: 07/05/2017 Time: [id number] SLP Time Calculation (min) (ACUTE ONLY): 31 min  Problem List:  Patient Active Problem List   Diagnosis Date Noted  . AKI (acute kidney injury) (HCC) 07/04/2017  . CKD (chronic kidney disease), stage III (HCC) 07/04/2017  . COPD (chronic obstructive pulmonary disease) (HCC) 07/04/2017  . Type 2 diabetes mellitus (HCC) 07/04/2017  . GERD (gastroesophageal reflux disease) 07/04/2017  . Hyperlipidemia 07/04/2017  . Depression 07/04/2017   Past Medical History:  Past Medical History:  Diagnosis Date  . 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 Arthritis   . CKD (chronic kidney disease), stage III (HCC)   . COPD (chronic obstructive pulmonary disease) (HCC)   . Depression   . Diabetes mellitus without complication (HCC)   . Fatigue   . Hyperlipidemia   . Reflux    Past Surgical History: History reviewed. No pertinent surgical history. HPI:  Douglas Cook is a 72 y.o. 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  He denies slurred speech, blurred vision, gait instability or language comprehension difficulty.  Fever, chills, rhinorrhea, sore throat, dyspnea, productive cough, chest pain, palpitations, diaphoresis, PND, orthopnea or pitting edema of the lower extremities.  No abdominal pain, nausea, emesis, diarrhea, constipation, melena or hematochezia.  Denies dysuria, frequency or hematuria.  No polyphagia, polyuria or polydipsia.  Denies heat or cold intolerance.  Denies pruritus or skin rashes. Patient found by staff and he was too weak to get up and even had a bowel  movement on the floor, but he was conscious for this.  He said that he was just too weak to make it to the commode.  In the emergency room, there is a question mark of some right upper extremity weakness, although patient expressed that he has intermittent right upper extremity weakness that is been going on for many years.  It often happens when he is more tired.  Weakness that was noted in the emergency room, was same as before.  Patient states that that part is not new.  CT scan of head was unremarkable.  Lab work is noteworthy for mild acute kidney injury in the setting of stage III chronic kidney disease.  Patient started on IV fluids and came in for further evaluation.  Treated as stroke workup given arm weakness and dizziness.  Lab pressure is not elevated.  MRI Head Findings:  Negative    Assessment / Plan / Recommendation Clinical Impression  Pt was alert and cooperative during assessment and presents with cognitive linguistic deficits characterized by significant deficits in working memory, planning/executive function skills, verbal fluency, sustained attention, as well as word finding deficits in conversation.  The deficits noted above appear to have a chronic onset with patient commenting that his memory and ability to focus have decreased in the past couple of months.  Based on [facility] 13[facility]30 [facility]), follow up with a speech-language pathologist is recommended upon re-entry to [facility].    SLP Assessment  SLP Recommendation/Assessment: All further Speech Lanaguage Pathology  needs can be addressed in the next venue of care SLP Visit Diagnosis:  Cognitive communication deficit (R41.841)    Follow Up Recommendations  Skilled Nursing facility    Frequency and Duration  N/A         SLP Evaluation Cognition  Overall Cognitive Status: No family/caregiver present to determine baseline cognitive  functioning Arousal/Alertness: Awake/alert Orientation Level: Oriented X4 Attention: (Impaired (2/6 correct)) Memory: Impaired((0/5 recalled after 5 minutes)) Memory Impairment: Decreased recall of new information(Effective techniques:  category and multiple choice cues) Awareness: Appears intact Comments: Pt demonstrated difficulty staying on track during evaluation and said, " I can't remember what I was doing".       Comprehension  Auditory Comprehension Overall Auditory Comprehension: Appears within functional limits for tasks assessed(Understood tasks, but forgot what he was doing during tasks.) Yes/No Questions: Within Functional Limits Commands: Within Functional Limits Conversation: Complex Visual Recognition/Discrimination Discrimination: Not tested Reading Comprehension Reading Status: Not tested    Expression Expression Primary Mode of Expression: Verbal Verbal Expression Overall Verbal Expression: Appears within functional limits for tasks assessed Initiation: No impairment Automatic Speech: Name;Social Response;Day of week;Month of year Level of Generative/Spontaneous Verbalization: Conversation Repetition: Impaired Level of Impairment: Sentence level Naming: Impairment Confrontation: Impaired Convergent: 75-100% accurate Pragmatics: No impairment Written Expression Dominant Hand: Left Written Expression: Not tested   Oral / Motor  Oral Motor/Sensory Function Overall Oral Motor/Sensory Function: Within functional limits Motor Speech Overall Motor Speech: Appears within functional limits for tasks assessed Respiration: Within functional limits Phonation: Normal Resonance: Within functional limits Articulation: Within functional limitis Intelligibility: Intelligible Motor Planning: Witnin functional limits Motor Speech Errors: Not applicable   GO                    Antonietta Jewel, M.A., CF-SLP 07/05/2017, 4:52 PM

## 2017-07-05 NOTE — Discharge Summary (Signed)
Discharge Summary  Douglas Cook [name]:[id number] DOB: 07-02-1945  PCP: Ignatius Specking, MD  Admit date: 07/04/2017 Discharge date: 07/05/2017  Time spent: 25 minutes  Recommendations for Outpatient Follow-up:  1. Return back to assisted living facility, no changes in medications  Discharge Diagnoses:  Active Hospital Problems   Diagnosis Date Noted  . AKI (acute kidney injury) (HCC) 07/04/2017  . CKD (chronic kidney disease), stage III (HCC) 07/04/2017  . COPD (chronic obstructive pulmonary disease) (HCC) 07/04/2017  . Type 2 diabetes mellitus (HCC) 07/04/2017  . GERD (gastroesophageal reflux disease) 07/04/2017  . Hyperlipidemia 07/04/2017  . Depression 07/04/2017    Resolved Hospital Problems  No resolved problems to display.    Discharge Condition: Improved, being discharged back to assisted living  Diet recommendation: Car modified, low sodium  Vitals:   07/05/17 0934 07/05/17 1346  BP: 114/63 116/61  Pulse: 80 85  Resp: 19 17  Temp: 98.7 F (37.1 C) 98.9 F (37.2 C)  SpO2: 100% 97%    History of present illness:  72 year old male with past medical history of anemia from stage III chronic kidney disease, COPD and diabetes brought in from his assisted living facility with dizziness.  Patient found by staff and he was too weak to get up and even had a bowel movement on the floor, but he was conscious for this.  He said that he was just too weak to make it to the commode.  In the emergency room, there is a question mark of some right upper extremity weakness, although patient expressed that he has intermittent right upper extremity weakness that is been going on for many years.  It often happens when he is more tired.  Weakness that was noted in the emergency room, was same as before.  Patient states that that part is not new.  CT scan of head was unremarkable.  Lab work is noteworthy for mild acute kidney injury in the setting of stage III chronic kidney disease.  Patient  started on IV fluids and came in for further evaluation.  Treated as stroke workup given arm weakness and dizziness.  Lab pressure is not elevated.  Hospital Course:  Principal Problem:   AKI (acute kidney injury) (HCC) in the setting of stage III chronic kidney disease: With IV fluids, creatinine back at baseline.  Creatinine now 1.53 with GFR 45.  Patient not dizzy anymore.  Feels back to normal.  Given resolution of dizziness and treatment IV fluids, felt to be safe for discharge back to assisted living Active Problems:  Right upper extremity weakness.  Underwent stroke workup noting negative carotid Dopplers, no clot observed and echocardiogram and MRI negative.  As mentioned before, patient has a history of chronic right upper extremity weakness which is episodic.  Unclear etiology however it is been ongoing and not acute.   COPD (chronic obstructive pulmonary disease) (HCC): Currently stable, on room air   Type 2 diabetes mellitus (HCC) uncontrolled with chronic renal disease: Had some episodes of hypoglycemia due to food restrictions and continue to get his normal dose of Lantus.  Resume normal diet and home medications on discharge   GERD (gastroesophageal reflux disease): Continue PPI   Hyperlipidemia   Depression   Procedures:  Carotid Dopplers done 4/12: No signs of significant stenoses bilaterally  Consultations:  None   Discharge Exam: BP 116/61 (BP Location: Left Arm)   Pulse 85   Temp 98.9 F (37.2 C)   Resp 17   Ht 6' (1.829 m)  Wt 101.9 kg (224 lb 10.4 oz)   SpO2 97%   BMI 30.47 kg/m   General: Alert and oriented x2, no acute distress Cardiovascular: Regular rate and rhythm, S1-S2 Respiratory: Clear to auscultation bilaterally  Discharge Instructions You were cared for by a hospitalist during your hospital stay. If you have any questions about your discharge medications or the care you received while you were in the hospital after you are discharged, you can  call the unit and asked to speak with the hospitalist on call if the hospitalist that took care of you is not available. Once you are discharged, your primary care physician will handle any further medical issues. Please note that NO REFILLS for any discharge medications will be authorized once you are discharged, as it is imperative that you return to your primary care physician (or establish a relationship with a primary care physician if you do not have one) for your aftercare needs so that they can reassess your need for medications and monitor your lab values.  Discharge Instructions    Diet - low sodium heart healthy   Complete by:  As directed    Increase activity slowly   Complete by:  As directed      Allergies as of 07/05/2017   No Known Allergies     Medication List    TAKE these medications   aspirin EC 81 MG tablet Take 81 mg by mouth daily.   atorvastatin 10 MG tablet Commonly known as:  LIPITOR Take 10 mg by mouth daily.   cetirizine 10 MG tablet Commonly known as:  ZYRTEC Take 10 mg by mouth at bedtime.   cholecalciferol 1000 units tablet Commonly known as:  VITAMIN D Take 2,000 Units by mouth daily.   citalopram 20 MG tablet Commonly known as:  CELEXA Take 20 mg by mouth at bedtime.   COMBIVENT RESPIMAT 20-100 MCG/ACT Aers respimat Generic drug:  Ipratropium-Albuterol Inhale 2 puffs into the lungs 2 (two) times daily.   fluticasone 50 MCG/ACT nasal spray Commonly known as:  FLONASE Place 2 sprays into both nostrils daily.   insulin lispro 100 UNIT/ML injection Commonly known as:  HUMALOG Inject 15 Units into the skin 3 (three) times daily with meals.   lamoTRIgine 100 MG tablet Commonly known as:  LAMICTAL Take 1 tablet by mouth daily.   lisinopril 2.5 MG tablet Commonly known as:  PRINIVIL,ZESTRIL Take 2.5 mg by mouth daily.   metFORMIN 1000 MG tablet Commonly known as:  GLUCOPHAGE Take 1,000 mg by mouth 2 (two) times daily with a meal.     omeprazole 20 MG capsule Commonly known as:  PRILOSEC Take 1 capsule by mouth daily.   ONGLYZA 5 MG Tabs tablet Generic drug:  saxagliptin HCl Take 1 tablet by mouth daily.   risperiDONE 0.5 MG tablet Commonly known as:  RISPERDAL Take 0.5 mg by mouth at bedtime.   SYSTANE BALANCE OP Apply 1 drop to eye 3 (three) times daily.   tamsulosin 0.4 MG Caps capsule Commonly known as:  FLOMAX Take 1 capsule by mouth daily.   TOUJEO MAX SOLOSTAR 300 UNIT/ML Sopn Generic drug:  Insulin Glargine Inject 76 Units into the skin at bedtime.   traZODone 100 MG tablet Commonly known as:  DESYREL Take 1 tablet by mouth at bedtime.      No Known Allergies    The results of significant diagnostics from this hospitalization (including imaging, microbiology, ancillary and laboratory) are listed below for reference.    Significant  Diagnostic Studies: Dg Chest 2 View  Result Date: 07/04/2017 CLINICAL DATA:  72 y/o M; dizziness at started this morning. Right arm weakness. EXAM: CHEST - 2 VIEW COMPARISON:  11/21/2014 chest radiograph FINDINGS: Stable heart size and mediastinal contours are within normal limits. Both lungs are clear. The visualized skeletal structures are unremarkable. IMPRESSION: No active cardiopulmonary disease. Electronically Signed   By: Mitzi Hansen M.D.   On: 07/04/2017 18:17   Ct Head Wo Contrast  Result Date: 07/04/2017 CLINICAL DATA:  Dizziness and right arm weakness. EXAM: CT HEAD WITHOUT CONTRAST TECHNIQUE: Contiguous axial images were obtained from the base of the skull through the vertex without intravenous contrast. COMPARISON:  None. FINDINGS: Brain: Slight age advanced cerebral atrophy, ventriculomegaly and periventricular white matter disease. No CT findings for acute hemispheric infarction or intracranial hemorrhage. No mass lesions are identified. No extra-axial fluid collections. Small basal ganglia calcifications are on the left. The brainstem and  cerebellum are grossly normal. Vascular: No hyperdense vessel or unexpected calcification. Skull: No skull fracture or bone lesions. Sinuses/Orbits: The paranasal sinuses and mastoid air cells are clear. Other: No scalp lesions or hematoma. IMPRESSION: Slight age advanced cerebral atrophy, ventriculomegaly and periventricular white matter disease. No acute intracranial findings or mass lesions. Electronically Signed   By: Rudie Meyer M.D.   On: 07/04/2017 18:54   Mr Brain Wo Contrast  Result Date: 07/05/2017 CLINICAL DATA:  72 year old male with dizziness and right arm weakness. EXAM: MRI HEAD WITHOUT CONTRAST MRA HEAD WITHOUT CONTRAST TECHNIQUE: Multiplanar, multiecho pulse sequences of the brain and surrounding structures were obtained without intravenous contrast. Angiographic images of the head were obtained using MRA technique without contrast. COMPARISON:  Head CT without contrast 07/04/2017. FINDINGS: MRI HEAD FINDINGS Brain: No restricted diffusion to suggest acute infarction. No midline shift, mass effect, evidence of mass lesion, ventriculomegaly, extra-axial collection or acute intracranial hemorrhage. Cervicomedullary junction and pituitary are within normal limits. Confluent periventricular and other scattered and patchy bilateral cerebral white matter T2 and FLAIR hyperintensity is moderate for age but in a nonspecific configuration. No cortical encephalomalacia or chronic cerebral blood products are identified. Mild for age signal heterogeneity in the deep gray matter nuclei and pons. Negative cerebellum. Vascular: Major intracranial vascular flow voids are preserved. Skull and upper cervical spine: Negative visible cervical spine. Normal bone marrow signal. Sinuses/Orbits: Normal orbits soft tissues. Trace paranasal sinus mucosal thickening. Other: Mastoid air cells are clear. Visible internal auditory structures appear normal. Scalp and face soft tissues appear negative. MRA HEAD FINDINGS  Antegrade flow in the posterior circulation with codominant distal vertebral arteries. No distal vertebral stenosis. Normal right PICA origin. The left AICA is dominant. Patent vertebrobasilar junction and basilar artery without stenosis. Normal SCA and PCA origins. Posterior communicating arteries are diminutive or absent. Bilateral PCA branches are within normal limits. Antegrade flow in both ICA siphons. Tortuous distal cervical left ICA just below the skull base. No siphon stenosis. Ophthalmic artery origins are normal. Patent carotid termini. Normal MCA and ACA origins. Anterior communicating artery and bilateral ACA branches are within normal limits. Early left MCA branching, normal variant. The left M1 and left MCA branches appear within normal limits. The right MCA M1 segment, bifurcation, and visible right MCA branches appear normal. IMPRESSION: 1.  No acute intracranial abnormality. 2. Moderate for age nonspecific signal changes primarily in the cerebral white matter, most commonly due to chronic small vessel disease. 3.  Negative intracranial MRA. Electronically Signed   By: Althea Grimmer.D.  On: 07/05/2017 08:28   [place] Carotid Bilateral (at [facility] And [facility] Only)  Result Date: 07/05/2017 CLINICAL DATA:  TIA. Dizziness. Right arm weakness for the past day. EXAM: BILATERAL CAROTID DUPLEX ULTRASOUND TECHNIQUE: Wallace CullensGray scale imaging, color Doppler and duplex ultrasound were performed of bilateral carotid and vertebral arteries in the neck. COMPARISON:  None. FINDINGS: Criteria: Quantification of carotid stenosis is based on velocity parameters that correlate the residual internal carotid diameter with NASCET-based stenosis levels, using the diameter of the distal internal carotid lumen as the denominator for stenosis measurement. The following velocity measurements were obtained: RIGHT ICA:  118/41 cm/sec CCA:  87/15 cm/sec SYSTOLIC ICA/CCA RATIO:  1.4 DIASTOLIC ICA/CCA RATIO:  2.7 ECA:  110 cm/sec LEFT ICA:  100/38  cm/sec CCA:  116/24 cm/sec SYSTOLIC ICA/CCA RATIO:  0.9 DIASTOLIC ICA/CCA RATIO:  1.6 ECA:  118 cm/sec RIGHT CAROTID ARTERY: There is a minimal amount of mixed echogenic plaque within the right carotid bulb (images 16), extending to involve the origin and proximal aspects of the right internal carotid artery (image 24), not resulting in elevated peak systolic velocities within the interrogated course the right internal carotid artery to suggest a hemodynamically significant stenosis RIGHT VERTEBRAL ARTERY:  Antegrade flow LEFT CAROTID ARTERY: There is a minimal amount of mixed echogenic plaque within the left carotid bulb (image 50), extending to involve the origin and proximal aspect the left internal carotid artery (image 58), not resulting in elevated peak systolic velocities within the interrogated course the left internal carotid artery to suggest a hemodynamically significant stenosis. LEFT VERTEBRAL ARTERY:  Antegrade flow IMPRESSION: Minimal amount of bilateral atherosclerotic plaque, right subjectively greater than left, not resulting in a hemodynamically significant stenosis within either internal carotid artery. Electronically Signed   By: Simonne ComeJohn  Watts M.D.   On: 07/05/2017 08:28   Mr Maxine GlennMra Head/brain WUWo Cm  Result Date: 07/05/2017 CLINICAL DATA:  72 year old male with dizziness and right arm weakness. EXAM: MRI HEAD WITHOUT CONTRAST MRA HEAD WITHOUT CONTRAST TECHNIQUE: Multiplanar, multiecho pulse sequences of the brain and surrounding structures were obtained without intravenous contrast. Angiographic images of the head were obtained using MRA technique without contrast. COMPARISON:  Head CT without contrast 07/04/2017. FINDINGS: MRI HEAD FINDINGS Brain: No restricted diffusion to suggest acute infarction. No midline shift, mass effect, evidence of mass lesion, ventriculomegaly, extra-axial collection or acute intracranial hemorrhage. Cervicomedullary junction and pituitary are within normal limits.  Confluent periventricular and other scattered and patchy bilateral cerebral white matter T2 and FLAIR hyperintensity is moderate for age but in a nonspecific configuration. No cortical encephalomalacia or chronic cerebral blood products are identified. Mild for age signal heterogeneity in the deep gray matter nuclei and pons. Negative cerebellum. Vascular: Major intracranial vascular flow voids are preserved. Skull and upper cervical spine: Negative visible cervical spine. Normal bone marrow signal. Sinuses/Orbits: Normal orbits soft tissues. Trace paranasal sinus mucosal thickening. Other: Mastoid air cells are clear. Visible internal auditory structures appear normal. Scalp and face soft tissues appear negative. MRA HEAD FINDINGS Antegrade flow in the posterior circulation with codominant distal vertebral arteries. No distal vertebral stenosis. Normal right PICA origin. The left AICA is dominant. Patent vertebrobasilar junction and basilar artery without stenosis. Normal SCA and PCA origins. Posterior communicating arteries are diminutive or absent. Bilateral PCA branches are within normal limits. Antegrade flow in both ICA siphons. Tortuous distal cervical left ICA just below the skull base. No siphon stenosis. Ophthalmic artery origins are normal. Patent carotid termini. Normal MCA and ACA origins. Anterior communicating  artery and bilateral ACA branches are within normal limits. Early left MCA branching, normal variant. The left M1 and left MCA branches appear within normal limits. The right MCA M1 segment, bifurcation, and visible right MCA branches appear normal. IMPRESSION: 1.  No acute intracranial abnormality. 2. Moderate for age nonspecific signal changes primarily in the cerebral white matter, most commonly due to chronic small vessel disease. 3.  Negative intracranial MRA. Electronically Signed   By: Odessa Fleming M.D.   On: 07/05/2017 08:28    Microbiology: Recent Results (from the past 240 hour(s))    MRSA PCR Screening     Status: Abnormal   Collection Time: 07/04/17 10:59 PM  Result Value Ref Range Status   MRSA by PCR POSITIVE (A) NEGATIVE Final    Comment:        The GeneXpert MRSA Assay (FDA approved for NASAL specimens only), is one component of a comprehensive MRSA colonization surveillance program. It is not intended to diagnose MRSA infection nor to guide or monitor treatment for MRSA infections. RESULT CALLED TO, READ BACK BY AND VERIFIED WITH: THOMAS @ 0652 ON [id number] BY HENDERSON L. Performed at [facility], [address] [place], [place] [address]      Labs: Basic Metabolic Panel: Recent Labs  Lab 07/04/17 1725 07/05/17 0441  NA 138 137  K 4.7 4.2  CL 103 103  CO2 25 25  GLUCOSE 105* 68  BUN 28* 21*  CREATININE 1.72* 1.53*  CALCIUM 9.3 8.5*   Liver Function Tests: Recent Labs  Lab 07/04/17 1725 07/05/17 0441  AST 23 23  ALT 24 23  ALKPHOS 30* 23*  BILITOT 0.3 0.4  PROT 6.9 6.1*  ALBUMIN 3.7 3.1*   No results for input(s): LIPASE, AMYLASE in the last 168 hours. No results for input(s): AMMONIA in the last 168 hours. CBC: Recent Labs  Lab 07/04/17 1725 07/05/17 0441  WBC 6.6 5.0  NEUTROABS 5.1 3.3  HGB 9.6* 8.9*  HCT 30.4* 28.8*  MCV 84.2 84.2  PLT 247 227   Cardiac Enzymes: No results for input(s): CKTOTAL, CKMB, CKMBINDEX, TROPONINI in the last 168 hours. BNP: BNP (last 3 results) No results for input(s): BNP in the last 8760 hours.  ProBNP (last 3 results) No results for input(s): PROBNP in the last 8760 hours.  CBG: Recent Labs  Lab 07/04/17 2136 07/05/17 0825 07/05/17 0937 07/05/17 1122  GLUCAP 102* 56* 92 154*       Signed:  Hollice Espy, MD [facility] 07/05/2017, 3:01 PM

## 2017-07-05 NOTE — Progress Notes (Signed)
  Echocardiogram 2D Echocardiogram has been performed.  Douglas Cook, Douglas Cook 07/05/2017, 11:24 AM

## 2017-07-05 NOTE — Evaluation (Signed)
Physical Therapy Evaluation Patient Details Name: Douglas Cook MRN: [id number] DOB: 1945/06/02 Today's Date: 07/05/2017   History of Present Illness  Douglas Cook is a 72 y.o. 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  He denies slurred speech, blurred vision, gait instability or language comprehension difficulty.  Fever, chills, rhinorrhea, sore throat, dyspnea, productive cough, chest pain, palpitations, diaphoresis, PND, orthopnea or pitting edema of the lower extremities.  No abdominal pain, nausea, emesis, diarrhea, constipation, melena or hematochezia.  Denies dysuria, frequency or hematuria.  No polyphagia, polyuria or polydipsia.  Denies heat or cold intolerance.  Denies pruritus or skin rashes.    Clinical Impression  Patient functioning at baseline for functional mobility and gait.  Patient  demonstrates slightly labored slower than normal movement for sitting up and walking in hallway without loss of balance.  Plan:  Patient to discharge back to assisted living today and discharged from physical therapy to care of nursing for ambulation as tolerated for length of stay.     Follow Up Recommendations No PT follow up    Equipment Recommendations  None recommended by PT    Recommendations for Other Services       Precautions / Restrictions Precautions Precautions: None Restrictions Weight Bearing Restrictions: No      Mobility  Bed Mobility Overal bed mobility: Modified Independent             General bed mobility comments: slower than normal  Transfers Overall transfer level: Modified independent Equipment used: None             General transfer comment: slower than  normal  Ambulation/Gait Ambulation/Gait assistance: Supervision Ambulation Distance (Feet): 60 Feet Assistive device: None Gait Pattern/deviations: WFL(Within Functional Limits) Gait velocity: decreased Gait velocity interpretation: >2.62 ft/sec, indicative of community ambulatory General Gait Details: grossly WFL except slower than normal cadence  Stairs            Wheelchair Mobility    Modified Rankin (Stroke Patients Only)       Balance Overall balance assessment: Mild deficits observed, not formally tested                                           Pertinent Vitals/Pain Pain Assessment: No/denies pain    Home Living Family/patient expects to be discharged to:: Assisted living                 Additional Comments: has no personal DME    Prior Function Level of Independence: Needs assistance   Gait / Transfers Assistance Needed: ambulates household distances without assitive device  ADL's / Homemaking Assistance Needed: assisted by ALF staff        Hand Dominance        Extremity/Trunk Assessment   Upper Extremity Assessment Upper Extremity Assessment: Defer to OT evaluation    Lower Extremity Assessment Lower Extremity Assessment: Overall WFL for tasks assessed    Cervical / Trunk Assessment Cervical / Trunk Assessment: Normal  Communication   Communication: No difficulties  Cognition Arousal/Alertness: Awake/alert Behavior During Therapy: WFL for tasks assessed/performed Overall Cognitive Status: Within Functional Limits for tasks assessed  General Comments      Exercises     Assessment/Plan    PT Assessment Patent does not need any further PT services  PT Problem List         PT Treatment Interventions      PT Goals (Current goals can be found in the Care Plan section)  Acute Rehab PT Goals Patient Stated Goal: return home PT Goal Formulation:  With patient Time For Goal Achievement: Jul 31, 2017 Potential to Achieve Goals: Good    Frequency     Barriers to discharge        Co-evaluation               AM-PAC PT "6 Clicks" Daily Activity  Outcome Measure Difficulty turning over in bed (including adjusting bedclothes, sheets and blankets)?: None Difficulty moving from lying on back to sitting on the side of the bed? : None Difficulty sitting down on and standing up from a chair with arms (e.g., wheelchair, bedside commode, etc,.)?: None Help needed moving to and from a bed to chair (including a wheelchair)?: None Help needed walking in hospital room?: None Help needed climbing 3-5 steps with a railing? : A Little 6 Click Score: 23    End of Session   Activity Tolerance: Patient tolerated treatment well;Patient limited by fatigue Patient left: in bed;with call bell/phone within reach;with bed alarm set Nurse Communication: Mobility status PT Visit Diagnosis: Unsteadiness on feet (R26.81);Other abnormalities of gait and mobility (R26.89);Muscle weakness (generalized) (M62.81)    Time: 1610-9604 PT Time Calculation (min) (ACUTE ONLY): 23 min   Charges:   PT Evaluation $PT Eval Low Complexity: 1 Low PT Treatments $Therapeutic Activity: 23-37 mins   PT G Codes:        11:30 AM, 31-Jul-2017 Ocie Bob, MPT Physical Therapist with [facility] 336 [phone] office [phone] mobile phone

## 2017-07-05 NOTE — Progress Notes (Signed)
OT Cancellation Note  Patient Details Name: Douglas Cook MRN: [id number] DOB: Dec 26, 1945   Cancelled Treatment:    Reason Eval/Treat Not Completed: Patient at procedure or test/ unavailable. Pt off floor for MRI/US this am, will check back at a later time.    Ezra SitesLeslie Troxler, OTR/L  [phone] 07/05/2017, 7:43 AM

## 2017-07-05 NOTE — Progress Notes (Signed)
Night shift floor coverage note.  The patient had 38.3 C (100.9 F) temperature earlier this morning.  No headache, sore throat, productive cough, loose stools or urinary symptoms so far.  Lactic acid, repeat UA, CBC with differential and CMP along with a 1 L normal saline bolus ordered.  Will be following lab results and adjusting treatment as needed.  Sanda Kleinavid Aavya Shafer, MD

## 2017-07-15 DIAGNOSIS — E7800 Pure hypercholesterolemia, unspecified: Secondary | ICD-10-CM | POA: Diagnosis not present

## 2017-07-15 DIAGNOSIS — E119 Type 2 diabetes mellitus without complications: Secondary | ICD-10-CM | POA: Diagnosis not present

## 2017-07-15 DIAGNOSIS — J449 Chronic obstructive pulmonary disease, unspecified: Secondary | ICD-10-CM | POA: Diagnosis not present

## 2017-07-16 DIAGNOSIS — B351 Tinea unguium: Secondary | ICD-10-CM | POA: Diagnosis not present

## 2017-07-16 DIAGNOSIS — M79675 Pain in left toe(s): Secondary | ICD-10-CM | POA: Diagnosis not present

## 2017-07-16 DIAGNOSIS — M79674 Pain in right toe(s): Secondary | ICD-10-CM | POA: Diagnosis not present

## 2017-07-19 DIAGNOSIS — E1165 Type 2 diabetes mellitus with hyperglycemia: Secondary | ICD-10-CM | POA: Diagnosis not present

## 2017-07-19 DIAGNOSIS — J449 Chronic obstructive pulmonary disease, unspecified: Secondary | ICD-10-CM | POA: Diagnosis not present

## 2017-07-19 DIAGNOSIS — Z6829 Body mass index (BMI) 29.0-29.9, adult: Secondary | ICD-10-CM | POA: Diagnosis not present

## 2017-07-19 DIAGNOSIS — Z713 Dietary counseling and surveillance: Secondary | ICD-10-CM | POA: Diagnosis not present

## 2017-07-19 DIAGNOSIS — Z299 Encounter for prophylactic measures, unspecified: Secondary | ICD-10-CM | POA: Diagnosis not present

## 2017-08-08 DIAGNOSIS — E7800 Pure hypercholesterolemia, unspecified: Secondary | ICD-10-CM | POA: Diagnosis not present

## 2017-08-08 DIAGNOSIS — E119 Type 2 diabetes mellitus without complications: Secondary | ICD-10-CM | POA: Diagnosis not present

## 2017-08-08 DIAGNOSIS — J449 Chronic obstructive pulmonary disease, unspecified: Secondary | ICD-10-CM | POA: Diagnosis not present

## 2017-08-15 DIAGNOSIS — Z794 Long term (current) use of insulin: Secondary | ICD-10-CM | POA: Diagnosis not present

## 2017-08-15 DIAGNOSIS — H25813 Combined forms of age-related cataract, bilateral: Secondary | ICD-10-CM | POA: Diagnosis not present

## 2017-08-15 DIAGNOSIS — H524 Presbyopia: Secondary | ICD-10-CM | POA: Diagnosis not present

## 2017-08-15 DIAGNOSIS — E119 Type 2 diabetes mellitus without complications: Secondary | ICD-10-CM | POA: Diagnosis not present

## 2017-08-15 DIAGNOSIS — Z7984 Long term (current) use of oral hypoglycemic drugs: Secondary | ICD-10-CM | POA: Diagnosis not present

## 2017-08-27 DIAGNOSIS — Z6829 Body mass index (BMI) 29.0-29.9, adult: Secondary | ICD-10-CM | POA: Diagnosis not present

## 2017-08-27 DIAGNOSIS — J449 Chronic obstructive pulmonary disease, unspecified: Secondary | ICD-10-CM | POA: Diagnosis not present

## 2017-08-27 DIAGNOSIS — Z299 Encounter for prophylactic measures, unspecified: Secondary | ICD-10-CM | POA: Diagnosis not present

## 2017-08-27 DIAGNOSIS — M1990 Unspecified osteoarthritis, unspecified site: Secondary | ICD-10-CM | POA: Diagnosis not present

## 2017-08-27 DIAGNOSIS — E1165 Type 2 diabetes mellitus with hyperglycemia: Secondary | ICD-10-CM | POA: Diagnosis not present

## 2017-09-09 DIAGNOSIS — H524 Presbyopia: Secondary | ICD-10-CM | POA: Diagnosis not present

## 2017-09-09 DIAGNOSIS — H5203 Hypermetropia, bilateral: Secondary | ICD-10-CM | POA: Diagnosis not present

## 2017-09-17 DIAGNOSIS — M79675 Pain in left toe(s): Secondary | ICD-10-CM | POA: Diagnosis not present

## 2017-09-17 DIAGNOSIS — B351 Tinea unguium: Secondary | ICD-10-CM | POA: Diagnosis not present

## 2017-09-17 DIAGNOSIS — M79674 Pain in right toe(s): Secondary | ICD-10-CM | POA: Diagnosis not present

## 2017-10-16 DIAGNOSIS — J449 Chronic obstructive pulmonary disease, unspecified: Secondary | ICD-10-CM | POA: Diagnosis not present

## 2017-10-16 DIAGNOSIS — E119 Type 2 diabetes mellitus without complications: Secondary | ICD-10-CM | POA: Diagnosis not present

## 2017-10-16 DIAGNOSIS — E7800 Pure hypercholesterolemia, unspecified: Secondary | ICD-10-CM | POA: Diagnosis not present

## 2017-11-13 DIAGNOSIS — E7800 Pure hypercholesterolemia, unspecified: Secondary | ICD-10-CM | POA: Diagnosis not present

## 2017-11-13 DIAGNOSIS — J449 Chronic obstructive pulmonary disease, unspecified: Secondary | ICD-10-CM | POA: Diagnosis not present

## 2017-11-13 DIAGNOSIS — E119 Type 2 diabetes mellitus without complications: Secondary | ICD-10-CM | POA: Diagnosis not present

## 2017-11-14 DIAGNOSIS — Z1331 Encounter for screening for depression: Secondary | ICD-10-CM | POA: Diagnosis not present

## 2017-11-14 DIAGNOSIS — Z7189 Other specified counseling: Secondary | ICD-10-CM | POA: Diagnosis not present

## 2017-11-14 DIAGNOSIS — Z79899 Other long term (current) drug therapy: Secondary | ICD-10-CM | POA: Diagnosis not present

## 2017-11-14 DIAGNOSIS — Z1211 Encounter for screening for malignant neoplasm of colon: Secondary | ICD-10-CM | POA: Diagnosis not present

## 2017-11-14 DIAGNOSIS — E1165 Type 2 diabetes mellitus with hyperglycemia: Secondary | ICD-10-CM | POA: Diagnosis not present

## 2017-11-14 DIAGNOSIS — Z6829 Body mass index (BMI) 29.0-29.9, adult: Secondary | ICD-10-CM | POA: Diagnosis not present

## 2017-11-14 DIAGNOSIS — Z1339 Encounter for screening examination for other mental health and behavioral disorders: Secondary | ICD-10-CM | POA: Diagnosis not present

## 2017-11-14 DIAGNOSIS — Z0000 Encounter for general adult medical examination without abnormal findings: Secondary | ICD-10-CM | POA: Diagnosis not present

## 2017-11-14 DIAGNOSIS — Z125 Encounter for screening for malignant neoplasm of prostate: Secondary | ICD-10-CM | POA: Diagnosis not present

## 2017-11-14 DIAGNOSIS — E7800 Pure hypercholesterolemia, unspecified: Secondary | ICD-10-CM | POA: Diagnosis not present

## 2017-11-14 DIAGNOSIS — F329 Major depressive disorder, single episode, unspecified: Secondary | ICD-10-CM | POA: Diagnosis not present

## 2017-11-14 DIAGNOSIS — J449 Chronic obstructive pulmonary disease, unspecified: Secondary | ICD-10-CM | POA: Diagnosis not present

## 2017-11-14 DIAGNOSIS — R5383 Other fatigue: Secondary | ICD-10-CM | POA: Diagnosis not present

## 2017-11-14 DIAGNOSIS — Z87891 Personal history of nicotine dependence: Secondary | ICD-10-CM | POA: Diagnosis not present

## 2017-11-14 DIAGNOSIS — Z299 Encounter for prophylactic measures, unspecified: Secondary | ICD-10-CM | POA: Diagnosis not present

## 2017-11-19 DIAGNOSIS — M79674 Pain in right toe(s): Secondary | ICD-10-CM | POA: Diagnosis not present

## 2017-11-19 DIAGNOSIS — B351 Tinea unguium: Secondary | ICD-10-CM | POA: Diagnosis not present

## 2017-11-19 DIAGNOSIS — M79675 Pain in left toe(s): Secondary | ICD-10-CM | POA: Diagnosis not present

## 2017-11-26 DIAGNOSIS — E7800 Pure hypercholesterolemia, unspecified: Secondary | ICD-10-CM | POA: Diagnosis not present

## 2017-11-26 DIAGNOSIS — E119 Type 2 diabetes mellitus without complications: Secondary | ICD-10-CM | POA: Diagnosis not present

## 2017-11-26 DIAGNOSIS — J449 Chronic obstructive pulmonary disease, unspecified: Secondary | ICD-10-CM | POA: Diagnosis not present

## 2018-01-06 DIAGNOSIS — J449 Chronic obstructive pulmonary disease, unspecified: Secondary | ICD-10-CM | POA: Diagnosis not present

## 2018-01-06 DIAGNOSIS — E7800 Pure hypercholesterolemia, unspecified: Secondary | ICD-10-CM | POA: Diagnosis not present

## 2018-01-06 DIAGNOSIS — E119 Type 2 diabetes mellitus without complications: Secondary | ICD-10-CM | POA: Diagnosis not present

## 2018-01-14 DIAGNOSIS — Z23 Encounter for immunization: Secondary | ICD-10-CM | POA: Diagnosis not present

## 2018-02-05 DIAGNOSIS — E7800 Pure hypercholesterolemia, unspecified: Secondary | ICD-10-CM | POA: Diagnosis not present

## 2018-02-05 DIAGNOSIS — E119 Type 2 diabetes mellitus without complications: Secondary | ICD-10-CM | POA: Diagnosis not present

## 2018-02-05 DIAGNOSIS — J449 Chronic obstructive pulmonary disease, unspecified: Secondary | ICD-10-CM | POA: Diagnosis not present

## 2018-02-12 DIAGNOSIS — Z794 Long term (current) use of insulin: Secondary | ICD-10-CM | POA: Diagnosis not present

## 2018-02-12 DIAGNOSIS — H25813 Combined forms of age-related cataract, bilateral: Secondary | ICD-10-CM | POA: Diagnosis not present

## 2018-02-12 DIAGNOSIS — Z7984 Long term (current) use of oral hypoglycemic drugs: Secondary | ICD-10-CM | POA: Diagnosis not present

## 2018-02-12 DIAGNOSIS — E119 Type 2 diabetes mellitus without complications: Secondary | ICD-10-CM | POA: Diagnosis not present

## 2018-02-25 DIAGNOSIS — J449 Chronic obstructive pulmonary disease, unspecified: Secondary | ICD-10-CM | POA: Diagnosis not present

## 2018-02-25 DIAGNOSIS — Z299 Encounter for prophylactic measures, unspecified: Secondary | ICD-10-CM | POA: Diagnosis not present

## 2018-02-25 DIAGNOSIS — E1165 Type 2 diabetes mellitus with hyperglycemia: Secondary | ICD-10-CM | POA: Diagnosis not present

## 2018-02-25 DIAGNOSIS — Z6828 Body mass index (BMI) 28.0-28.9, adult: Secondary | ICD-10-CM | POA: Diagnosis not present

## 2018-04-10 DIAGNOSIS — E119 Type 2 diabetes mellitus without complications: Secondary | ICD-10-CM | POA: Diagnosis not present

## 2018-04-10 DIAGNOSIS — E7800 Pure hypercholesterolemia, unspecified: Secondary | ICD-10-CM | POA: Diagnosis not present

## 2018-04-10 DIAGNOSIS — J449 Chronic obstructive pulmonary disease, unspecified: Secondary | ICD-10-CM | POA: Diagnosis not present

## 2018-04-17 DIAGNOSIS — Z299 Encounter for prophylactic measures, unspecified: Secondary | ICD-10-CM | POA: Diagnosis not present

## 2018-04-17 DIAGNOSIS — Z6828 Body mass index (BMI) 28.0-28.9, adult: Secondary | ICD-10-CM | POA: Diagnosis not present

## 2018-04-17 DIAGNOSIS — J449 Chronic obstructive pulmonary disease, unspecified: Secondary | ICD-10-CM | POA: Diagnosis not present

## 2018-04-17 DIAGNOSIS — E1165 Type 2 diabetes mellitus with hyperglycemia: Secondary | ICD-10-CM | POA: Diagnosis not present

## 2018-04-17 DIAGNOSIS — Z87891 Personal history of nicotine dependence: Secondary | ICD-10-CM | POA: Diagnosis not present

## 2018-04-17 DIAGNOSIS — J320 Chronic maxillary sinusitis: Secondary | ICD-10-CM | POA: Diagnosis not present

## 2018-04-29 DIAGNOSIS — M79675 Pain in left toe(s): Secondary | ICD-10-CM | POA: Diagnosis not present

## 2018-04-29 DIAGNOSIS — B351 Tinea unguium: Secondary | ICD-10-CM | POA: Diagnosis not present

## 2018-04-29 DIAGNOSIS — M79674 Pain in right toe(s): Secondary | ICD-10-CM | POA: Diagnosis not present

## 2018-05-05 DIAGNOSIS — M6281 Muscle weakness (generalized): Secondary | ICD-10-CM | POA: Diagnosis not present

## 2018-05-08 DIAGNOSIS — J449 Chronic obstructive pulmonary disease, unspecified: Secondary | ICD-10-CM | POA: Diagnosis not present

## 2018-05-08 DIAGNOSIS — E119 Type 2 diabetes mellitus without complications: Secondary | ICD-10-CM | POA: Diagnosis not present

## 2018-05-08 DIAGNOSIS — E7800 Pure hypercholesterolemia, unspecified: Secondary | ICD-10-CM | POA: Diagnosis not present

## 2018-05-13 DIAGNOSIS — M6281 Muscle weakness (generalized): Secondary | ICD-10-CM | POA: Diagnosis not present

## 2018-05-15 DIAGNOSIS — M6281 Muscle weakness (generalized): Secondary | ICD-10-CM | POA: Diagnosis not present

## 2018-05-23 DIAGNOSIS — M6281 Muscle weakness (generalized): Secondary | ICD-10-CM | POA: Diagnosis not present

## 2018-05-27 DIAGNOSIS — M6281 Muscle weakness (generalized): Secondary | ICD-10-CM | POA: Diagnosis not present

## 2018-05-30 DIAGNOSIS — M6281 Muscle weakness (generalized): Secondary | ICD-10-CM | POA: Diagnosis not present

## 2018-06-03 DIAGNOSIS — M6281 Muscle weakness (generalized): Secondary | ICD-10-CM | POA: Diagnosis not present

## 2018-06-03 DIAGNOSIS — Z299 Encounter for prophylactic measures, unspecified: Secondary | ICD-10-CM | POA: Diagnosis not present

## 2018-06-03 DIAGNOSIS — M1990 Unspecified osteoarthritis, unspecified site: Secondary | ICD-10-CM | POA: Diagnosis not present

## 2018-06-03 DIAGNOSIS — R42 Dizziness and giddiness: Secondary | ICD-10-CM | POA: Diagnosis not present

## 2018-06-03 DIAGNOSIS — E1165 Type 2 diabetes mellitus with hyperglycemia: Secondary | ICD-10-CM | POA: Diagnosis not present

## 2018-06-03 DIAGNOSIS — Z6828 Body mass index (BMI) 28.0-28.9, adult: Secondary | ICD-10-CM | POA: Diagnosis not present

## 2018-06-03 DIAGNOSIS — J449 Chronic obstructive pulmonary disease, unspecified: Secondary | ICD-10-CM | POA: Diagnosis not present

## 2018-06-06 DIAGNOSIS — M6281 Muscle weakness (generalized): Secondary | ICD-10-CM | POA: Diagnosis not present

## 2018-06-09 DIAGNOSIS — E7800 Pure hypercholesterolemia, unspecified: Secondary | ICD-10-CM | POA: Diagnosis not present

## 2018-06-09 DIAGNOSIS — E119 Type 2 diabetes mellitus without complications: Secondary | ICD-10-CM | POA: Diagnosis not present

## 2018-06-09 DIAGNOSIS — J449 Chronic obstructive pulmonary disease, unspecified: Secondary | ICD-10-CM | POA: Diagnosis not present

## 2018-06-17 DIAGNOSIS — M6281 Muscle weakness (generalized): Secondary | ICD-10-CM | POA: Diagnosis not present

## 2018-06-19 DIAGNOSIS — M6281 Muscle weakness (generalized): Secondary | ICD-10-CM | POA: Diagnosis not present

## 2018-06-24 DIAGNOSIS — M6281 Muscle weakness (generalized): Secondary | ICD-10-CM | POA: Diagnosis not present

## 2018-06-25 DIAGNOSIS — M6281 Muscle weakness (generalized): Secondary | ICD-10-CM | POA: Diagnosis not present

## 2018-07-01 DIAGNOSIS — M6281 Muscle weakness (generalized): Secondary | ICD-10-CM | POA: Diagnosis not present

## 2018-07-02 DIAGNOSIS — M6281 Muscle weakness (generalized): Secondary | ICD-10-CM | POA: Diagnosis not present

## 2018-07-07 DIAGNOSIS — M6281 Muscle weakness (generalized): Secondary | ICD-10-CM | POA: Diagnosis not present

## 2018-07-11 DIAGNOSIS — E7800 Pure hypercholesterolemia, unspecified: Secondary | ICD-10-CM | POA: Diagnosis not present

## 2018-07-11 DIAGNOSIS — E119 Type 2 diabetes mellitus without complications: Secondary | ICD-10-CM | POA: Diagnosis not present

## 2018-07-11 DIAGNOSIS — J449 Chronic obstructive pulmonary disease, unspecified: Secondary | ICD-10-CM | POA: Diagnosis not present

## 2018-07-16 DIAGNOSIS — R2689 Other abnormalities of gait and mobility: Secondary | ICD-10-CM | POA: Diagnosis not present

## 2018-07-16 DIAGNOSIS — M6281 Muscle weakness (generalized): Secondary | ICD-10-CM | POA: Diagnosis not present

## 2018-07-17 DIAGNOSIS — R2689 Other abnormalities of gait and mobility: Secondary | ICD-10-CM | POA: Diagnosis not present

## 2018-07-17 DIAGNOSIS — M6281 Muscle weakness (generalized): Secondary | ICD-10-CM | POA: Diagnosis not present

## 2018-07-22 DIAGNOSIS — R2689 Other abnormalities of gait and mobility: Secondary | ICD-10-CM | POA: Diagnosis not present

## 2018-07-22 DIAGNOSIS — M6281 Muscle weakness (generalized): Secondary | ICD-10-CM | POA: Diagnosis not present

## 2018-07-23 DIAGNOSIS — R2689 Other abnormalities of gait and mobility: Secondary | ICD-10-CM | POA: Diagnosis not present

## 2018-07-23 DIAGNOSIS — M6281 Muscle weakness (generalized): Secondary | ICD-10-CM | POA: Diagnosis not present

## 2018-07-29 DIAGNOSIS — M6281 Muscle weakness (generalized): Secondary | ICD-10-CM | POA: Diagnosis not present

## 2018-07-29 DIAGNOSIS — R2689 Other abnormalities of gait and mobility: Secondary | ICD-10-CM | POA: Diagnosis not present

## 2018-07-31 DIAGNOSIS — M6281 Muscle weakness (generalized): Secondary | ICD-10-CM | POA: Diagnosis not present

## 2018-07-31 DIAGNOSIS — R2689 Other abnormalities of gait and mobility: Secondary | ICD-10-CM | POA: Diagnosis not present

## 2018-08-05 DIAGNOSIS — R2689 Other abnormalities of gait and mobility: Secondary | ICD-10-CM | POA: Diagnosis not present

## 2018-08-05 DIAGNOSIS — M6281 Muscle weakness (generalized): Secondary | ICD-10-CM | POA: Diagnosis not present

## 2018-08-07 DIAGNOSIS — E7800 Pure hypercholesterolemia, unspecified: Secondary | ICD-10-CM | POA: Diagnosis not present

## 2018-08-07 DIAGNOSIS — M6281 Muscle weakness (generalized): Secondary | ICD-10-CM | POA: Diagnosis not present

## 2018-08-07 DIAGNOSIS — E119 Type 2 diabetes mellitus without complications: Secondary | ICD-10-CM | POA: Diagnosis not present

## 2018-08-07 DIAGNOSIS — R2689 Other abnormalities of gait and mobility: Secondary | ICD-10-CM | POA: Diagnosis not present

## 2018-08-07 DIAGNOSIS — B351 Tinea unguium: Secondary | ICD-10-CM | POA: Diagnosis not present

## 2018-08-07 DIAGNOSIS — M79675 Pain in left toe(s): Secondary | ICD-10-CM | POA: Diagnosis not present

## 2018-08-07 DIAGNOSIS — M79674 Pain in right toe(s): Secondary | ICD-10-CM | POA: Diagnosis not present

## 2018-08-07 DIAGNOSIS — J449 Chronic obstructive pulmonary disease, unspecified: Secondary | ICD-10-CM | POA: Diagnosis not present

## 2018-08-12 DIAGNOSIS — R2689 Other abnormalities of gait and mobility: Secondary | ICD-10-CM | POA: Diagnosis not present

## 2018-08-12 DIAGNOSIS — M6281 Muscle weakness (generalized): Secondary | ICD-10-CM | POA: Diagnosis not present

## 2018-08-14 DIAGNOSIS — M6281 Muscle weakness (generalized): Secondary | ICD-10-CM | POA: Diagnosis not present

## 2018-08-14 DIAGNOSIS — R2689 Other abnormalities of gait and mobility: Secondary | ICD-10-CM | POA: Diagnosis not present

## 2018-08-26 DIAGNOSIS — J449 Chronic obstructive pulmonary disease, unspecified: Secondary | ICD-10-CM | POA: Diagnosis not present

## 2018-08-26 DIAGNOSIS — Z299 Encounter for prophylactic measures, unspecified: Secondary | ICD-10-CM | POA: Diagnosis not present

## 2018-08-26 DIAGNOSIS — E7800 Pure hypercholesterolemia, unspecified: Secondary | ICD-10-CM | POA: Diagnosis not present

## 2018-08-26 DIAGNOSIS — E1165 Type 2 diabetes mellitus with hyperglycemia: Secondary | ICD-10-CM | POA: Diagnosis not present

## 2018-09-04 DIAGNOSIS — E119 Type 2 diabetes mellitus without complications: Secondary | ICD-10-CM | POA: Diagnosis not present

## 2018-09-04 DIAGNOSIS — J449 Chronic obstructive pulmonary disease, unspecified: Secondary | ICD-10-CM | POA: Diagnosis not present

## 2018-09-04 DIAGNOSIS — E7800 Pure hypercholesterolemia, unspecified: Secondary | ICD-10-CM | POA: Diagnosis not present

## 2018-10-02 DIAGNOSIS — J449 Chronic obstructive pulmonary disease, unspecified: Secondary | ICD-10-CM | POA: Diagnosis not present

## 2018-10-02 DIAGNOSIS — E7800 Pure hypercholesterolemia, unspecified: Secondary | ICD-10-CM | POA: Diagnosis not present

## 2018-10-02 DIAGNOSIS — E119 Type 2 diabetes mellitus without complications: Secondary | ICD-10-CM | POA: Diagnosis not present

## 2018-10-09 DIAGNOSIS — M79674 Pain in right toe(s): Secondary | ICD-10-CM | POA: Diagnosis not present

## 2018-10-09 DIAGNOSIS — M79675 Pain in left toe(s): Secondary | ICD-10-CM | POA: Diagnosis not present

## 2018-10-09 DIAGNOSIS — B351 Tinea unguium: Secondary | ICD-10-CM | POA: Diagnosis not present

## 2018-10-10 DIAGNOSIS — Z7984 Long term (current) use of oral hypoglycemic drugs: Secondary | ICD-10-CM | POA: Diagnosis not present

## 2018-10-10 DIAGNOSIS — Z794 Long term (current) use of insulin: Secondary | ICD-10-CM | POA: Diagnosis not present

## 2018-10-10 DIAGNOSIS — H25813 Combined forms of age-related cataract, bilateral: Secondary | ICD-10-CM | POA: Diagnosis not present

## 2018-10-10 DIAGNOSIS — E119 Type 2 diabetes mellitus without complications: Secondary | ICD-10-CM | POA: Diagnosis not present

## 2018-10-30 DIAGNOSIS — E7800 Pure hypercholesterolemia, unspecified: Secondary | ICD-10-CM | POA: Diagnosis not present

## 2018-10-30 DIAGNOSIS — J449 Chronic obstructive pulmonary disease, unspecified: Secondary | ICD-10-CM | POA: Diagnosis not present

## 2018-10-30 DIAGNOSIS — E119 Type 2 diabetes mellitus without complications: Secondary | ICD-10-CM | POA: Diagnosis not present

## 2018-12-02 DIAGNOSIS — E7800 Pure hypercholesterolemia, unspecified: Secondary | ICD-10-CM | POA: Diagnosis not present

## 2018-12-02 DIAGNOSIS — Z6829 Body mass index (BMI) 29.0-29.9, adult: Secondary | ICD-10-CM | POA: Diagnosis not present

## 2018-12-02 DIAGNOSIS — Z1331 Encounter for screening for depression: Secondary | ICD-10-CM | POA: Diagnosis not present

## 2018-12-02 DIAGNOSIS — E1165 Type 2 diabetes mellitus with hyperglycemia: Secondary | ICD-10-CM | POA: Diagnosis not present

## 2018-12-02 DIAGNOSIS — Z7189 Other specified counseling: Secondary | ICD-10-CM | POA: Diagnosis not present

## 2018-12-02 DIAGNOSIS — Z79899 Other long term (current) drug therapy: Secondary | ICD-10-CM | POA: Diagnosis not present

## 2018-12-02 DIAGNOSIS — Z1339 Encounter for screening examination for other mental health and behavioral disorders: Secondary | ICD-10-CM | POA: Diagnosis not present

## 2018-12-02 DIAGNOSIS — Z299 Encounter for prophylactic measures, unspecified: Secondary | ICD-10-CM | POA: Diagnosis not present

## 2018-12-02 DIAGNOSIS — Z125 Encounter for screening for malignant neoplasm of prostate: Secondary | ICD-10-CM | POA: Diagnosis not present

## 2018-12-02 DIAGNOSIS — Z0000 Encounter for general adult medical examination without abnormal findings: Secondary | ICD-10-CM | POA: Diagnosis not present

## 2018-12-02 DIAGNOSIS — R5383 Other fatigue: Secondary | ICD-10-CM | POA: Diagnosis not present

## 2018-12-02 DIAGNOSIS — Z1211 Encounter for screening for malignant neoplasm of colon: Secondary | ICD-10-CM | POA: Diagnosis not present

## 2018-12-04 DIAGNOSIS — E119 Type 2 diabetes mellitus without complications: Secondary | ICD-10-CM | POA: Diagnosis not present

## 2018-12-04 DIAGNOSIS — J449 Chronic obstructive pulmonary disease, unspecified: Secondary | ICD-10-CM | POA: Diagnosis not present

## 2018-12-04 DIAGNOSIS — E7800 Pure hypercholesterolemia, unspecified: Secondary | ICD-10-CM | POA: Diagnosis not present

## 2018-12-16 DIAGNOSIS — E1140 Type 2 diabetes mellitus with diabetic neuropathy, unspecified: Secondary | ICD-10-CM | POA: Diagnosis not present

## 2018-12-16 DIAGNOSIS — E1159 Type 2 diabetes mellitus with other circulatory complications: Secondary | ICD-10-CM | POA: Diagnosis not present

## 2018-12-23 DIAGNOSIS — B351 Tinea unguium: Secondary | ICD-10-CM | POA: Diagnosis not present

## 2018-12-23 DIAGNOSIS — M79674 Pain in right toe(s): Secondary | ICD-10-CM | POA: Diagnosis not present

## 2018-12-23 DIAGNOSIS — M79675 Pain in left toe(s): Secondary | ICD-10-CM | POA: Diagnosis not present

## 2018-12-30 DIAGNOSIS — Z713 Dietary counseling and surveillance: Secondary | ICD-10-CM | POA: Diagnosis not present

## 2018-12-30 DIAGNOSIS — J449 Chronic obstructive pulmonary disease, unspecified: Secondary | ICD-10-CM | POA: Diagnosis not present

## 2018-12-30 DIAGNOSIS — Z299 Encounter for prophylactic measures, unspecified: Secondary | ICD-10-CM | POA: Diagnosis not present

## 2018-12-30 DIAGNOSIS — E1165 Type 2 diabetes mellitus with hyperglycemia: Secondary | ICD-10-CM | POA: Diagnosis not present

## 2019-01-21 DIAGNOSIS — E7800 Pure hypercholesterolemia, unspecified: Secondary | ICD-10-CM | POA: Diagnosis not present

## 2019-01-21 DIAGNOSIS — J449 Chronic obstructive pulmonary disease, unspecified: Secondary | ICD-10-CM | POA: Diagnosis not present

## 2019-01-21 DIAGNOSIS — E119 Type 2 diabetes mellitus without complications: Secondary | ICD-10-CM | POA: Diagnosis not present

## 2019-01-29 DIAGNOSIS — D649 Anemia, unspecified: Secondary | ICD-10-CM | POA: Diagnosis not present

## 2019-01-29 DIAGNOSIS — J449 Chronic obstructive pulmonary disease, unspecified: Secondary | ICD-10-CM | POA: Diagnosis not present

## 2019-01-29 DIAGNOSIS — Z6829 Body mass index (BMI) 29.0-29.9, adult: Secondary | ICD-10-CM | POA: Diagnosis not present

## 2019-01-29 DIAGNOSIS — E7800 Pure hypercholesterolemia, unspecified: Secondary | ICD-10-CM | POA: Diagnosis not present

## 2019-01-29 DIAGNOSIS — Z299 Encounter for prophylactic measures, unspecified: Secondary | ICD-10-CM | POA: Diagnosis not present

## 2019-01-29 DIAGNOSIS — E1165 Type 2 diabetes mellitus with hyperglycemia: Secondary | ICD-10-CM | POA: Diagnosis not present

## 2019-02-24 DIAGNOSIS — B351 Tinea unguium: Secondary | ICD-10-CM | POA: Diagnosis not present

## 2019-02-24 DIAGNOSIS — M79674 Pain in right toe(s): Secondary | ICD-10-CM | POA: Diagnosis not present

## 2019-02-24 DIAGNOSIS — M79675 Pain in left toe(s): Secondary | ICD-10-CM | POA: Diagnosis not present

## 2019-02-26 DIAGNOSIS — U071 COVID-19: Secondary | ICD-10-CM | POA: Diagnosis not present

## 2019-02-26 DIAGNOSIS — Z20828 Contact with and (suspected) exposure to other viral communicable diseases: Secondary | ICD-10-CM | POA: Diagnosis not present

## 2019-03-02 DIAGNOSIS — Z20828 Contact with and (suspected) exposure to other viral communicable diseases: Secondary | ICD-10-CM | POA: Diagnosis not present

## 2019-03-02 DIAGNOSIS — U071 COVID-19: Secondary | ICD-10-CM | POA: Diagnosis not present

## 2019-03-09 ENCOUNTER — Emergency Department (HOSPITAL_COMMUNITY): Payer: Medicare Other

## 2019-03-09 ENCOUNTER — Other Ambulatory Visit: Payer: Self-pay

## 2019-03-09 ENCOUNTER — Encounter (HOSPITAL_COMMUNITY): Payer: Self-pay | Admitting: *Deleted

## 2019-03-09 ENCOUNTER — Emergency Department (HOSPITAL_COMMUNITY)
Admission: EM | Admit: 2019-03-09 | Discharge: 2019-03-09 | Disposition: A | Discharge status: Home / Self Care | Payer: Medicare Other | Attending: Emergency Medicine | Admitting: Emergency Medicine

## 2019-03-09 DIAGNOSIS — Y92122 Bedroom in nursing home as the place of occurrence of the external cause: Secondary | ICD-10-CM | POA: Insufficient documentation

## 2019-03-09 DIAGNOSIS — F17210 Nicotine dependence, cigarettes, uncomplicated: Secondary | ICD-10-CM | POA: Diagnosis not present

## 2019-03-09 DIAGNOSIS — S3992XA Unspecified injury of lower back, initial encounter: Secondary | ICD-10-CM | POA: Diagnosis present

## 2019-03-09 DIAGNOSIS — N183 Chronic kidney disease, stage 3 unspecified: Secondary | ICD-10-CM | POA: Insufficient documentation

## 2019-03-09 DIAGNOSIS — Y939 Activity, unspecified: Secondary | ICD-10-CM | POA: Insufficient documentation

## 2019-03-09 DIAGNOSIS — E119 Type 2 diabetes mellitus without complications: Secondary | ICD-10-CM | POA: Insufficient documentation

## 2019-03-09 DIAGNOSIS — Z7984 Long term (current) use of oral hypoglycemic drugs: Secondary | ICD-10-CM | POA: Diagnosis not present

## 2019-03-09 DIAGNOSIS — W19XXXA Unspecified fall, initial encounter: Secondary | ICD-10-CM

## 2019-03-09 DIAGNOSIS — Z7982 Long term (current) use of aspirin: Secondary | ICD-10-CM | POA: Insufficient documentation

## 2019-03-09 DIAGNOSIS — S300XXA Contusion of lower back and pelvis, initial encounter: Secondary | ICD-10-CM | POA: Insufficient documentation

## 2019-03-09 DIAGNOSIS — Y999 Unspecified external cause status: Secondary | ICD-10-CM | POA: Diagnosis not present

## 2019-03-09 DIAGNOSIS — J449 Chronic obstructive pulmonary disease, unspecified: Secondary | ICD-10-CM | POA: Insufficient documentation

## 2019-03-09 DIAGNOSIS — Z79899 Other long term (current) drug therapy: Secondary | ICD-10-CM | POA: Insufficient documentation

## 2019-03-09 DIAGNOSIS — W06XXXA Fall from bed, initial encounter: Secondary | ICD-10-CM | POA: Diagnosis not present

## 2019-03-09 LAB — CBG MONITORING, ED: Glucose-Capillary: 141 mg/dL — ABNORMAL HIGH (ref 70–99)

## 2019-03-09 NOTE — Discharge Instructions (Signed)
Please take Tylenol or ibuprofen for your pain as needed.  Your x-rays thankfully appear normal with no signs of fractures or injuries to the spine.  This may hurt for the next couple of days but should gradually improve.  See your doctor in 1 week if no improvement

## 2019-03-09 NOTE — ED Triage Notes (Signed)
Pt is a resident of [facility] and states that he slid off the bed onto the floor this am while trying to get to "his pee bucket" pt alert, denies any injury, [facility] staff advised that pt would need to be seen by edp

## 2019-03-09 NOTE — ED Provider Notes (Signed)
[facility] EMERGENCY DEPARTMENT Provider Note   CSN: [id number] Arrival date & time: 03/09/19  0546     History Chief Complaint  Patient presents with  . Fall    Douglas Cook is a 73 y.o. male.  HPI     This patient is a 73 year old male with a known history of diabetes, COPD, stage III chronic kidney disease.  He is currently living at [facility] nursing facility and states that when he was getting out of bed to try to urinate this morning he slid off the front of the bed and went to the ground landing on his bottom and his lower back.  He has had back pain since that time.  This occurred approximately 1 hour ago.  He arrives by EMS transport for evaluation for his back pain secondary to the fall.  He very clearly remembers how he fell, where he fell and why he fell which she states was losing his balance and slipping off the bottom of the bed.  He had not yet stood up.  He denies head injury, neck pain, chest pain, coughing or shortness of breath and has no arm or leg pain.  No medications given prior to arrival, paramedics report normal vital signs, they did not check a blood sugar.  Past Medical History:  Diagnosis Date  . 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 Arthritis   . CKD (chronic kidney disease), stage III   . COPD (chronic obstructive pulmonary disease) (HCC)   . Depression   . Diabetes mellitus without complication (HCC)   . Fatigue   . Hyperlipidemia   . Reflux     Patient Active Problem List   Diagnosis Date Noted  . AKI (acute kidney injury) (HCC) 07/04/2017  . CKD (chronic kidney disease), stage III 07/04/2017  . COPD (chronic obstructive pulmonary disease) (HCC) 07/04/2017  . Type 2 diabetes mellitus (HCC) 07/04/2017  . GERD (gastroesophageal reflux disease) 07/04/2017  . Hyperlipidemia 07/04/2017  . Depression 07/04/2017    History reviewed. No pertinent surgical history.     Family History  Problem Relation Age of Onset    . Stroke Mother   . Diabetes Father     Social History   Tobacco Use  . Smoking status: Current Every Day Smoker    Types: Pipe  . Smokeless tobacco: Never Used  Substance Use Topics  . Alcohol use: No  . Drug use: No    Home Medications Prior to Admission medications   Medication Sig Start Date End Date Taking? Authorizing Provider  aspirin EC 81 MG tablet Take 81 mg by mouth daily.    [provider]  atorvastatin (LIPITOR) 10 MG tablet Take 10 mg by mouth daily.    [provider]  cetirizine (ZYRTEC) 10 MG tablet Take 10 mg by mouth at bedtime.    [provider]  cholecalciferol (VITAMIN D) 1000 UNITS tablet Take 2,000 Units by mouth daily.    [provider]  citalopram (CELEXA) 20 MG tablet Take 20 mg by mouth at bedtime.    [provider]  COMBIVENT RESPIMAT 20-100 MCG/ACT AERS respimat Inhale 2 puffs into the lungs 2 (two) times daily. 02/11/13   [provider]  fluticasone (FLONASE) 50 MCG/ACT nasal spray Place 2 sprays into both nostrils daily. 03/02/13   [provider]  Insulin Glargine (TOUJEO MAX SOLOSTAR) 300 UNIT/ML SOPN Inject 76 Units into the skin at bedtime.    [provider]  insulin lispro (HUMALOG) 100 UNIT/ML injection Inject 15 Units into the skin 3 (three) times daily with meals.     [provider]  lamoTRIgine (LAMICTAL) 100 MG tablet Take 1 tablet by mouth daily. 04/08/13   [provider]  lisinopril (PRINIVIL,ZESTRIL) 2.5 MG tablet Take 2.5 mg by mouth daily.    [provider]  metFORMIN (GLUCOPHAGE) 1000 MG tablet Take 1,000 mg by mouth 2 (two) times daily with a meal.    [provider]  omeprazole (PRILOSEC) 20 MG capsule Take 1 capsule by mouth daily. 04/08/13   [provider]  ONGLYZA 5 MG TABS tablet Take 1 tablet by mouth daily. 04/08/13   [provider]  Propylene Glycol (SYSTANE BALANCE OP) Apply 1 drop to eye 3  (three) times daily.    [provider]  risperiDONE (RISPERDAL) 0.5 MG tablet Take 0.5 mg by mouth at bedtime.    [provider]  tamsulosin (FLOMAX) 0.4 MG CAPS capsule Take 1 capsule by mouth daily. 04/08/13   [provider]  traZODone (DESYREL) 100 MG tablet Take 1 tablet by mouth at bedtime. 04/08/13   [provider]    Allergies    Patient has no known allergies.  Review of Systems   Review of Systems  All other systems reviewed and are negative.   Physical Exam Updated Vital Signs BP (!) 125/54   Pulse 91   Temp 98.5 F (36.9 C)   Resp 20   Ht 1.816 m (5' 11.5")   Wt 86.2 kg   SpO2 95%   BMI 26.13 kg/m   Physical Exam Vitals and nursing note reviewed.  Constitutional:      General: He is not in acute distress.    Appearance: He is well-developed.  HENT:     Head: Normocephalic and atraumatic.     Mouth/Throat:     Pharynx: No oropharyngeal exudate.  Eyes:     General: No scleral icterus.       Right eye: No discharge.        Left eye: No discharge.     Conjunctiva/sclera: Conjunctivae normal.     Pupils: Pupils are equal, round, and reactive to light.  Neck:     Thyroid: No thyromegaly.     Vascular: No JVD.  Cardiovascular:     Rate and Rhythm: Normal rate and regular rhythm.     Heart sounds: Normal heart sounds. No murmur. No friction rub. No gallop.   Pulmonary:     Effort: Pulmonary effort is normal. No respiratory distress.     Breath sounds: Normal breath sounds. No wheezing or rales.  Abdominal:     General: Bowel sounds are normal. There is no distension.     Palpations: Abdomen is soft. There is no mass.     Tenderness: There is no abdominal tenderness.  Musculoskeletal:        General: Tenderness present. Normal range of motion.     Cervical back: Normal range of motion and neck supple.     Comments: The patient has no signs of injury to the arms or the legs however he does have mild tenderness over the  lumbar spine and the paraspinal muscles around L4-L5 and the sacrum.  There is no signs of bruising or injury to the skin.  No tenderness over the thoracic or cervical spines  Lymphadenopathy:     Cervical: No cervical adenopathy.  Skin:    General: Skin is warm and  dry.     Findings: No erythema or rash.  Neurological:     Mental Status: He is alert.     Coordination: Coordination normal.     Comments: The patient is able to straight leg raise bilaterally.  There is no weakness, normal coordination and speech, both arms are working normally.  Psychiatric:        Behavior: Behavior normal.     ED Results / Procedures / Treatments   Labs (all labs ordered are listed, but only abnormal results are displayed) Labs Reviewed  CBG MONITORING, ED - Abnormal; Notable for the following components:      Result Value   Glucose-Capillary 141 (*)    All other components within normal limits    EKG None  Radiology DG Lumbar Spine 2-3 Views  Result Date: 03/09/2019 CLINICAL DATA:  Trauma from fall.  Lower back pain EXAM: LUMBAR SPINE - 2-3 VIEW COMPARISON:  None. FINDINGS: No evidence of acute fracture or traumatic malalignment. Generalized disc narrowing with mild to moderate endplate spurring and mild dextrocurvature. Scattered atherosclerotic calcifications of the aorta. IMPRESSION: 1. No acute finding. 2. Disc degeneration with mild dextrocurvature. Electronically Signed   By: Monte Fantasia M.D.   On: 03/09/2019 06:46    Procedures Procedures (including critical care time)  Medications Ordered in ED Medications - No data to display  ED Course  I have reviewed the triage vital signs and the nursing notes.  Pertinent labs & imaging results that were available during my care of the patient were reviewed by me and considered in my medical decision making (see chart for details).    MDM Rules/Calculators/A&P                      This patient has had a minor fall slipping off the  bottom of the bed and landing on his buttock.  At this time he would definitely benefit from an x-ray to make sure he has not had a compression fracture of his back, he otherwise is neurologically intact and has no other associated injuries.  Vital signs have been reviewed and are normal.  Thankfully the imaging studies show no signs of acute fracture or dislocation, he is sitting crosslegged did, appears comfortable, speaking with staff, interactive and happy.  Vital signs are totally normal, blood sugar is totally normal, this patient appears very stable for discharge.  Final Clinical Impression(s) / ED Diagnoses Final diagnoses:  Lumbar contusion, initial encounter    Rx / DC Orders ED Discharge Orders    None       Noemi Chapel, MD 03/09/19 [phone]

## 2019-03-11 DIAGNOSIS — R278 Other lack of coordination: Secondary | ICD-10-CM | POA: Diagnosis not present

## 2019-03-26 DIAGNOSIS — E7800 Pure hypercholesterolemia, unspecified: Secondary | ICD-10-CM | POA: Diagnosis not present

## 2019-03-26 DIAGNOSIS — J449 Chronic obstructive pulmonary disease, unspecified: Secondary | ICD-10-CM | POA: Diagnosis not present

## 2019-03-26 DIAGNOSIS — E119 Type 2 diabetes mellitus without complications: Secondary | ICD-10-CM | POA: Diagnosis not present

## 2019-04-02 DIAGNOSIS — R278 Other lack of coordination: Secondary | ICD-10-CM | POA: Diagnosis not present

## 2019-04-03 DIAGNOSIS — E7800 Pure hypercholesterolemia, unspecified: Secondary | ICD-10-CM | POA: Diagnosis not present

## 2019-04-03 DIAGNOSIS — E119 Type 2 diabetes mellitus without complications: Secondary | ICD-10-CM | POA: Diagnosis not present

## 2019-04-03 DIAGNOSIS — J449 Chronic obstructive pulmonary disease, unspecified: Secondary | ICD-10-CM | POA: Diagnosis not present

## 2019-04-08 DIAGNOSIS — M6281 Muscle weakness (generalized): Secondary | ICD-10-CM | POA: Diagnosis not present

## 2019-04-08 DIAGNOSIS — R2689 Other abnormalities of gait and mobility: Secondary | ICD-10-CM | POA: Diagnosis not present

## 2019-04-12 DIAGNOSIS — R2689 Other abnormalities of gait and mobility: Secondary | ICD-10-CM | POA: Diagnosis not present

## 2019-04-12 DIAGNOSIS — M6281 Muscle weakness (generalized): Secondary | ICD-10-CM | POA: Diagnosis not present

## 2019-04-15 DIAGNOSIS — H25813 Combined forms of age-related cataract, bilateral: Secondary | ICD-10-CM | POA: Diagnosis not present

## 2019-04-15 DIAGNOSIS — Z7984 Long term (current) use of oral hypoglycemic drugs: Secondary | ICD-10-CM | POA: Diagnosis not present

## 2019-04-15 DIAGNOSIS — Z794 Long term (current) use of insulin: Secondary | ICD-10-CM | POA: Diagnosis not present

## 2019-04-15 DIAGNOSIS — E119 Type 2 diabetes mellitus without complications: Secondary | ICD-10-CM | POA: Diagnosis not present

## 2019-04-27 DIAGNOSIS — Z23 Encounter for immunization: Secondary | ICD-10-CM | POA: Diagnosis not present

## 2019-05-04 DIAGNOSIS — E7800 Pure hypercholesterolemia, unspecified: Secondary | ICD-10-CM | POA: Diagnosis not present

## 2019-05-04 DIAGNOSIS — E119 Type 2 diabetes mellitus without complications: Secondary | ICD-10-CM | POA: Diagnosis not present

## 2019-05-04 DIAGNOSIS — J449 Chronic obstructive pulmonary disease, unspecified: Secondary | ICD-10-CM | POA: Diagnosis not present

## 2019-05-13 DIAGNOSIS — M79674 Pain in right toe(s): Secondary | ICD-10-CM | POA: Diagnosis not present

## 2019-05-13 DIAGNOSIS — M79675 Pain in left toe(s): Secondary | ICD-10-CM | POA: Diagnosis not present

## 2019-05-13 DIAGNOSIS — B351 Tinea unguium: Secondary | ICD-10-CM | POA: Diagnosis not present

## 2019-05-25 DIAGNOSIS — Z23 Encounter for immunization: Secondary | ICD-10-CM | POA: Diagnosis not present

## 2019-06-15 DIAGNOSIS — E119 Type 2 diabetes mellitus without complications: Secondary | ICD-10-CM | POA: Diagnosis not present

## 2019-06-15 DIAGNOSIS — E7800 Pure hypercholesterolemia, unspecified: Secondary | ICD-10-CM | POA: Diagnosis not present

## 2019-06-15 DIAGNOSIS — J449 Chronic obstructive pulmonary disease, unspecified: Secondary | ICD-10-CM | POA: Diagnosis not present

## 2019-06-23 DIAGNOSIS — U071 COVID-19: Secondary | ICD-10-CM | POA: Diagnosis not present

## 2019-06-23 DIAGNOSIS — Z20828 Contact with and (suspected) exposure to other viral communicable diseases: Secondary | ICD-10-CM | POA: Diagnosis not present

## 2019-07-06 DIAGNOSIS — U071 COVID-19: Secondary | ICD-10-CM | POA: Diagnosis not present

## 2019-07-06 DIAGNOSIS — Z20828 Contact with and (suspected) exposure to other viral communicable diseases: Secondary | ICD-10-CM | POA: Diagnosis not present

## 2019-07-13 DIAGNOSIS — Z20828 Contact with and (suspected) exposure to other viral communicable diseases: Secondary | ICD-10-CM | POA: Diagnosis not present

## 2019-07-13 DIAGNOSIS — U071 COVID-19: Secondary | ICD-10-CM | POA: Diagnosis not present

## 2019-07-14 DIAGNOSIS — M79675 Pain in left toe(s): Secondary | ICD-10-CM | POA: Diagnosis not present

## 2019-07-14 DIAGNOSIS — M79674 Pain in right toe(s): Secondary | ICD-10-CM | POA: Diagnosis not present

## 2019-07-14 DIAGNOSIS — B351 Tinea unguium: Secondary | ICD-10-CM | POA: Diagnosis not present

## 2019-07-21 DIAGNOSIS — J449 Chronic obstructive pulmonary disease, unspecified: Secondary | ICD-10-CM | POA: Diagnosis not present

## 2019-07-21 DIAGNOSIS — E7800 Pure hypercholesterolemia, unspecified: Secondary | ICD-10-CM | POA: Diagnosis not present

## 2019-07-21 DIAGNOSIS — E119 Type 2 diabetes mellitus without complications: Secondary | ICD-10-CM | POA: Diagnosis not present

## 2019-07-23 DIAGNOSIS — J449 Chronic obstructive pulmonary disease, unspecified: Secondary | ICD-10-CM | POA: Diagnosis not present

## 2019-07-23 DIAGNOSIS — R278 Other lack of coordination: Secondary | ICD-10-CM | POA: Diagnosis not present

## 2019-07-28 DIAGNOSIS — J449 Chronic obstructive pulmonary disease, unspecified: Secondary | ICD-10-CM | POA: Diagnosis not present

## 2019-07-28 DIAGNOSIS — R278 Other lack of coordination: Secondary | ICD-10-CM | POA: Diagnosis not present

## 2019-07-30 DIAGNOSIS — J449 Chronic obstructive pulmonary disease, unspecified: Secondary | ICD-10-CM | POA: Diagnosis not present

## 2019-07-30 DIAGNOSIS — R278 Other lack of coordination: Secondary | ICD-10-CM | POA: Diagnosis not present

## 2019-08-06 DIAGNOSIS — R278 Other lack of coordination: Secondary | ICD-10-CM | POA: Diagnosis not present

## 2019-08-06 DIAGNOSIS — J449 Chronic obstructive pulmonary disease, unspecified: Secondary | ICD-10-CM | POA: Diagnosis not present

## 2019-08-07 DIAGNOSIS — J449 Chronic obstructive pulmonary disease, unspecified: Secondary | ICD-10-CM | POA: Diagnosis not present

## 2019-08-07 DIAGNOSIS — R278 Other lack of coordination: Secondary | ICD-10-CM | POA: Diagnosis not present

## 2019-08-11 DIAGNOSIS — J449 Chronic obstructive pulmonary disease, unspecified: Secondary | ICD-10-CM | POA: Diagnosis not present

## 2019-08-11 DIAGNOSIS — R278 Other lack of coordination: Secondary | ICD-10-CM | POA: Diagnosis not present

## 2019-08-13 DIAGNOSIS — J449 Chronic obstructive pulmonary disease, unspecified: Secondary | ICD-10-CM | POA: Diagnosis not present

## 2019-08-13 DIAGNOSIS — R278 Other lack of coordination: Secondary | ICD-10-CM | POA: Diagnosis not present

## 2019-08-18 DIAGNOSIS — R278 Other lack of coordination: Secondary | ICD-10-CM | POA: Diagnosis not present

## 2019-08-18 DIAGNOSIS — J449 Chronic obstructive pulmonary disease, unspecified: Secondary | ICD-10-CM | POA: Diagnosis not present

## 2019-08-20 DIAGNOSIS — R278 Other lack of coordination: Secondary | ICD-10-CM | POA: Diagnosis not present

## 2019-08-20 DIAGNOSIS — J449 Chronic obstructive pulmonary disease, unspecified: Secondary | ICD-10-CM | POA: Diagnosis not present

## 2019-08-23 DIAGNOSIS — J449 Chronic obstructive pulmonary disease, unspecified: Secondary | ICD-10-CM | POA: Diagnosis not present

## 2019-08-23 DIAGNOSIS — E7800 Pure hypercholesterolemia, unspecified: Secondary | ICD-10-CM | POA: Diagnosis not present

## 2019-08-23 DIAGNOSIS — E119 Type 2 diabetes mellitus without complications: Secondary | ICD-10-CM | POA: Diagnosis not present

## 2019-08-25 DIAGNOSIS — J449 Chronic obstructive pulmonary disease, unspecified: Secondary | ICD-10-CM | POA: Diagnosis not present

## 2019-08-25 DIAGNOSIS — R278 Other lack of coordination: Secondary | ICD-10-CM | POA: Diagnosis not present

## 2019-08-27 DIAGNOSIS — R278 Other lack of coordination: Secondary | ICD-10-CM | POA: Diagnosis not present

## 2019-08-27 DIAGNOSIS — J449 Chronic obstructive pulmonary disease, unspecified: Secondary | ICD-10-CM | POA: Diagnosis not present

## 2019-09-23 DIAGNOSIS — E119 Type 2 diabetes mellitus without complications: Secondary | ICD-10-CM | POA: Diagnosis not present

## 2019-09-23 DIAGNOSIS — J449 Chronic obstructive pulmonary disease, unspecified: Secondary | ICD-10-CM | POA: Diagnosis not present

## 2019-09-23 DIAGNOSIS — E7800 Pure hypercholesterolemia, unspecified: Secondary | ICD-10-CM | POA: Diagnosis not present

## 2019-10-23 DIAGNOSIS — E7800 Pure hypercholesterolemia, unspecified: Secondary | ICD-10-CM | POA: Diagnosis not present

## 2019-10-23 DIAGNOSIS — J449 Chronic obstructive pulmonary disease, unspecified: Secondary | ICD-10-CM | POA: Diagnosis not present

## 2019-10-23 DIAGNOSIS — E119 Type 2 diabetes mellitus without complications: Secondary | ICD-10-CM | POA: Diagnosis not present

## 2019-11-04 DIAGNOSIS — J449 Chronic obstructive pulmonary disease, unspecified: Secondary | ICD-10-CM | POA: Diagnosis not present

## 2019-11-04 DIAGNOSIS — E119 Type 2 diabetes mellitus without complications: Secondary | ICD-10-CM | POA: Diagnosis not present

## 2019-11-04 DIAGNOSIS — E7800 Pure hypercholesterolemia, unspecified: Secondary | ICD-10-CM | POA: Diagnosis not present

## 2019-11-10 DIAGNOSIS — Z20828 Contact with and (suspected) exposure to other viral communicable diseases: Secondary | ICD-10-CM | POA: Diagnosis not present

## 2019-11-10 DIAGNOSIS — U071 COVID-19: Secondary | ICD-10-CM | POA: Diagnosis not present

## 2019-11-17 DIAGNOSIS — Z20828 Contact with and (suspected) exposure to other viral communicable diseases: Secondary | ICD-10-CM | POA: Diagnosis not present

## 2019-11-17 DIAGNOSIS — U071 COVID-19: Secondary | ICD-10-CM | POA: Diagnosis not present

## 2019-11-18 DIAGNOSIS — Z20828 Contact with and (suspected) exposure to other viral communicable diseases: Secondary | ICD-10-CM | POA: Diagnosis not present

## 2019-11-18 DIAGNOSIS — U071 COVID-19: Secondary | ICD-10-CM | POA: Diagnosis not present

## 2019-11-19 DIAGNOSIS — Z20828 Contact with and (suspected) exposure to other viral communicable diseases: Secondary | ICD-10-CM | POA: Diagnosis not present

## 2019-11-19 DIAGNOSIS — U071 COVID-19: Secondary | ICD-10-CM | POA: Diagnosis not present

## 2019-11-20 DIAGNOSIS — M79675 Pain in left toe(s): Secondary | ICD-10-CM | POA: Diagnosis not present

## 2019-11-20 DIAGNOSIS — M79674 Pain in right toe(s): Secondary | ICD-10-CM | POA: Diagnosis not present

## 2019-11-20 DIAGNOSIS — B351 Tinea unguium: Secondary | ICD-10-CM | POA: Diagnosis not present

## 2019-11-24 DIAGNOSIS — Z20828 Contact with and (suspected) exposure to other viral communicable diseases: Secondary | ICD-10-CM | POA: Diagnosis not present

## 2019-11-24 DIAGNOSIS — U071 COVID-19: Secondary | ICD-10-CM | POA: Diagnosis not present

## 2019-11-26 DIAGNOSIS — Z20828 Contact with and (suspected) exposure to other viral communicable diseases: Secondary | ICD-10-CM | POA: Diagnosis not present

## 2019-11-26 DIAGNOSIS — U071 COVID-19: Secondary | ICD-10-CM | POA: Diagnosis not present

## 2019-12-01 DIAGNOSIS — U071 COVID-19: Secondary | ICD-10-CM | POA: Diagnosis not present

## 2019-12-01 DIAGNOSIS — Z20828 Contact with and (suspected) exposure to other viral communicable diseases: Secondary | ICD-10-CM | POA: Diagnosis not present

## 2019-12-02 DIAGNOSIS — U071 COVID-19: Secondary | ICD-10-CM | POA: Diagnosis not present

## 2019-12-02 DIAGNOSIS — Z20828 Contact with and (suspected) exposure to other viral communicable diseases: Secondary | ICD-10-CM | POA: Diagnosis not present

## 2019-12-03 DIAGNOSIS — Z20828 Contact with and (suspected) exposure to other viral communicable diseases: Secondary | ICD-10-CM | POA: Diagnosis not present

## 2019-12-03 DIAGNOSIS — U071 COVID-19: Secondary | ICD-10-CM | POA: Diagnosis not present

## 2019-12-07 DIAGNOSIS — Z20828 Contact with and (suspected) exposure to other viral communicable diseases: Secondary | ICD-10-CM | POA: Diagnosis not present

## 2019-12-07 DIAGNOSIS — Z7189 Other specified counseling: Secondary | ICD-10-CM | POA: Diagnosis not present

## 2019-12-07 DIAGNOSIS — Z1331 Encounter for screening for depression: Secondary | ICD-10-CM | POA: Diagnosis not present

## 2019-12-07 DIAGNOSIS — Z125 Encounter for screening for malignant neoplasm of prostate: Secondary | ICD-10-CM | POA: Diagnosis not present

## 2019-12-07 DIAGNOSIS — E1165 Type 2 diabetes mellitus with hyperglycemia: Secondary | ICD-10-CM | POA: Diagnosis not present

## 2019-12-07 DIAGNOSIS — E7800 Pure hypercholesterolemia, unspecified: Secondary | ICD-10-CM | POA: Diagnosis not present

## 2019-12-07 DIAGNOSIS — J449 Chronic obstructive pulmonary disease, unspecified: Secondary | ICD-10-CM | POA: Diagnosis not present

## 2019-12-07 DIAGNOSIS — U071 COVID-19: Secondary | ICD-10-CM | POA: Diagnosis not present

## 2019-12-07 DIAGNOSIS — Z79899 Other long term (current) drug therapy: Secondary | ICD-10-CM | POA: Diagnosis not present

## 2019-12-07 DIAGNOSIS — Z299 Encounter for prophylactic measures, unspecified: Secondary | ICD-10-CM | POA: Diagnosis not present

## 2019-12-07 DIAGNOSIS — Z0000 Encounter for general adult medical examination without abnormal findings: Secondary | ICD-10-CM | POA: Diagnosis not present

## 2019-12-07 DIAGNOSIS — Z1339 Encounter for screening examination for other mental health and behavioral disorders: Secondary | ICD-10-CM | POA: Diagnosis not present

## 2019-12-07 DIAGNOSIS — Z6829 Body mass index (BMI) 29.0-29.9, adult: Secondary | ICD-10-CM | POA: Diagnosis not present

## 2019-12-07 DIAGNOSIS — R5383 Other fatigue: Secondary | ICD-10-CM | POA: Diagnosis not present

## 2019-12-10 DIAGNOSIS — U071 COVID-19: Secondary | ICD-10-CM | POA: Diagnosis not present

## 2019-12-10 DIAGNOSIS — Z20828 Contact with and (suspected) exposure to other viral communicable diseases: Secondary | ICD-10-CM | POA: Diagnosis not present

## 2019-12-15 DIAGNOSIS — Z20828 Contact with and (suspected) exposure to other viral communicable diseases: Secondary | ICD-10-CM | POA: Diagnosis not present

## 2019-12-15 DIAGNOSIS — R2681 Unsteadiness on feet: Secondary | ICD-10-CM | POA: Diagnosis not present

## 2019-12-15 DIAGNOSIS — M6281 Muscle weakness (generalized): Secondary | ICD-10-CM | POA: Diagnosis not present

## 2019-12-15 DIAGNOSIS — U071 COVID-19: Secondary | ICD-10-CM | POA: Diagnosis not present

## 2019-12-17 DIAGNOSIS — R2681 Unsteadiness on feet: Secondary | ICD-10-CM | POA: Diagnosis not present

## 2019-12-17 DIAGNOSIS — Z20828 Contact with and (suspected) exposure to other viral communicable diseases: Secondary | ICD-10-CM | POA: Diagnosis not present

## 2019-12-17 DIAGNOSIS — M6281 Muscle weakness (generalized): Secondary | ICD-10-CM | POA: Diagnosis not present

## 2019-12-17 DIAGNOSIS — U071 COVID-19: Secondary | ICD-10-CM | POA: Diagnosis not present

## 2019-12-20 DIAGNOSIS — M6281 Muscle weakness (generalized): Secondary | ICD-10-CM | POA: Diagnosis not present

## 2019-12-20 DIAGNOSIS — R2681 Unsteadiness on feet: Secondary | ICD-10-CM | POA: Diagnosis not present

## 2019-12-22 DIAGNOSIS — R2681 Unsteadiness on feet: Secondary | ICD-10-CM | POA: Diagnosis not present

## 2019-12-22 DIAGNOSIS — U071 COVID-19: Secondary | ICD-10-CM | POA: Diagnosis not present

## 2019-12-22 DIAGNOSIS — M6281 Muscle weakness (generalized): Secondary | ICD-10-CM | POA: Diagnosis not present

## 2019-12-22 DIAGNOSIS — Z20828 Contact with and (suspected) exposure to other viral communicable diseases: Secondary | ICD-10-CM | POA: Diagnosis not present

## 2019-12-24 DIAGNOSIS — E7800 Pure hypercholesterolemia, unspecified: Secondary | ICD-10-CM | POA: Diagnosis not present

## 2019-12-24 DIAGNOSIS — Z20828 Contact with and (suspected) exposure to other viral communicable diseases: Secondary | ICD-10-CM | POA: Diagnosis not present

## 2019-12-24 DIAGNOSIS — U071 COVID-19: Secondary | ICD-10-CM | POA: Diagnosis not present

## 2019-12-24 DIAGNOSIS — E119 Type 2 diabetes mellitus without complications: Secondary | ICD-10-CM | POA: Diagnosis not present

## 2019-12-24 DIAGNOSIS — J449 Chronic obstructive pulmonary disease, unspecified: Secondary | ICD-10-CM | POA: Diagnosis not present

## 2019-12-29 DIAGNOSIS — R2681 Unsteadiness on feet: Secondary | ICD-10-CM | POA: Diagnosis not present

## 2019-12-29 DIAGNOSIS — Z20828 Contact with and (suspected) exposure to other viral communicable diseases: Secondary | ICD-10-CM | POA: Diagnosis not present

## 2019-12-29 DIAGNOSIS — M6281 Muscle weakness (generalized): Secondary | ICD-10-CM | POA: Diagnosis not present

## 2019-12-29 DIAGNOSIS — U071 COVID-19: Secondary | ICD-10-CM | POA: Diagnosis not present

## 2019-12-31 DIAGNOSIS — R2681 Unsteadiness on feet: Secondary | ICD-10-CM | POA: Diagnosis not present

## 2019-12-31 DIAGNOSIS — M6281 Muscle weakness (generalized): Secondary | ICD-10-CM | POA: Diagnosis not present

## 2019-12-31 DIAGNOSIS — U071 COVID-19: Secondary | ICD-10-CM | POA: Diagnosis not present

## 2019-12-31 DIAGNOSIS — Z20828 Contact with and (suspected) exposure to other viral communicable diseases: Secondary | ICD-10-CM | POA: Diagnosis not present

## 2020-01-05 DIAGNOSIS — Z20828 Contact with and (suspected) exposure to other viral communicable diseases: Secondary | ICD-10-CM | POA: Diagnosis not present

## 2020-01-05 DIAGNOSIS — U071 COVID-19: Secondary | ICD-10-CM | POA: Diagnosis not present

## 2020-01-05 DIAGNOSIS — R2681 Unsteadiness on feet: Secondary | ICD-10-CM | POA: Diagnosis not present

## 2020-01-05 DIAGNOSIS — M6281 Muscle weakness (generalized): Secondary | ICD-10-CM | POA: Diagnosis not present

## 2020-01-07 DIAGNOSIS — Z20828 Contact with and (suspected) exposure to other viral communicable diseases: Secondary | ICD-10-CM | POA: Diagnosis not present

## 2020-01-07 DIAGNOSIS — U071 COVID-19: Secondary | ICD-10-CM | POA: Diagnosis not present

## 2020-01-07 DIAGNOSIS — M6281 Muscle weakness (generalized): Secondary | ICD-10-CM | POA: Diagnosis not present

## 2020-01-07 DIAGNOSIS — R2681 Unsteadiness on feet: Secondary | ICD-10-CM | POA: Diagnosis not present

## 2020-01-12 DIAGNOSIS — U071 COVID-19: Secondary | ICD-10-CM | POA: Diagnosis not present

## 2020-01-12 DIAGNOSIS — Z20828 Contact with and (suspected) exposure to other viral communicable diseases: Secondary | ICD-10-CM | POA: Diagnosis not present

## 2020-01-14 DIAGNOSIS — U071 COVID-19: Secondary | ICD-10-CM | POA: Diagnosis not present

## 2020-01-14 DIAGNOSIS — Z23 Encounter for immunization: Secondary | ICD-10-CM | POA: Diagnosis not present

## 2020-01-14 DIAGNOSIS — Z20828 Contact with and (suspected) exposure to other viral communicable diseases: Secondary | ICD-10-CM | POA: Diagnosis not present

## 2020-01-19 DIAGNOSIS — U071 COVID-19: Secondary | ICD-10-CM | POA: Diagnosis not present

## 2020-01-19 DIAGNOSIS — Z20828 Contact with and (suspected) exposure to other viral communicable diseases: Secondary | ICD-10-CM | POA: Diagnosis not present

## 2020-01-19 DIAGNOSIS — R2681 Unsteadiness on feet: Secondary | ICD-10-CM | POA: Diagnosis not present

## 2020-01-19 DIAGNOSIS — M6281 Muscle weakness (generalized): Secondary | ICD-10-CM | POA: Diagnosis not present

## 2020-01-21 DIAGNOSIS — U071 COVID-19: Secondary | ICD-10-CM | POA: Diagnosis not present

## 2020-01-21 DIAGNOSIS — M6281 Muscle weakness (generalized): Secondary | ICD-10-CM | POA: Diagnosis not present

## 2020-01-21 DIAGNOSIS — Z20828 Contact with and (suspected) exposure to other viral communicable diseases: Secondary | ICD-10-CM | POA: Diagnosis not present

## 2020-01-21 DIAGNOSIS — R2681 Unsteadiness on feet: Secondary | ICD-10-CM | POA: Diagnosis not present

## 2020-01-22 DIAGNOSIS — E119 Type 2 diabetes mellitus without complications: Secondary | ICD-10-CM | POA: Diagnosis not present

## 2020-01-22 DIAGNOSIS — E7800 Pure hypercholesterolemia, unspecified: Secondary | ICD-10-CM | POA: Diagnosis not present

## 2020-01-22 DIAGNOSIS — J449 Chronic obstructive pulmonary disease, unspecified: Secondary | ICD-10-CM | POA: Diagnosis not present

## 2020-01-22 DIAGNOSIS — B351 Tinea unguium: Secondary | ICD-10-CM | POA: Diagnosis not present

## 2020-01-22 DIAGNOSIS — M79675 Pain in left toe(s): Secondary | ICD-10-CM | POA: Diagnosis not present

## 2020-01-22 DIAGNOSIS — M79674 Pain in right toe(s): Secondary | ICD-10-CM | POA: Diagnosis not present

## 2020-01-26 DIAGNOSIS — U071 COVID-19: Secondary | ICD-10-CM | POA: Diagnosis not present

## 2020-01-26 DIAGNOSIS — Z20828 Contact with and (suspected) exposure to other viral communicable diseases: Secondary | ICD-10-CM | POA: Diagnosis not present

## 2020-01-26 DIAGNOSIS — R2681 Unsteadiness on feet: Secondary | ICD-10-CM | POA: Diagnosis not present

## 2020-01-26 DIAGNOSIS — M6281 Muscle weakness (generalized): Secondary | ICD-10-CM | POA: Diagnosis not present

## 2020-01-28 DIAGNOSIS — U071 COVID-19: Secondary | ICD-10-CM | POA: Diagnosis not present

## 2020-01-28 DIAGNOSIS — Z20828 Contact with and (suspected) exposure to other viral communicable diseases: Secondary | ICD-10-CM | POA: Diagnosis not present

## 2020-02-02 DIAGNOSIS — U071 COVID-19: Secondary | ICD-10-CM | POA: Diagnosis not present

## 2020-02-02 DIAGNOSIS — Z20828 Contact with and (suspected) exposure to other viral communicable diseases: Secondary | ICD-10-CM | POA: Diagnosis not present

## 2020-02-04 DIAGNOSIS — U071 COVID-19: Secondary | ICD-10-CM | POA: Diagnosis not present

## 2020-02-04 DIAGNOSIS — Z20828 Contact with and (suspected) exposure to other viral communicable diseases: Secondary | ICD-10-CM | POA: Diagnosis not present

## 2020-02-09 DIAGNOSIS — U071 COVID-19: Secondary | ICD-10-CM | POA: Diagnosis not present

## 2020-02-09 DIAGNOSIS — Z20828 Contact with and (suspected) exposure to other viral communicable diseases: Secondary | ICD-10-CM | POA: Diagnosis not present

## 2020-02-11 DIAGNOSIS — U071 COVID-19: Secondary | ICD-10-CM | POA: Diagnosis not present

## 2020-02-11 DIAGNOSIS — Z20828 Contact with and (suspected) exposure to other viral communicable diseases: Secondary | ICD-10-CM | POA: Diagnosis not present

## 2020-02-16 DIAGNOSIS — Z20828 Contact with and (suspected) exposure to other viral communicable diseases: Secondary | ICD-10-CM | POA: Diagnosis not present

## 2020-02-16 DIAGNOSIS — U071 COVID-19: Secondary | ICD-10-CM | POA: Diagnosis not present

## 2020-02-23 DIAGNOSIS — Z20828 Contact with and (suspected) exposure to other viral communicable diseases: Secondary | ICD-10-CM | POA: Diagnosis not present

## 2020-02-23 DIAGNOSIS — U071 COVID-19: Secondary | ICD-10-CM | POA: Diagnosis not present

## 2020-02-23 DIAGNOSIS — J449 Chronic obstructive pulmonary disease, unspecified: Secondary | ICD-10-CM | POA: Diagnosis not present

## 2020-02-23 DIAGNOSIS — E7800 Pure hypercholesterolemia, unspecified: Secondary | ICD-10-CM | POA: Diagnosis not present

## 2020-02-23 DIAGNOSIS — E119 Type 2 diabetes mellitus without complications: Secondary | ICD-10-CM | POA: Diagnosis not present

## 2020-02-25 DIAGNOSIS — Z20828 Contact with and (suspected) exposure to other viral communicable diseases: Secondary | ICD-10-CM | POA: Diagnosis not present

## 2020-02-25 DIAGNOSIS — U071 COVID-19: Secondary | ICD-10-CM | POA: Diagnosis not present

## 2020-03-01 DIAGNOSIS — Z20828 Contact with and (suspected) exposure to other viral communicable diseases: Secondary | ICD-10-CM | POA: Diagnosis not present

## 2020-03-01 DIAGNOSIS — U071 COVID-19: Secondary | ICD-10-CM | POA: Diagnosis not present

## 2020-03-03 DIAGNOSIS — Z20828 Contact with and (suspected) exposure to other viral communicable diseases: Secondary | ICD-10-CM | POA: Diagnosis not present

## 2020-03-03 DIAGNOSIS — U071 COVID-19: Secondary | ICD-10-CM | POA: Diagnosis not present

## 2020-03-08 DIAGNOSIS — Z20828 Contact with and (suspected) exposure to other viral communicable diseases: Secondary | ICD-10-CM | POA: Diagnosis not present

## 2020-03-08 DIAGNOSIS — U071 COVID-19: Secondary | ICD-10-CM | POA: Diagnosis not present

## 2020-03-10 DIAGNOSIS — Z20828 Contact with and (suspected) exposure to other viral communicable diseases: Secondary | ICD-10-CM | POA: Diagnosis not present

## 2020-03-10 DIAGNOSIS — U071 COVID-19: Secondary | ICD-10-CM | POA: Diagnosis not present

## 2020-03-14 DIAGNOSIS — N1832 Chronic kidney disease, stage 3b: Secondary | ICD-10-CM | POA: Diagnosis not present

## 2020-03-14 DIAGNOSIS — E1165 Type 2 diabetes mellitus with hyperglycemia: Secondary | ICD-10-CM | POA: Diagnosis not present

## 2020-03-14 DIAGNOSIS — Z299 Encounter for prophylactic measures, unspecified: Secondary | ICD-10-CM | POA: Diagnosis not present

## 2020-03-14 DIAGNOSIS — J449 Chronic obstructive pulmonary disease, unspecified: Secondary | ICD-10-CM | POA: Diagnosis not present

## 2020-03-14 DIAGNOSIS — E1121 Type 2 diabetes mellitus with diabetic nephropathy: Secondary | ICD-10-CM | POA: Diagnosis not present

## 2020-03-15 DIAGNOSIS — Z20828 Contact with and (suspected) exposure to other viral communicable diseases: Secondary | ICD-10-CM | POA: Diagnosis not present

## 2020-03-15 DIAGNOSIS — U071 COVID-19: Secondary | ICD-10-CM | POA: Diagnosis not present

## 2020-03-17 DIAGNOSIS — Z20828 Contact with and (suspected) exposure to other viral communicable diseases: Secondary | ICD-10-CM | POA: Diagnosis not present

## 2020-03-17 DIAGNOSIS — U071 COVID-19: Secondary | ICD-10-CM | POA: Diagnosis not present

## 2020-03-22 DIAGNOSIS — U071 COVID-19: Secondary | ICD-10-CM | POA: Diagnosis not present

## 2020-03-22 DIAGNOSIS — Z20828 Contact with and (suspected) exposure to other viral communicable diseases: Secondary | ICD-10-CM | POA: Diagnosis not present

## 2020-03-24 DIAGNOSIS — J449 Chronic obstructive pulmonary disease, unspecified: Secondary | ICD-10-CM | POA: Diagnosis not present

## 2020-03-24 DIAGNOSIS — Z20828 Contact with and (suspected) exposure to other viral communicable diseases: Secondary | ICD-10-CM | POA: Diagnosis not present

## 2020-03-24 DIAGNOSIS — E119 Type 2 diabetes mellitus without complications: Secondary | ICD-10-CM | POA: Diagnosis not present

## 2020-03-24 DIAGNOSIS — E7800 Pure hypercholesterolemia, unspecified: Secondary | ICD-10-CM | POA: Diagnosis not present

## 2020-03-24 DIAGNOSIS — U071 COVID-19: Secondary | ICD-10-CM | POA: Diagnosis not present

## 2020-03-29 DIAGNOSIS — U071 COVID-19: Secondary | ICD-10-CM | POA: Diagnosis not present

## 2020-03-29 DIAGNOSIS — Z20828 Contact with and (suspected) exposure to other viral communicable diseases: Secondary | ICD-10-CM | POA: Diagnosis not present

## 2020-03-31 DIAGNOSIS — Z20828 Contact with and (suspected) exposure to other viral communicable diseases: Secondary | ICD-10-CM | POA: Diagnosis not present

## 2020-03-31 DIAGNOSIS — U071 COVID-19: Secondary | ICD-10-CM | POA: Diagnosis not present

## 2020-04-01 DIAGNOSIS — E1165 Type 2 diabetes mellitus with hyperglycemia: Secondary | ICD-10-CM | POA: Diagnosis not present

## 2020-04-01 DIAGNOSIS — J449 Chronic obstructive pulmonary disease, unspecified: Secondary | ICD-10-CM | POA: Diagnosis not present

## 2020-04-01 DIAGNOSIS — J209 Acute bronchitis, unspecified: Secondary | ICD-10-CM | POA: Diagnosis not present

## 2020-04-01 DIAGNOSIS — Z299 Encounter for prophylactic measures, unspecified: Secondary | ICD-10-CM | POA: Diagnosis not present

## 2020-04-01 DIAGNOSIS — R059 Cough, unspecified: Secondary | ICD-10-CM | POA: Diagnosis not present

## 2020-04-05 DIAGNOSIS — U071 COVID-19: Secondary | ICD-10-CM | POA: Diagnosis not present

## 2020-04-05 DIAGNOSIS — Z20828 Contact with and (suspected) exposure to other viral communicable diseases: Secondary | ICD-10-CM | POA: Diagnosis not present

## 2020-04-07 DIAGNOSIS — U071 COVID-19: Secondary | ICD-10-CM | POA: Diagnosis not present

## 2020-04-07 DIAGNOSIS — Z20828 Contact with and (suspected) exposure to other viral communicable diseases: Secondary | ICD-10-CM | POA: Diagnosis not present

## 2020-04-08 DIAGNOSIS — M79675 Pain in left toe(s): Secondary | ICD-10-CM | POA: Diagnosis not present

## 2020-04-08 DIAGNOSIS — B351 Tinea unguium: Secondary | ICD-10-CM | POA: Diagnosis not present

## 2020-04-08 DIAGNOSIS — M79674 Pain in right toe(s): Secondary | ICD-10-CM | POA: Diagnosis not present

## 2020-04-12 DIAGNOSIS — Z20828 Contact with and (suspected) exposure to other viral communicable diseases: Secondary | ICD-10-CM | POA: Diagnosis not present

## 2020-04-12 DIAGNOSIS — U071 COVID-19: Secondary | ICD-10-CM | POA: Diagnosis not present

## 2020-04-14 DIAGNOSIS — Z20828 Contact with and (suspected) exposure to other viral communicable diseases: Secondary | ICD-10-CM | POA: Diagnosis not present

## 2020-04-14 DIAGNOSIS — U071 COVID-19: Secondary | ICD-10-CM | POA: Diagnosis not present

## 2020-04-15 DIAGNOSIS — U071 COVID-19: Secondary | ICD-10-CM | POA: Diagnosis not present

## 2020-04-19 DIAGNOSIS — Z20828 Contact with and (suspected) exposure to other viral communicable diseases: Secondary | ICD-10-CM | POA: Diagnosis not present

## 2020-04-19 DIAGNOSIS — U071 COVID-19: Secondary | ICD-10-CM | POA: Diagnosis not present

## 2020-04-20 DIAGNOSIS — E119 Type 2 diabetes mellitus without complications: Secondary | ICD-10-CM | POA: Diagnosis not present

## 2020-04-20 DIAGNOSIS — Z7984 Long term (current) use of oral hypoglycemic drugs: Secondary | ICD-10-CM | POA: Diagnosis not present

## 2020-04-20 DIAGNOSIS — H25813 Combined forms of age-related cataract, bilateral: Secondary | ICD-10-CM | POA: Diagnosis not present

## 2020-04-20 DIAGNOSIS — Z794 Long term (current) use of insulin: Secondary | ICD-10-CM | POA: Diagnosis not present

## 2020-04-21 DIAGNOSIS — Z20828 Contact with and (suspected) exposure to other viral communicable diseases: Secondary | ICD-10-CM | POA: Diagnosis not present

## 2020-04-21 DIAGNOSIS — U071 COVID-19: Secondary | ICD-10-CM | POA: Diagnosis not present

## 2020-04-26 DIAGNOSIS — Z20828 Contact with and (suspected) exposure to other viral communicable diseases: Secondary | ICD-10-CM | POA: Diagnosis not present

## 2020-04-26 DIAGNOSIS — U071 COVID-19: Secondary | ICD-10-CM | POA: Diagnosis not present

## 2020-04-28 DIAGNOSIS — U071 COVID-19: Secondary | ICD-10-CM | POA: Diagnosis not present

## 2020-04-28 DIAGNOSIS — Z20828 Contact with and (suspected) exposure to other viral communicable diseases: Secondary | ICD-10-CM | POA: Diagnosis not present

## 2020-05-03 DIAGNOSIS — U071 COVID-19: Secondary | ICD-10-CM | POA: Diagnosis not present

## 2020-05-03 DIAGNOSIS — Z20828 Contact with and (suspected) exposure to other viral communicable diseases: Secondary | ICD-10-CM | POA: Diagnosis not present

## 2020-05-05 DIAGNOSIS — U071 COVID-19: Secondary | ICD-10-CM | POA: Diagnosis not present

## 2020-05-05 DIAGNOSIS — Z20828 Contact with and (suspected) exposure to other viral communicable diseases: Secondary | ICD-10-CM | POA: Diagnosis not present

## 2020-05-10 DIAGNOSIS — U071 COVID-19: Secondary | ICD-10-CM | POA: Diagnosis not present

## 2020-05-10 DIAGNOSIS — Z20828 Contact with and (suspected) exposure to other viral communicable diseases: Secondary | ICD-10-CM | POA: Diagnosis not present

## 2020-05-12 DIAGNOSIS — Z20828 Contact with and (suspected) exposure to other viral communicable diseases: Secondary | ICD-10-CM | POA: Diagnosis not present

## 2020-05-12 DIAGNOSIS — U071 COVID-19: Secondary | ICD-10-CM | POA: Diagnosis not present

## 2020-05-17 DIAGNOSIS — U071 COVID-19: Secondary | ICD-10-CM | POA: Diagnosis not present

## 2020-05-17 DIAGNOSIS — Z20828 Contact with and (suspected) exposure to other viral communicable diseases: Secondary | ICD-10-CM | POA: Diagnosis not present

## 2020-05-19 DIAGNOSIS — U071 COVID-19: Secondary | ICD-10-CM | POA: Diagnosis not present

## 2020-05-19 DIAGNOSIS — Z20828 Contact with and (suspected) exposure to other viral communicable diseases: Secondary | ICD-10-CM | POA: Diagnosis not present

## 2020-05-23 DIAGNOSIS — K219 Gastro-esophageal reflux disease without esophagitis: Secondary | ICD-10-CM | POA: Diagnosis not present

## 2020-05-23 DIAGNOSIS — F32A Depression, unspecified: Secondary | ICD-10-CM | POA: Diagnosis not present

## 2020-05-24 DIAGNOSIS — U071 COVID-19: Secondary | ICD-10-CM | POA: Diagnosis not present

## 2020-05-24 DIAGNOSIS — Z20828 Contact with and (suspected) exposure to other viral communicable diseases: Secondary | ICD-10-CM | POA: Diagnosis not present

## 2020-05-26 DIAGNOSIS — Z20828 Contact with and (suspected) exposure to other viral communicable diseases: Secondary | ICD-10-CM | POA: Diagnosis not present

## 2020-05-26 DIAGNOSIS — U071 COVID-19: Secondary | ICD-10-CM | POA: Diagnosis not present

## 2020-05-31 DIAGNOSIS — U071 COVID-19: Secondary | ICD-10-CM | POA: Diagnosis not present

## 2020-05-31 DIAGNOSIS — Z20828 Contact with and (suspected) exposure to other viral communicable diseases: Secondary | ICD-10-CM | POA: Diagnosis not present

## 2020-06-07 DIAGNOSIS — Z20828 Contact with and (suspected) exposure to other viral communicable diseases: Secondary | ICD-10-CM | POA: Diagnosis not present

## 2020-06-07 DIAGNOSIS — U071 COVID-19: Secondary | ICD-10-CM | POA: Diagnosis not present

## 2020-06-14 DIAGNOSIS — U071 COVID-19: Secondary | ICD-10-CM | POA: Diagnosis not present

## 2020-06-14 DIAGNOSIS — Z20828 Contact with and (suspected) exposure to other viral communicable diseases: Secondary | ICD-10-CM | POA: Diagnosis not present

## 2020-06-22 DIAGNOSIS — J449 Chronic obstructive pulmonary disease, unspecified: Secondary | ICD-10-CM | POA: Diagnosis not present

## 2020-06-22 DIAGNOSIS — F32A Depression, unspecified: Secondary | ICD-10-CM | POA: Diagnosis not present

## 2020-06-23 DIAGNOSIS — U071 COVID-19: Secondary | ICD-10-CM | POA: Diagnosis not present

## 2020-06-23 DIAGNOSIS — Z20828 Contact with and (suspected) exposure to other viral communicable diseases: Secondary | ICD-10-CM | POA: Diagnosis not present

## 2020-06-28 DIAGNOSIS — U071 COVID-19: Secondary | ICD-10-CM | POA: Diagnosis not present

## 2020-06-28 DIAGNOSIS — M79674 Pain in right toe(s): Secondary | ICD-10-CM | POA: Diagnosis not present

## 2020-06-28 DIAGNOSIS — F339 Major depressive disorder, recurrent, unspecified: Secondary | ICD-10-CM | POA: Diagnosis not present

## 2020-06-28 DIAGNOSIS — Z20828 Contact with and (suspected) exposure to other viral communicable diseases: Secondary | ICD-10-CM | POA: Diagnosis not present

## 2020-06-28 DIAGNOSIS — Z299 Encounter for prophylactic measures, unspecified: Secondary | ICD-10-CM | POA: Diagnosis not present

## 2020-06-28 DIAGNOSIS — M79675 Pain in left toe(s): Secondary | ICD-10-CM | POA: Diagnosis not present

## 2020-06-28 DIAGNOSIS — E7800 Pure hypercholesterolemia, unspecified: Secondary | ICD-10-CM | POA: Diagnosis not present

## 2020-06-28 DIAGNOSIS — E1165 Type 2 diabetes mellitus with hyperglycemia: Secondary | ICD-10-CM | POA: Diagnosis not present

## 2020-06-28 DIAGNOSIS — B351 Tinea unguium: Secondary | ICD-10-CM | POA: Diagnosis not present

## 2020-06-28 DIAGNOSIS — J449 Chronic obstructive pulmonary disease, unspecified: Secondary | ICD-10-CM | POA: Diagnosis not present

## 2020-07-05 DIAGNOSIS — Z20828 Contact with and (suspected) exposure to other viral communicable diseases: Secondary | ICD-10-CM | POA: Diagnosis not present

## 2020-07-05 DIAGNOSIS — U071 COVID-19: Secondary | ICD-10-CM | POA: Diagnosis not present

## 2020-07-12 DIAGNOSIS — Z20828 Contact with and (suspected) exposure to other viral communicable diseases: Secondary | ICD-10-CM | POA: Diagnosis not present

## 2020-07-12 DIAGNOSIS — U071 COVID-19: Secondary | ICD-10-CM | POA: Diagnosis not present

## 2020-07-19 DIAGNOSIS — U071 COVID-19: Secondary | ICD-10-CM | POA: Diagnosis not present

## 2020-07-19 DIAGNOSIS — Z20828 Contact with and (suspected) exposure to other viral communicable diseases: Secondary | ICD-10-CM | POA: Diagnosis not present

## 2020-07-23 DIAGNOSIS — F32A Depression, unspecified: Secondary | ICD-10-CM | POA: Diagnosis not present

## 2020-07-23 DIAGNOSIS — K219 Gastro-esophageal reflux disease without esophagitis: Secondary | ICD-10-CM | POA: Diagnosis not present

## 2020-07-26 DIAGNOSIS — Z20828 Contact with and (suspected) exposure to other viral communicable diseases: Secondary | ICD-10-CM | POA: Diagnosis not present

## 2020-07-26 DIAGNOSIS — U071 COVID-19: Secondary | ICD-10-CM | POA: Diagnosis not present

## 2020-08-02 DIAGNOSIS — U071 COVID-19: Secondary | ICD-10-CM | POA: Diagnosis not present

## 2020-08-02 DIAGNOSIS — Z20828 Contact with and (suspected) exposure to other viral communicable diseases: Secondary | ICD-10-CM | POA: Diagnosis not present

## 2020-08-09 DIAGNOSIS — U071 COVID-19: Secondary | ICD-10-CM | POA: Diagnosis not present

## 2020-08-09 DIAGNOSIS — Z20828 Contact with and (suspected) exposure to other viral communicable diseases: Secondary | ICD-10-CM | POA: Diagnosis not present

## 2020-08-16 DIAGNOSIS — U071 COVID-19: Secondary | ICD-10-CM | POA: Diagnosis not present

## 2020-08-16 DIAGNOSIS — Z20828 Contact with and (suspected) exposure to other viral communicable diseases: Secondary | ICD-10-CM | POA: Diagnosis not present

## 2020-08-22 DIAGNOSIS — K219 Gastro-esophageal reflux disease without esophagitis: Secondary | ICD-10-CM | POA: Diagnosis not present

## 2020-08-22 DIAGNOSIS — F32A Depression, unspecified: Secondary | ICD-10-CM | POA: Diagnosis not present

## 2020-08-24 DIAGNOSIS — U071 COVID-19: Secondary | ICD-10-CM | POA: Diagnosis not present

## 2020-08-24 DIAGNOSIS — Z20828 Contact with and (suspected) exposure to other viral communicable diseases: Secondary | ICD-10-CM | POA: Diagnosis not present

## 2020-08-30 DIAGNOSIS — Z20828 Contact with and (suspected) exposure to other viral communicable diseases: Secondary | ICD-10-CM | POA: Diagnosis not present

## 2020-08-30 DIAGNOSIS — M79674 Pain in right toe(s): Secondary | ICD-10-CM | POA: Diagnosis not present

## 2020-08-30 DIAGNOSIS — M79675 Pain in left toe(s): Secondary | ICD-10-CM | POA: Diagnosis not present

## 2020-08-30 DIAGNOSIS — U071 COVID-19: Secondary | ICD-10-CM | POA: Diagnosis not present

## 2020-08-30 DIAGNOSIS — B351 Tinea unguium: Secondary | ICD-10-CM | POA: Diagnosis not present

## 2020-09-13 DIAGNOSIS — U071 COVID-19: Secondary | ICD-10-CM | POA: Diagnosis not present

## 2020-09-13 DIAGNOSIS — Z20828 Contact with and (suspected) exposure to other viral communicable diseases: Secondary | ICD-10-CM | POA: Diagnosis not present

## 2020-09-20 DIAGNOSIS — U071 COVID-19: Secondary | ICD-10-CM | POA: Diagnosis not present

## 2020-09-20 DIAGNOSIS — Z20828 Contact with and (suspected) exposure to other viral communicable diseases: Secondary | ICD-10-CM | POA: Diagnosis not present

## 2020-09-22 DIAGNOSIS — K219 Gastro-esophageal reflux disease without esophagitis: Secondary | ICD-10-CM | POA: Diagnosis not present

## 2020-09-22 DIAGNOSIS — F32A Depression, unspecified: Secondary | ICD-10-CM | POA: Diagnosis not present

## 2020-09-27 DIAGNOSIS — U071 COVID-19: Secondary | ICD-10-CM | POA: Diagnosis not present

## 2020-09-27 DIAGNOSIS — Z20828 Contact with and (suspected) exposure to other viral communicable diseases: Secondary | ICD-10-CM | POA: Diagnosis not present

## 2020-10-04 DIAGNOSIS — Z20828 Contact with and (suspected) exposure to other viral communicable diseases: Secondary | ICD-10-CM | POA: Diagnosis not present

## 2020-10-04 DIAGNOSIS — U071 COVID-19: Secondary | ICD-10-CM | POA: Diagnosis not present

## 2020-10-07 DIAGNOSIS — E1121 Type 2 diabetes mellitus with diabetic nephropathy: Secondary | ICD-10-CM | POA: Diagnosis not present

## 2020-10-07 DIAGNOSIS — J449 Chronic obstructive pulmonary disease, unspecified: Secondary | ICD-10-CM | POA: Diagnosis not present

## 2020-10-07 DIAGNOSIS — N1832 Chronic kidney disease, stage 3b: Secondary | ICD-10-CM | POA: Diagnosis not present

## 2020-10-07 DIAGNOSIS — E1165 Type 2 diabetes mellitus with hyperglycemia: Secondary | ICD-10-CM | POA: Diagnosis not present

## 2020-10-07 DIAGNOSIS — Z299 Encounter for prophylactic measures, unspecified: Secondary | ICD-10-CM | POA: Diagnosis not present

## 2020-10-10 DIAGNOSIS — Z9181 History of falling: Secondary | ICD-10-CM | POA: Diagnosis not present

## 2020-10-10 DIAGNOSIS — E1165 Type 2 diabetes mellitus with hyperglycemia: Secondary | ICD-10-CM | POA: Diagnosis not present

## 2020-10-10 DIAGNOSIS — F339 Major depressive disorder, recurrent, unspecified: Secondary | ICD-10-CM | POA: Diagnosis not present

## 2020-10-10 DIAGNOSIS — R2681 Unsteadiness on feet: Secondary | ICD-10-CM | POA: Diagnosis not present

## 2020-10-10 DIAGNOSIS — E1122 Type 2 diabetes mellitus with diabetic chronic kidney disease: Secondary | ICD-10-CM | POA: Diagnosis not present

## 2020-10-10 DIAGNOSIS — N1832 Chronic kidney disease, stage 3b: Secondary | ICD-10-CM | POA: Diagnosis not present

## 2020-10-10 DIAGNOSIS — J440 Chronic obstructive pulmonary disease with acute lower respiratory infection: Secondary | ICD-10-CM | POA: Diagnosis not present

## 2020-10-11 DIAGNOSIS — Z20828 Contact with and (suspected) exposure to other viral communicable diseases: Secondary | ICD-10-CM | POA: Diagnosis not present

## 2020-10-11 DIAGNOSIS — U071 COVID-19: Secondary | ICD-10-CM | POA: Diagnosis not present

## 2020-10-12 DIAGNOSIS — E119 Type 2 diabetes mellitus without complications: Secondary | ICD-10-CM | POA: Diagnosis not present

## 2020-10-12 DIAGNOSIS — Z7984 Long term (current) use of oral hypoglycemic drugs: Secondary | ICD-10-CM | POA: Diagnosis not present

## 2020-10-12 DIAGNOSIS — Z794 Long term (current) use of insulin: Secondary | ICD-10-CM | POA: Diagnosis not present

## 2020-10-12 DIAGNOSIS — H25813 Combined forms of age-related cataract, bilateral: Secondary | ICD-10-CM | POA: Diagnosis not present

## 2020-10-18 DIAGNOSIS — F339 Major depressive disorder, recurrent, unspecified: Secondary | ICD-10-CM | POA: Diagnosis not present

## 2020-10-18 DIAGNOSIS — Z20828 Contact with and (suspected) exposure to other viral communicable diseases: Secondary | ICD-10-CM | POA: Diagnosis not present

## 2020-10-18 DIAGNOSIS — E1122 Type 2 diabetes mellitus with diabetic chronic kidney disease: Secondary | ICD-10-CM | POA: Diagnosis not present

## 2020-10-18 DIAGNOSIS — U071 COVID-19: Secondary | ICD-10-CM | POA: Diagnosis not present

## 2020-10-18 DIAGNOSIS — N1832 Chronic kidney disease, stage 3b: Secondary | ICD-10-CM | POA: Diagnosis not present

## 2020-10-18 DIAGNOSIS — E1165 Type 2 diabetes mellitus with hyperglycemia: Secondary | ICD-10-CM | POA: Diagnosis not present

## 2020-10-18 DIAGNOSIS — R2681 Unsteadiness on feet: Secondary | ICD-10-CM | POA: Diagnosis not present

## 2020-10-18 DIAGNOSIS — J440 Chronic obstructive pulmonary disease with acute lower respiratory infection: Secondary | ICD-10-CM | POA: Diagnosis not present

## 2020-10-19 DIAGNOSIS — R2681 Unsteadiness on feet: Secondary | ICD-10-CM | POA: Diagnosis not present

## 2020-10-19 DIAGNOSIS — N1832 Chronic kidney disease, stage 3b: Secondary | ICD-10-CM | POA: Diagnosis not present

## 2020-10-19 DIAGNOSIS — E1165 Type 2 diabetes mellitus with hyperglycemia: Secondary | ICD-10-CM | POA: Diagnosis not present

## 2020-10-19 DIAGNOSIS — E1122 Type 2 diabetes mellitus with diabetic chronic kidney disease: Secondary | ICD-10-CM | POA: Diagnosis not present

## 2020-10-19 DIAGNOSIS — J440 Chronic obstructive pulmonary disease with acute lower respiratory infection: Secondary | ICD-10-CM | POA: Diagnosis not present

## 2020-10-19 DIAGNOSIS — F339 Major depressive disorder, recurrent, unspecified: Secondary | ICD-10-CM | POA: Diagnosis not present

## 2020-10-21 DIAGNOSIS — R2681 Unsteadiness on feet: Secondary | ICD-10-CM | POA: Diagnosis not present

## 2020-10-21 DIAGNOSIS — E1122 Type 2 diabetes mellitus with diabetic chronic kidney disease: Secondary | ICD-10-CM | POA: Diagnosis not present

## 2020-10-21 DIAGNOSIS — N1832 Chronic kidney disease, stage 3b: Secondary | ICD-10-CM | POA: Diagnosis not present

## 2020-10-21 DIAGNOSIS — J440 Chronic obstructive pulmonary disease with acute lower respiratory infection: Secondary | ICD-10-CM | POA: Diagnosis not present

## 2020-10-21 DIAGNOSIS — E1165 Type 2 diabetes mellitus with hyperglycemia: Secondary | ICD-10-CM | POA: Diagnosis not present

## 2020-10-21 DIAGNOSIS — F339 Major depressive disorder, recurrent, unspecified: Secondary | ICD-10-CM | POA: Diagnosis not present

## 2020-10-24 DIAGNOSIS — N1832 Chronic kidney disease, stage 3b: Secondary | ICD-10-CM | POA: Diagnosis not present

## 2020-10-24 DIAGNOSIS — R2681 Unsteadiness on feet: Secondary | ICD-10-CM | POA: Diagnosis not present

## 2020-10-24 DIAGNOSIS — E1122 Type 2 diabetes mellitus with diabetic chronic kidney disease: Secondary | ICD-10-CM | POA: Diagnosis not present

## 2020-10-24 DIAGNOSIS — E1165 Type 2 diabetes mellitus with hyperglycemia: Secondary | ICD-10-CM | POA: Diagnosis not present

## 2020-10-24 DIAGNOSIS — F339 Major depressive disorder, recurrent, unspecified: Secondary | ICD-10-CM | POA: Diagnosis not present

## 2020-10-24 DIAGNOSIS — J440 Chronic obstructive pulmonary disease with acute lower respiratory infection: Secondary | ICD-10-CM | POA: Diagnosis not present

## 2020-10-25 DIAGNOSIS — F339 Major depressive disorder, recurrent, unspecified: Secondary | ICD-10-CM | POA: Diagnosis not present

## 2020-10-25 DIAGNOSIS — R2681 Unsteadiness on feet: Secondary | ICD-10-CM | POA: Diagnosis not present

## 2020-10-25 DIAGNOSIS — J440 Chronic obstructive pulmonary disease with acute lower respiratory infection: Secondary | ICD-10-CM | POA: Diagnosis not present

## 2020-10-25 DIAGNOSIS — N1832 Chronic kidney disease, stage 3b: Secondary | ICD-10-CM | POA: Diagnosis not present

## 2020-10-25 DIAGNOSIS — Z20828 Contact with and (suspected) exposure to other viral communicable diseases: Secondary | ICD-10-CM | POA: Diagnosis not present

## 2020-10-25 DIAGNOSIS — U071 COVID-19: Secondary | ICD-10-CM | POA: Diagnosis not present

## 2020-10-25 DIAGNOSIS — E1165 Type 2 diabetes mellitus with hyperglycemia: Secondary | ICD-10-CM | POA: Diagnosis not present

## 2020-10-25 DIAGNOSIS — E1122 Type 2 diabetes mellitus with diabetic chronic kidney disease: Secondary | ICD-10-CM | POA: Diagnosis not present

## 2020-10-27 DIAGNOSIS — F339 Major depressive disorder, recurrent, unspecified: Secondary | ICD-10-CM | POA: Diagnosis not present

## 2020-10-27 DIAGNOSIS — N1832 Chronic kidney disease, stage 3b: Secondary | ICD-10-CM | POA: Diagnosis not present

## 2020-10-27 DIAGNOSIS — J440 Chronic obstructive pulmonary disease with acute lower respiratory infection: Secondary | ICD-10-CM | POA: Diagnosis not present

## 2020-10-27 DIAGNOSIS — E1122 Type 2 diabetes mellitus with diabetic chronic kidney disease: Secondary | ICD-10-CM | POA: Diagnosis not present

## 2020-10-27 DIAGNOSIS — R2681 Unsteadiness on feet: Secondary | ICD-10-CM | POA: Diagnosis not present

## 2020-10-27 DIAGNOSIS — E1165 Type 2 diabetes mellitus with hyperglycemia: Secondary | ICD-10-CM | POA: Diagnosis not present

## 2020-10-31 DIAGNOSIS — N1832 Chronic kidney disease, stage 3b: Secondary | ICD-10-CM | POA: Diagnosis not present

## 2020-10-31 DIAGNOSIS — R2681 Unsteadiness on feet: Secondary | ICD-10-CM | POA: Diagnosis not present

## 2020-10-31 DIAGNOSIS — E1165 Type 2 diabetes mellitus with hyperglycemia: Secondary | ICD-10-CM | POA: Diagnosis not present

## 2020-10-31 DIAGNOSIS — J440 Chronic obstructive pulmonary disease with acute lower respiratory infection: Secondary | ICD-10-CM | POA: Diagnosis not present

## 2020-10-31 DIAGNOSIS — F339 Major depressive disorder, recurrent, unspecified: Secondary | ICD-10-CM | POA: Diagnosis not present

## 2020-10-31 DIAGNOSIS — E1122 Type 2 diabetes mellitus with diabetic chronic kidney disease: Secondary | ICD-10-CM | POA: Diagnosis not present

## 2020-11-01 DIAGNOSIS — Z20828 Contact with and (suspected) exposure to other viral communicable diseases: Secondary | ICD-10-CM | POA: Diagnosis not present

## 2020-11-01 DIAGNOSIS — U071 COVID-19: Secondary | ICD-10-CM | POA: Diagnosis not present

## 2020-11-02 DIAGNOSIS — E1165 Type 2 diabetes mellitus with hyperglycemia: Secondary | ICD-10-CM | POA: Diagnosis not present

## 2020-11-02 DIAGNOSIS — N1832 Chronic kidney disease, stage 3b: Secondary | ICD-10-CM | POA: Diagnosis not present

## 2020-11-02 DIAGNOSIS — F339 Major depressive disorder, recurrent, unspecified: Secondary | ICD-10-CM | POA: Diagnosis not present

## 2020-11-02 DIAGNOSIS — E1122 Type 2 diabetes mellitus with diabetic chronic kidney disease: Secondary | ICD-10-CM | POA: Diagnosis not present

## 2020-11-02 DIAGNOSIS — J440 Chronic obstructive pulmonary disease with acute lower respiratory infection: Secondary | ICD-10-CM | POA: Diagnosis not present

## 2020-11-02 DIAGNOSIS — R2681 Unsteadiness on feet: Secondary | ICD-10-CM | POA: Diagnosis not present

## 2020-11-03 DIAGNOSIS — E1165 Type 2 diabetes mellitus with hyperglycemia: Secondary | ICD-10-CM | POA: Diagnosis not present

## 2020-11-03 DIAGNOSIS — N1832 Chronic kidney disease, stage 3b: Secondary | ICD-10-CM | POA: Diagnosis not present

## 2020-11-03 DIAGNOSIS — J440 Chronic obstructive pulmonary disease with acute lower respiratory infection: Secondary | ICD-10-CM | POA: Diagnosis not present

## 2020-11-03 DIAGNOSIS — E1122 Type 2 diabetes mellitus with diabetic chronic kidney disease: Secondary | ICD-10-CM | POA: Diagnosis not present

## 2020-11-03 DIAGNOSIS — R2681 Unsteadiness on feet: Secondary | ICD-10-CM | POA: Diagnosis not present

## 2020-11-03 DIAGNOSIS — F339 Major depressive disorder, recurrent, unspecified: Secondary | ICD-10-CM | POA: Diagnosis not present

## 2020-11-08 DIAGNOSIS — Z20828 Contact with and (suspected) exposure to other viral communicable diseases: Secondary | ICD-10-CM | POA: Diagnosis not present

## 2020-11-08 DIAGNOSIS — M79675 Pain in left toe(s): Secondary | ICD-10-CM | POA: Diagnosis not present

## 2020-11-08 DIAGNOSIS — M79674 Pain in right toe(s): Secondary | ICD-10-CM | POA: Diagnosis not present

## 2020-11-08 DIAGNOSIS — U071 COVID-19: Secondary | ICD-10-CM | POA: Diagnosis not present

## 2020-11-08 DIAGNOSIS — B351 Tinea unguium: Secondary | ICD-10-CM | POA: Diagnosis not present

## 2020-11-09 DIAGNOSIS — F339 Major depressive disorder, recurrent, unspecified: Secondary | ICD-10-CM | POA: Diagnosis not present

## 2020-11-09 DIAGNOSIS — J440 Chronic obstructive pulmonary disease with acute lower respiratory infection: Secondary | ICD-10-CM | POA: Diagnosis not present

## 2020-11-09 DIAGNOSIS — R2681 Unsteadiness on feet: Secondary | ICD-10-CM | POA: Diagnosis not present

## 2020-11-09 DIAGNOSIS — Z9181 History of falling: Secondary | ICD-10-CM | POA: Diagnosis not present

## 2020-11-09 DIAGNOSIS — E1122 Type 2 diabetes mellitus with diabetic chronic kidney disease: Secondary | ICD-10-CM | POA: Diagnosis not present

## 2020-11-09 DIAGNOSIS — E1165 Type 2 diabetes mellitus with hyperglycemia: Secondary | ICD-10-CM | POA: Diagnosis not present

## 2020-11-09 DIAGNOSIS — N1832 Chronic kidney disease, stage 3b: Secondary | ICD-10-CM | POA: Diagnosis not present

## 2020-11-14 DIAGNOSIS — E1165 Type 2 diabetes mellitus with hyperglycemia: Secondary | ICD-10-CM | POA: Diagnosis not present

## 2020-11-14 DIAGNOSIS — F339 Major depressive disorder, recurrent, unspecified: Secondary | ICD-10-CM | POA: Diagnosis not present

## 2020-11-14 DIAGNOSIS — N1832 Chronic kidney disease, stage 3b: Secondary | ICD-10-CM | POA: Diagnosis not present

## 2020-11-14 DIAGNOSIS — E1122 Type 2 diabetes mellitus with diabetic chronic kidney disease: Secondary | ICD-10-CM | POA: Diagnosis not present

## 2020-11-14 DIAGNOSIS — J440 Chronic obstructive pulmonary disease with acute lower respiratory infection: Secondary | ICD-10-CM | POA: Diagnosis not present

## 2020-11-14 DIAGNOSIS — R2681 Unsteadiness on feet: Secondary | ICD-10-CM | POA: Diagnosis not present

## 2020-11-16 DIAGNOSIS — U071 COVID-19: Secondary | ICD-10-CM | POA: Diagnosis not present

## 2020-11-16 DIAGNOSIS — Z20828 Contact with and (suspected) exposure to other viral communicable diseases: Secondary | ICD-10-CM | POA: Diagnosis not present

## 2020-11-17 DIAGNOSIS — R2681 Unsteadiness on feet: Secondary | ICD-10-CM | POA: Diagnosis not present

## 2020-11-17 DIAGNOSIS — E1165 Type 2 diabetes mellitus with hyperglycemia: Secondary | ICD-10-CM | POA: Diagnosis not present

## 2020-11-17 DIAGNOSIS — F339 Major depressive disorder, recurrent, unspecified: Secondary | ICD-10-CM | POA: Diagnosis not present

## 2020-11-17 DIAGNOSIS — N1832 Chronic kidney disease, stage 3b: Secondary | ICD-10-CM | POA: Diagnosis not present

## 2020-11-17 DIAGNOSIS — E1122 Type 2 diabetes mellitus with diabetic chronic kidney disease: Secondary | ICD-10-CM | POA: Diagnosis not present

## 2020-11-17 DIAGNOSIS — J440 Chronic obstructive pulmonary disease with acute lower respiratory infection: Secondary | ICD-10-CM | POA: Diagnosis not present

## 2020-11-18 DIAGNOSIS — E1122 Type 2 diabetes mellitus with diabetic chronic kidney disease: Secondary | ICD-10-CM | POA: Diagnosis not present

## 2020-11-18 DIAGNOSIS — R2681 Unsteadiness on feet: Secondary | ICD-10-CM | POA: Diagnosis not present

## 2020-11-18 DIAGNOSIS — J440 Chronic obstructive pulmonary disease with acute lower respiratory infection: Secondary | ICD-10-CM | POA: Diagnosis not present

## 2020-11-18 DIAGNOSIS — E1165 Type 2 diabetes mellitus with hyperglycemia: Secondary | ICD-10-CM | POA: Diagnosis not present

## 2020-11-18 DIAGNOSIS — N1832 Chronic kidney disease, stage 3b: Secondary | ICD-10-CM | POA: Diagnosis not present

## 2020-11-18 DIAGNOSIS — F339 Major depressive disorder, recurrent, unspecified: Secondary | ICD-10-CM | POA: Diagnosis not present

## 2020-11-21 DIAGNOSIS — E1122 Type 2 diabetes mellitus with diabetic chronic kidney disease: Secondary | ICD-10-CM | POA: Diagnosis not present

## 2020-11-21 DIAGNOSIS — E1165 Type 2 diabetes mellitus with hyperglycemia: Secondary | ICD-10-CM | POA: Diagnosis not present

## 2020-11-21 DIAGNOSIS — Z20828 Contact with and (suspected) exposure to other viral communicable diseases: Secondary | ICD-10-CM | POA: Diagnosis not present

## 2020-11-21 DIAGNOSIS — R2681 Unsteadiness on feet: Secondary | ICD-10-CM | POA: Diagnosis not present

## 2020-11-21 DIAGNOSIS — U071 COVID-19: Secondary | ICD-10-CM | POA: Diagnosis not present

## 2020-11-21 DIAGNOSIS — J440 Chronic obstructive pulmonary disease with acute lower respiratory infection: Secondary | ICD-10-CM | POA: Diagnosis not present

## 2020-11-21 DIAGNOSIS — F339 Major depressive disorder, recurrent, unspecified: Secondary | ICD-10-CM | POA: Diagnosis not present

## 2020-11-21 DIAGNOSIS — N1832 Chronic kidney disease, stage 3b: Secondary | ICD-10-CM | POA: Diagnosis not present

## 2020-11-22 DIAGNOSIS — F339 Major depressive disorder, recurrent, unspecified: Secondary | ICD-10-CM | POA: Diagnosis not present

## 2020-11-22 DIAGNOSIS — E1165 Type 2 diabetes mellitus with hyperglycemia: Secondary | ICD-10-CM | POA: Diagnosis not present

## 2020-11-22 DIAGNOSIS — R2681 Unsteadiness on feet: Secondary | ICD-10-CM | POA: Diagnosis not present

## 2020-11-22 DIAGNOSIS — E1122 Type 2 diabetes mellitus with diabetic chronic kidney disease: Secondary | ICD-10-CM | POA: Diagnosis not present

## 2020-11-22 DIAGNOSIS — N1832 Chronic kidney disease, stage 3b: Secondary | ICD-10-CM | POA: Diagnosis not present

## 2020-11-22 DIAGNOSIS — J440 Chronic obstructive pulmonary disease with acute lower respiratory infection: Secondary | ICD-10-CM | POA: Diagnosis not present

## 2020-11-23 DIAGNOSIS — F32A Depression, unspecified: Secondary | ICD-10-CM | POA: Diagnosis not present

## 2020-11-23 DIAGNOSIS — R2681 Unsteadiness on feet: Secondary | ICD-10-CM | POA: Diagnosis not present

## 2020-11-23 DIAGNOSIS — J440 Chronic obstructive pulmonary disease with acute lower respiratory infection: Secondary | ICD-10-CM | POA: Diagnosis not present

## 2020-11-23 DIAGNOSIS — E1165 Type 2 diabetes mellitus with hyperglycemia: Secondary | ICD-10-CM | POA: Diagnosis not present

## 2020-11-23 DIAGNOSIS — K219 Gastro-esophageal reflux disease without esophagitis: Secondary | ICD-10-CM | POA: Diagnosis not present

## 2020-11-23 DIAGNOSIS — N1832 Chronic kidney disease, stage 3b: Secondary | ICD-10-CM | POA: Diagnosis not present

## 2020-11-23 DIAGNOSIS — F339 Major depressive disorder, recurrent, unspecified: Secondary | ICD-10-CM | POA: Diagnosis not present

## 2020-11-23 DIAGNOSIS — E1122 Type 2 diabetes mellitus with diabetic chronic kidney disease: Secondary | ICD-10-CM | POA: Diagnosis not present

## 2020-11-29 DIAGNOSIS — F339 Major depressive disorder, recurrent, unspecified: Secondary | ICD-10-CM | POA: Diagnosis not present

## 2020-11-29 DIAGNOSIS — Z20828 Contact with and (suspected) exposure to other viral communicable diseases: Secondary | ICD-10-CM | POA: Diagnosis not present

## 2020-11-29 DIAGNOSIS — N1832 Chronic kidney disease, stage 3b: Secondary | ICD-10-CM | POA: Diagnosis not present

## 2020-11-29 DIAGNOSIS — U071 COVID-19: Secondary | ICD-10-CM | POA: Diagnosis not present

## 2020-11-29 DIAGNOSIS — R2681 Unsteadiness on feet: Secondary | ICD-10-CM | POA: Diagnosis not present

## 2020-11-29 DIAGNOSIS — J440 Chronic obstructive pulmonary disease with acute lower respiratory infection: Secondary | ICD-10-CM | POA: Diagnosis not present

## 2020-11-29 DIAGNOSIS — E1165 Type 2 diabetes mellitus with hyperglycemia: Secondary | ICD-10-CM | POA: Diagnosis not present

## 2020-11-29 DIAGNOSIS — E1122 Type 2 diabetes mellitus with diabetic chronic kidney disease: Secondary | ICD-10-CM | POA: Diagnosis not present

## 2020-11-30 DIAGNOSIS — F339 Major depressive disorder, recurrent, unspecified: Secondary | ICD-10-CM | POA: Diagnosis not present

## 2020-11-30 DIAGNOSIS — J440 Chronic obstructive pulmonary disease with acute lower respiratory infection: Secondary | ICD-10-CM | POA: Diagnosis not present

## 2020-11-30 DIAGNOSIS — R2681 Unsteadiness on feet: Secondary | ICD-10-CM | POA: Diagnosis not present

## 2020-11-30 DIAGNOSIS — E1122 Type 2 diabetes mellitus with diabetic chronic kidney disease: Secondary | ICD-10-CM | POA: Diagnosis not present

## 2020-11-30 DIAGNOSIS — N1832 Chronic kidney disease, stage 3b: Secondary | ICD-10-CM | POA: Diagnosis not present

## 2020-11-30 DIAGNOSIS — E1165 Type 2 diabetes mellitus with hyperglycemia: Secondary | ICD-10-CM | POA: Diagnosis not present

## 2020-12-01 DIAGNOSIS — R2681 Unsteadiness on feet: Secondary | ICD-10-CM | POA: Diagnosis not present

## 2020-12-01 DIAGNOSIS — J440 Chronic obstructive pulmonary disease with acute lower respiratory infection: Secondary | ICD-10-CM | POA: Diagnosis not present

## 2020-12-01 DIAGNOSIS — E1165 Type 2 diabetes mellitus with hyperglycemia: Secondary | ICD-10-CM | POA: Diagnosis not present

## 2020-12-01 DIAGNOSIS — N1832 Chronic kidney disease, stage 3b: Secondary | ICD-10-CM | POA: Diagnosis not present

## 2020-12-01 DIAGNOSIS — E1122 Type 2 diabetes mellitus with diabetic chronic kidney disease: Secondary | ICD-10-CM | POA: Diagnosis not present

## 2020-12-01 DIAGNOSIS — F339 Major depressive disorder, recurrent, unspecified: Secondary | ICD-10-CM | POA: Diagnosis not present

## 2020-12-02 DIAGNOSIS — Z299 Encounter for prophylactic measures, unspecified: Secondary | ICD-10-CM | POA: Diagnosis not present

## 2020-12-02 DIAGNOSIS — U071 COVID-19: Secondary | ICD-10-CM | POA: Diagnosis not present

## 2020-12-02 DIAGNOSIS — E1165 Type 2 diabetes mellitus with hyperglycemia: Secondary | ICD-10-CM | POA: Diagnosis not present

## 2020-12-05 DIAGNOSIS — F339 Major depressive disorder, recurrent, unspecified: Secondary | ICD-10-CM | POA: Diagnosis not present

## 2020-12-05 DIAGNOSIS — R2681 Unsteadiness on feet: Secondary | ICD-10-CM | POA: Diagnosis not present

## 2020-12-05 DIAGNOSIS — J440 Chronic obstructive pulmonary disease with acute lower respiratory infection: Secondary | ICD-10-CM | POA: Diagnosis not present

## 2020-12-05 DIAGNOSIS — N1832 Chronic kidney disease, stage 3b: Secondary | ICD-10-CM | POA: Diagnosis not present

## 2020-12-05 DIAGNOSIS — E1165 Type 2 diabetes mellitus with hyperglycemia: Secondary | ICD-10-CM | POA: Diagnosis not present

## 2020-12-05 DIAGNOSIS — E1122 Type 2 diabetes mellitus with diabetic chronic kidney disease: Secondary | ICD-10-CM | POA: Diagnosis not present

## 2020-12-08 DIAGNOSIS — J440 Chronic obstructive pulmonary disease with acute lower respiratory infection: Secondary | ICD-10-CM | POA: Diagnosis not present

## 2020-12-08 DIAGNOSIS — F339 Major depressive disorder, recurrent, unspecified: Secondary | ICD-10-CM | POA: Diagnosis not present

## 2020-12-08 DIAGNOSIS — R2681 Unsteadiness on feet: Secondary | ICD-10-CM | POA: Diagnosis not present

## 2020-12-08 DIAGNOSIS — N1832 Chronic kidney disease, stage 3b: Secondary | ICD-10-CM | POA: Diagnosis not present

## 2020-12-08 DIAGNOSIS — E1165 Type 2 diabetes mellitus with hyperglycemia: Secondary | ICD-10-CM | POA: Diagnosis not present

## 2020-12-08 DIAGNOSIS — E1122 Type 2 diabetes mellitus with diabetic chronic kidney disease: Secondary | ICD-10-CM | POA: Diagnosis not present

## 2020-12-09 DIAGNOSIS — E1122 Type 2 diabetes mellitus with diabetic chronic kidney disease: Secondary | ICD-10-CM | POA: Diagnosis not present

## 2020-12-09 DIAGNOSIS — F339 Major depressive disorder, recurrent, unspecified: Secondary | ICD-10-CM | POA: Diagnosis not present

## 2020-12-09 DIAGNOSIS — E1165 Type 2 diabetes mellitus with hyperglycemia: Secondary | ICD-10-CM | POA: Diagnosis not present

## 2020-12-09 DIAGNOSIS — Z794 Long term (current) use of insulin: Secondary | ICD-10-CM | POA: Diagnosis not present

## 2020-12-09 DIAGNOSIS — Z7984 Long term (current) use of oral hypoglycemic drugs: Secondary | ICD-10-CM | POA: Diagnosis not present

## 2020-12-09 DIAGNOSIS — J440 Chronic obstructive pulmonary disease with acute lower respiratory infection: Secondary | ICD-10-CM | POA: Diagnosis not present

## 2020-12-09 DIAGNOSIS — N1832 Chronic kidney disease, stage 3b: Secondary | ICD-10-CM | POA: Diagnosis not present

## 2020-12-09 DIAGNOSIS — R2681 Unsteadiness on feet: Secondary | ICD-10-CM | POA: Diagnosis not present

## 2020-12-09 DIAGNOSIS — Z9181 History of falling: Secondary | ICD-10-CM | POA: Diagnosis not present

## 2020-12-12 DIAGNOSIS — N1832 Chronic kidney disease, stage 3b: Secondary | ICD-10-CM | POA: Diagnosis not present

## 2020-12-12 DIAGNOSIS — E1122 Type 2 diabetes mellitus with diabetic chronic kidney disease: Secondary | ICD-10-CM | POA: Diagnosis not present

## 2020-12-12 DIAGNOSIS — E1165 Type 2 diabetes mellitus with hyperglycemia: Secondary | ICD-10-CM | POA: Diagnosis not present

## 2020-12-12 DIAGNOSIS — F339 Major depressive disorder, recurrent, unspecified: Secondary | ICD-10-CM | POA: Diagnosis not present

## 2020-12-12 DIAGNOSIS — J440 Chronic obstructive pulmonary disease with acute lower respiratory infection: Secondary | ICD-10-CM | POA: Diagnosis not present

## 2020-12-12 DIAGNOSIS — R2681 Unsteadiness on feet: Secondary | ICD-10-CM | POA: Diagnosis not present

## 2020-12-13 DIAGNOSIS — E1122 Type 2 diabetes mellitus with diabetic chronic kidney disease: Secondary | ICD-10-CM | POA: Diagnosis not present

## 2020-12-13 DIAGNOSIS — J440 Chronic obstructive pulmonary disease with acute lower respiratory infection: Secondary | ICD-10-CM | POA: Diagnosis not present

## 2020-12-13 DIAGNOSIS — E1165 Type 2 diabetes mellitus with hyperglycemia: Secondary | ICD-10-CM | POA: Diagnosis not present

## 2020-12-13 DIAGNOSIS — R2681 Unsteadiness on feet: Secondary | ICD-10-CM | POA: Diagnosis not present

## 2020-12-13 DIAGNOSIS — F339 Major depressive disorder, recurrent, unspecified: Secondary | ICD-10-CM | POA: Diagnosis not present

## 2020-12-13 DIAGNOSIS — N1832 Chronic kidney disease, stage 3b: Secondary | ICD-10-CM | POA: Diagnosis not present

## 2020-12-19 DIAGNOSIS — R2681 Unsteadiness on feet: Secondary | ICD-10-CM | POA: Diagnosis not present

## 2020-12-19 DIAGNOSIS — J440 Chronic obstructive pulmonary disease with acute lower respiratory infection: Secondary | ICD-10-CM | POA: Diagnosis not present

## 2020-12-19 DIAGNOSIS — N1832 Chronic kidney disease, stage 3b: Secondary | ICD-10-CM | POA: Diagnosis not present

## 2020-12-19 DIAGNOSIS — E1122 Type 2 diabetes mellitus with diabetic chronic kidney disease: Secondary | ICD-10-CM | POA: Diagnosis not present

## 2020-12-19 DIAGNOSIS — F339 Major depressive disorder, recurrent, unspecified: Secondary | ICD-10-CM | POA: Diagnosis not present

## 2020-12-19 DIAGNOSIS — E1165 Type 2 diabetes mellitus with hyperglycemia: Secondary | ICD-10-CM | POA: Diagnosis not present

## 2020-12-20 DIAGNOSIS — Z125 Encounter for screening for malignant neoplasm of prostate: Secondary | ICD-10-CM | POA: Diagnosis not present

## 2020-12-20 DIAGNOSIS — R5383 Other fatigue: Secondary | ICD-10-CM | POA: Diagnosis not present

## 2020-12-20 DIAGNOSIS — E1165 Type 2 diabetes mellitus with hyperglycemia: Secondary | ICD-10-CM | POA: Diagnosis not present

## 2020-12-20 DIAGNOSIS — E7800 Pure hypercholesterolemia, unspecified: Secondary | ICD-10-CM | POA: Diagnosis not present

## 2020-12-20 DIAGNOSIS — Z6826 Body mass index (BMI) 26.0-26.9, adult: Secondary | ICD-10-CM | POA: Diagnosis not present

## 2020-12-20 DIAGNOSIS — Z299 Encounter for prophylactic measures, unspecified: Secondary | ICD-10-CM | POA: Diagnosis not present

## 2020-12-20 DIAGNOSIS — Z1339 Encounter for screening examination for other mental health and behavioral disorders: Secondary | ICD-10-CM | POA: Diagnosis not present

## 2020-12-20 DIAGNOSIS — Z7189 Other specified counseling: Secondary | ICD-10-CM | POA: Diagnosis not present

## 2020-12-20 DIAGNOSIS — Z1331 Encounter for screening for depression: Secondary | ICD-10-CM | POA: Diagnosis not present

## 2020-12-20 DIAGNOSIS — Z79899 Other long term (current) drug therapy: Secondary | ICD-10-CM | POA: Diagnosis not present

## 2020-12-20 DIAGNOSIS — Z0000 Encounter for general adult medical examination without abnormal findings: Secondary | ICD-10-CM | POA: Diagnosis not present

## 2020-12-22 DIAGNOSIS — R2681 Unsteadiness on feet: Secondary | ICD-10-CM | POA: Diagnosis not present

## 2020-12-22 DIAGNOSIS — N1832 Chronic kidney disease, stage 3b: Secondary | ICD-10-CM | POA: Diagnosis not present

## 2020-12-22 DIAGNOSIS — E1122 Type 2 diabetes mellitus with diabetic chronic kidney disease: Secondary | ICD-10-CM | POA: Diagnosis not present

## 2020-12-22 DIAGNOSIS — J440 Chronic obstructive pulmonary disease with acute lower respiratory infection: Secondary | ICD-10-CM | POA: Diagnosis not present

## 2020-12-22 DIAGNOSIS — F339 Major depressive disorder, recurrent, unspecified: Secondary | ICD-10-CM | POA: Diagnosis not present

## 2020-12-22 DIAGNOSIS — E1165 Type 2 diabetes mellitus with hyperglycemia: Secondary | ICD-10-CM | POA: Diagnosis not present

## 2020-12-27 DIAGNOSIS — F339 Major depressive disorder, recurrent, unspecified: Secondary | ICD-10-CM | POA: Diagnosis not present

## 2020-12-27 DIAGNOSIS — E1122 Type 2 diabetes mellitus with diabetic chronic kidney disease: Secondary | ICD-10-CM | POA: Diagnosis not present

## 2020-12-27 DIAGNOSIS — E1165 Type 2 diabetes mellitus with hyperglycemia: Secondary | ICD-10-CM | POA: Diagnosis not present

## 2020-12-27 DIAGNOSIS — N1832 Chronic kidney disease, stage 3b: Secondary | ICD-10-CM | POA: Diagnosis not present

## 2020-12-27 DIAGNOSIS — R2681 Unsteadiness on feet: Secondary | ICD-10-CM | POA: Diagnosis not present

## 2020-12-27 DIAGNOSIS — J440 Chronic obstructive pulmonary disease with acute lower respiratory infection: Secondary | ICD-10-CM | POA: Diagnosis not present

## 2021-01-03 DIAGNOSIS — J440 Chronic obstructive pulmonary disease with acute lower respiratory infection: Secondary | ICD-10-CM | POA: Diagnosis not present

## 2021-01-03 DIAGNOSIS — N1832 Chronic kidney disease, stage 3b: Secondary | ICD-10-CM | POA: Diagnosis not present

## 2021-01-03 DIAGNOSIS — F339 Major depressive disorder, recurrent, unspecified: Secondary | ICD-10-CM | POA: Diagnosis not present

## 2021-01-03 DIAGNOSIS — E1122 Type 2 diabetes mellitus with diabetic chronic kidney disease: Secondary | ICD-10-CM | POA: Diagnosis not present

## 2021-01-03 DIAGNOSIS — E1165 Type 2 diabetes mellitus with hyperglycemia: Secondary | ICD-10-CM | POA: Diagnosis not present

## 2021-01-03 DIAGNOSIS — R2681 Unsteadiness on feet: Secondary | ICD-10-CM | POA: Diagnosis not present

## 2021-01-04 DIAGNOSIS — N1832 Chronic kidney disease, stage 3b: Secondary | ICD-10-CM | POA: Diagnosis not present

## 2021-01-04 DIAGNOSIS — F339 Major depressive disorder, recurrent, unspecified: Secondary | ICD-10-CM | POA: Diagnosis not present

## 2021-01-04 DIAGNOSIS — E1165 Type 2 diabetes mellitus with hyperglycemia: Secondary | ICD-10-CM | POA: Diagnosis not present

## 2021-01-04 DIAGNOSIS — Z23 Encounter for immunization: Secondary | ICD-10-CM | POA: Diagnosis not present

## 2021-01-04 DIAGNOSIS — J440 Chronic obstructive pulmonary disease with acute lower respiratory infection: Secondary | ICD-10-CM | POA: Diagnosis not present

## 2021-01-04 DIAGNOSIS — R2681 Unsteadiness on feet: Secondary | ICD-10-CM | POA: Diagnosis not present

## 2021-01-04 DIAGNOSIS — E1122 Type 2 diabetes mellitus with diabetic chronic kidney disease: Secondary | ICD-10-CM | POA: Diagnosis not present

## 2021-01-08 DIAGNOSIS — F339 Major depressive disorder, recurrent, unspecified: Secondary | ICD-10-CM | POA: Diagnosis not present

## 2021-01-08 DIAGNOSIS — J440 Chronic obstructive pulmonary disease with acute lower respiratory infection: Secondary | ICD-10-CM | POA: Diagnosis not present

## 2021-01-08 DIAGNOSIS — Z794 Long term (current) use of insulin: Secondary | ICD-10-CM | POA: Diagnosis not present

## 2021-01-08 DIAGNOSIS — Z7984 Long term (current) use of oral hypoglycemic drugs: Secondary | ICD-10-CM | POA: Diagnosis not present

## 2021-01-08 DIAGNOSIS — N1832 Chronic kidney disease, stage 3b: Secondary | ICD-10-CM | POA: Diagnosis not present

## 2021-01-08 DIAGNOSIS — Z9181 History of falling: Secondary | ICD-10-CM | POA: Diagnosis not present

## 2021-01-08 DIAGNOSIS — R2681 Unsteadiness on feet: Secondary | ICD-10-CM | POA: Diagnosis not present

## 2021-01-08 DIAGNOSIS — E1122 Type 2 diabetes mellitus with diabetic chronic kidney disease: Secondary | ICD-10-CM | POA: Diagnosis not present

## 2021-01-08 DIAGNOSIS — E1165 Type 2 diabetes mellitus with hyperglycemia: Secondary | ICD-10-CM | POA: Diagnosis not present

## 2021-01-09 DIAGNOSIS — E1165 Type 2 diabetes mellitus with hyperglycemia: Secondary | ICD-10-CM | POA: Diagnosis not present

## 2021-01-09 DIAGNOSIS — N1832 Chronic kidney disease, stage 3b: Secondary | ICD-10-CM | POA: Diagnosis not present

## 2021-01-09 DIAGNOSIS — R2681 Unsteadiness on feet: Secondary | ICD-10-CM | POA: Diagnosis not present

## 2021-01-09 DIAGNOSIS — F339 Major depressive disorder, recurrent, unspecified: Secondary | ICD-10-CM | POA: Diagnosis not present

## 2021-01-09 DIAGNOSIS — E1122 Type 2 diabetes mellitus with diabetic chronic kidney disease: Secondary | ICD-10-CM | POA: Diagnosis not present

## 2021-01-09 DIAGNOSIS — J440 Chronic obstructive pulmonary disease with acute lower respiratory infection: Secondary | ICD-10-CM | POA: Diagnosis not present

## 2021-01-11 DIAGNOSIS — R2681 Unsteadiness on feet: Secondary | ICD-10-CM | POA: Diagnosis not present

## 2021-01-11 DIAGNOSIS — N1832 Chronic kidney disease, stage 3b: Secondary | ICD-10-CM | POA: Diagnosis not present

## 2021-01-11 DIAGNOSIS — E1122 Type 2 diabetes mellitus with diabetic chronic kidney disease: Secondary | ICD-10-CM | POA: Diagnosis not present

## 2021-01-11 DIAGNOSIS — F339 Major depressive disorder, recurrent, unspecified: Secondary | ICD-10-CM | POA: Diagnosis not present

## 2021-01-11 DIAGNOSIS — E1165 Type 2 diabetes mellitus with hyperglycemia: Secondary | ICD-10-CM | POA: Diagnosis not present

## 2021-01-11 DIAGNOSIS — J440 Chronic obstructive pulmonary disease with acute lower respiratory infection: Secondary | ICD-10-CM | POA: Diagnosis not present

## 2021-01-17 DIAGNOSIS — M79675 Pain in left toe(s): Secondary | ICD-10-CM | POA: Diagnosis not present

## 2021-01-17 DIAGNOSIS — B351 Tinea unguium: Secondary | ICD-10-CM | POA: Diagnosis not present

## 2021-01-17 DIAGNOSIS — M79674 Pain in right toe(s): Secondary | ICD-10-CM | POA: Diagnosis not present

## 2021-01-23 DIAGNOSIS — F32A Depression, unspecified: Secondary | ICD-10-CM | POA: Diagnosis not present

## 2021-01-23 DIAGNOSIS — K219 Gastro-esophageal reflux disease without esophagitis: Secondary | ICD-10-CM | POA: Diagnosis not present

## 2021-02-01 ENCOUNTER — Emergency Department (HOSPITAL_COMMUNITY): Payer: Medicare Other

## 2021-02-01 ENCOUNTER — Encounter (HOSPITAL_COMMUNITY): Payer: Self-pay | Admitting: Radiology

## 2021-02-01 ENCOUNTER — Inpatient Hospital Stay (HOSPITAL_COMMUNITY)
Admission: EM | Admit: 2021-02-01 | Discharge: 2021-02-07 | DRG: 522 | Disposition: A | Discharge status: Skilled Nursing Facility | Payer: Medicare Other | Source: Skilled Nursing Facility | Attending: Internal Medicine | Admitting: Internal Medicine

## 2021-02-01 ENCOUNTER — Other Ambulatory Visit: Payer: Self-pay

## 2021-02-01 DIAGNOSIS — F32A Depression, unspecified: Secondary | ICD-10-CM | POA: Diagnosis not present

## 2021-02-01 DIAGNOSIS — Z794 Long term (current) use of insulin: Secondary | ICD-10-CM

## 2021-02-01 DIAGNOSIS — F03A0 Unspecified dementia, mild, without behavioral disturbance, psychotic disturbance, mood disturbance, and anxiety: Secondary | ICD-10-CM | POA: Diagnosis present

## 2021-02-01 DIAGNOSIS — Z7982 Long term (current) use of aspirin: Secondary | ICD-10-CM

## 2021-02-01 DIAGNOSIS — J449 Chronic obstructive pulmonary disease, unspecified: Secondary | ICD-10-CM | POA: Diagnosis not present

## 2021-02-01 DIAGNOSIS — N390 Urinary tract infection, site not specified: Secondary | ICD-10-CM | POA: Diagnosis not present

## 2021-02-01 DIAGNOSIS — Z79899 Other long term (current) drug therapy: Secondary | ICD-10-CM

## 2021-02-01 DIAGNOSIS — N400 Enlarged prostate without lower urinary tract symptoms: Secondary | ICD-10-CM | POA: Diagnosis present

## 2021-02-01 DIAGNOSIS — E119 Type 2 diabetes mellitus without complications: Secondary | ICD-10-CM

## 2021-02-01 DIAGNOSIS — Z823 Family history of stroke: Secondary | ICD-10-CM

## 2021-02-01 DIAGNOSIS — D631 Anemia in chronic kidney disease: Secondary | ICD-10-CM | POA: Diagnosis present

## 2021-02-01 DIAGNOSIS — F17200 Nicotine dependence, unspecified, uncomplicated: Secondary | ICD-10-CM | POA: Diagnosis present

## 2021-02-01 DIAGNOSIS — Y92129 Unspecified place in nursing home as the place of occurrence of the external cause: Secondary | ICD-10-CM

## 2021-02-01 DIAGNOSIS — E1122 Type 2 diabetes mellitus with diabetic chronic kidney disease: Secondary | ICD-10-CM | POA: Diagnosis not present

## 2021-02-01 DIAGNOSIS — Z471 Aftercare following joint replacement surgery: Secondary | ICD-10-CM | POA: Diagnosis not present

## 2021-02-01 DIAGNOSIS — R509 Fever, unspecified: Secondary | ICD-10-CM

## 2021-02-01 DIAGNOSIS — Z741 Need for assistance with personal care: Secondary | ICD-10-CM | POA: Diagnosis not present

## 2021-02-01 DIAGNOSIS — E1129 Type 2 diabetes mellitus with other diabetic kidney complication: Secondary | ICD-10-CM | POA: Diagnosis not present

## 2021-02-01 DIAGNOSIS — I10 Essential (primary) hypertension: Secondary | ICD-10-CM | POA: Diagnosis not present

## 2021-02-01 DIAGNOSIS — Z7984 Long term (current) use of oral hypoglycemic drugs: Secondary | ICD-10-CM | POA: Diagnosis not present

## 2021-02-01 DIAGNOSIS — Z9181 History of falling: Secondary | ICD-10-CM | POA: Diagnosis not present

## 2021-02-01 DIAGNOSIS — S72009A Fracture of unspecified part of neck of unspecified femur, initial encounter for closed fracture: Secondary | ICD-10-CM | POA: Diagnosis present

## 2021-02-01 DIAGNOSIS — S8002XA Contusion of left knee, initial encounter: Secondary | ICD-10-CM

## 2021-02-01 DIAGNOSIS — F339 Major depressive disorder, recurrent, unspecified: Secondary | ICD-10-CM | POA: Diagnosis not present

## 2021-02-01 DIAGNOSIS — I129 Hypertensive chronic kidney disease with stage 1 through stage 4 chronic kidney disease, or unspecified chronic kidney disease: Secondary | ICD-10-CM | POA: Diagnosis present

## 2021-02-01 DIAGNOSIS — M1990 Unspecified osteoarthritis, unspecified site: Secondary | ICD-10-CM | POA: Diagnosis present

## 2021-02-01 DIAGNOSIS — E1143 Type 2 diabetes mellitus with diabetic autonomic (poly)neuropathy: Secondary | ICD-10-CM | POA: Diagnosis not present

## 2021-02-01 DIAGNOSIS — M25562 Pain in left knee: Secondary | ICD-10-CM | POA: Diagnosis not present

## 2021-02-01 DIAGNOSIS — M159 Polyosteoarthritis, unspecified: Secondary | ICD-10-CM | POA: Diagnosis not present

## 2021-02-01 DIAGNOSIS — W19XXXA Unspecified fall, initial encounter: Secondary | ICD-10-CM

## 2021-02-01 DIAGNOSIS — R262 Difficulty in walking, not elsewhere classified: Secondary | ICD-10-CM | POA: Diagnosis not present

## 2021-02-01 DIAGNOSIS — S8992XA Unspecified injury of left lower leg, initial encounter: Secondary | ICD-10-CM | POA: Diagnosis not present

## 2021-02-01 DIAGNOSIS — R279 Unspecified lack of coordination: Secondary | ICD-10-CM | POA: Diagnosis not present

## 2021-02-01 DIAGNOSIS — Z20822 Contact with and (suspected) exposure to covid-19: Secondary | ICD-10-CM | POA: Diagnosis present

## 2021-02-01 DIAGNOSIS — S72002D Fracture of unspecified part of neck of left femur, subsequent encounter for closed fracture with routine healing: Secondary | ICD-10-CM | POA: Diagnosis not present

## 2021-02-01 DIAGNOSIS — K219 Gastro-esophageal reflux disease without esophagitis: Secondary | ICD-10-CM | POA: Diagnosis present

## 2021-02-01 DIAGNOSIS — W010XXA Fall on same level from slipping, tripping and stumbling without subsequent striking against object, initial encounter: Secondary | ICD-10-CM | POA: Diagnosis present

## 2021-02-01 DIAGNOSIS — R41841 Cognitive communication deficit: Secondary | ICD-10-CM | POA: Diagnosis not present

## 2021-02-01 DIAGNOSIS — E11649 Type 2 diabetes mellitus with hypoglycemia without coma: Secondary | ICD-10-CM | POA: Diagnosis not present

## 2021-02-01 DIAGNOSIS — Z96649 Presence of unspecified artificial hip joint: Secondary | ICD-10-CM

## 2021-02-01 DIAGNOSIS — E785 Hyperlipidemia, unspecified: Secondary | ICD-10-CM | POA: Diagnosis not present

## 2021-02-01 DIAGNOSIS — S72002A Fracture of unspecified part of neck of left femur, initial encounter for closed fracture: Secondary | ICD-10-CM | POA: Diagnosis not present

## 2021-02-01 DIAGNOSIS — E1169 Type 2 diabetes mellitus with other specified complication: Secondary | ICD-10-CM | POA: Diagnosis present

## 2021-02-01 DIAGNOSIS — N1832 Chronic kidney disease, stage 3b: Secondary | ICD-10-CM | POA: Diagnosis not present

## 2021-02-01 DIAGNOSIS — Z96642 Presence of left artificial hip joint: Secondary | ICD-10-CM | POA: Diagnosis not present

## 2021-02-01 DIAGNOSIS — M79605 Pain in left leg: Secondary | ICD-10-CM | POA: Diagnosis not present

## 2021-02-01 DIAGNOSIS — E782 Mixed hyperlipidemia: Secondary | ICD-10-CM | POA: Diagnosis present

## 2021-02-01 DIAGNOSIS — R531 Weakness: Secondary | ICD-10-CM | POA: Diagnosis not present

## 2021-02-01 DIAGNOSIS — R9431 Abnormal electrocardiogram [ECG] [EKG]: Secondary | ICD-10-CM | POA: Diagnosis not present

## 2021-02-01 DIAGNOSIS — R52 Pain, unspecified: Secondary | ICD-10-CM

## 2021-02-01 DIAGNOSIS — Z833 Family history of diabetes mellitus: Secondary | ICD-10-CM

## 2021-02-01 DIAGNOSIS — Z23 Encounter for immunization: Secondary | ICD-10-CM | POA: Diagnosis not present

## 2021-02-01 DIAGNOSIS — S0990XA Unspecified injury of head, initial encounter: Secondary | ICD-10-CM | POA: Diagnosis not present

## 2021-02-01 DIAGNOSIS — R2681 Unsteadiness on feet: Secondary | ICD-10-CM | POA: Diagnosis not present

## 2021-02-01 DIAGNOSIS — M6281 Muscle weakness (generalized): Secondary | ICD-10-CM | POA: Diagnosis not present

## 2021-02-01 LAB — CBC WITH DIFFERENTIAL/PLATELET
Abs Immature Granulocytes: 0.03 10*3/uL (ref 0.00–0.07)
Basophils Absolute: 0.1 10*3/uL (ref 0.0–0.1)
Basophils Relative: 1 %
Eosinophils Absolute: 1.1 10*3/uL — ABNORMAL HIGH (ref 0.0–0.5)
Eosinophils Relative: 10 %
HCT: 34.3 % — ABNORMAL LOW (ref 39.0–52.0)
Hemoglobin: 11.4 g/dL — ABNORMAL LOW (ref 13.0–17.0)
Immature Granulocytes: 0 %
Lymphocytes Relative: 15 %
Lymphs Abs: 1.7 10*3/uL (ref 0.7–4.0)
MCH: 31.1 pg (ref 26.0–34.0)
MCHC: 33.2 g/dL (ref 30.0–36.0)
MCV: 93.5 fL (ref 80.0–100.0)
Monocytes Absolute: 0.9 10*3/uL (ref 0.1–1.0)
Monocytes Relative: 8 %
Neutro Abs: 7.5 10*3/uL (ref 1.7–7.7)
Neutrophils Relative %: 66 %
Platelets: 211 10*3/uL (ref 150–400)
RBC: 3.67 MIL/uL — ABNORMAL LOW (ref 4.22–5.81)
RDW: 13.6 % (ref 11.5–15.5)
WBC: 11.3 10*3/uL — ABNORMAL HIGH (ref 4.0–10.5)
nRBC: 0 % (ref 0.0–0.2)

## 2021-02-01 LAB — BASIC METABOLIC PANEL
Anion gap: 9 (ref 5–15)
BUN: 35 mg/dL — ABNORMAL HIGH (ref 8–23)
CO2: 26 mmol/L (ref 22–32)
Calcium: 9.4 mg/dL (ref 8.9–10.3)
Chloride: 104 mmol/L (ref 98–111)
Creatinine, Ser: 1.64 mg/dL — ABNORMAL HIGH (ref 0.61–1.24)
GFR, Estimated: 44 mL/min — ABNORMAL LOW (ref >60)
Glucose, Bld: 147 mg/dL — ABNORMAL HIGH (ref 70–99)
Potassium: 4.7 mmol/L (ref 3.5–5.1)
Sodium: 139 mmol/L (ref 135–145)

## 2021-02-01 LAB — RESP PANEL BY RT-PCR (FLU A&B, COVID) ARPGX2
Influenza A by PCR: NEGATIVE
Influenza B by PCR: NEGATIVE
SARS Coronavirus 2 by RT PCR: NEGATIVE

## 2021-02-01 MED ORDER — TETANUS-DIPHTH-ACELL PERTUSSIS 5-2.5-18.5 LF-MCG/0.5 IM SUSY
0.5000 mL | PREFILLED_SYRINGE | Freq: Once | INTRAMUSCULAR | Status: AC
  Administered 2021-02-01: 20:00:00 0.5 mL via INTRAMUSCULAR
  Filled 2021-02-01: qty 0.5

## 2021-02-01 MED ORDER — FENTANYL CITRATE PF 50 MCG/ML IJ SOSY
25.0000 ug | PREFILLED_SYRINGE | Freq: Once | INTRAMUSCULAR | Status: AC
  Administered 2021-02-01: 25 ug via INTRAVENOUS
  Filled 2021-02-01: qty 1

## 2021-02-01 MED ORDER — ACETAMINOPHEN 325 MG PO TABS
650.0000 mg | ORAL_TABLET | Freq: Once | ORAL | Status: AC
  Administered 2021-02-01: 17:00:00 650 mg via ORAL
  Filled 2021-02-01: qty 2

## 2021-02-01 NOTE — Discharge Instructions (Addendum)
You likely have a contusion of your left knee.  Apply ice packs on and off, Tylenol every 4 hours if needed for pain you may wear the brace for weightbearing and support.  May remove at nighttime and for bathing.  Call Dr. Mort Sawyers office to arrange follow-up in 1 week if not improving.  The CT scan of your head shows some mild fluid buildup, call Dr. Ronal Fear office for follow-up of this.  Return to the emergency department for any new or worsening symptoms.

## 2021-02-01 NOTE — ED Triage Notes (Signed)
Patient brought in by [facility] for a fall.  EMS states that the facility was having a fire drill and the patient was outside and fell.  Denies loss of consciousness. Complaints of left leg pain.

## 2021-02-01 NOTE — ED Provider Notes (Signed)
Patient signed out to me by Pauline Aus, PA-C at shift change.  He was awaiting call from the hospitalist for medical admission.  Patient from a local nursing facility with a fall and a left hip fracture.  Dr. Romeo Apple has already been notified and will plan to take patient to the OR tomorrow.  Discussed with Dr. Carren Rang who accepts patient for admission.  Results for orders placed or performed during the hospital encounter of 02/01/21  Resp Panel by RT-PCR (Flu A&B, Covid) Nasopharyngeal Swab   Specimen: Nasopharyngeal Swab; Nasopharyngeal(NP) swabs in vial transport medium  Result Value Ref Range   SARS Coronavirus 2 by RT PCR NEGATIVE NEGATIVE   Influenza A by PCR NEGATIVE NEGATIVE   Influenza B by PCR NEGATIVE NEGATIVE  Basic metabolic panel  Result Value Ref Range   Sodium 139 135 - 145 mmol/L   Potassium 4.7 3.5 - 5.1 mmol/L   Chloride 104 98 - 111 mmol/L   CO2 26 22 - 32 mmol/L   Glucose, Bld 147 (H) 70 - 99 mg/dL   BUN 35 (H) 8 - 23 mg/dL   Creatinine, Ser 8.46 (H) 0.61 - 1.24 mg/dL   Calcium 9.4 8.9 - 65.9 mg/dL   GFR, Estimated 44 (L) >60 mL/min   Anion gap 9 5 - 15  CBC with Differential  Result Value Ref Range   WBC 11.3 (H) 4.0 - 10.5 K/uL   RBC 3.67 (L) 4.22 - 5.81 MIL/uL   Hemoglobin 11.4 (L) 13.0 - 17.0 g/dL   HCT 93.5 (L) 70.1 - 77.9 %   MCV 93.5 80.0 - 100.0 fL   MCH 31.1 26.0 - 34.0 pg   MCHC 33.2 30.0 - 36.0 g/dL   RDW 39.0 30.0 - 92.3 %   Platelets 211 150 - 400 K/uL   nRBC 0.0 0.0 - 0.2 %   Neutrophils Relative % 66 %   Neutro Abs 7.5 1.7 - 7.7 K/uL   Lymphocytes Relative 15 %   Lymphs Abs 1.7 0.7 - 4.0 K/uL   Monocytes Relative 8 %   Monocytes Absolute 0.9 0.1 - 1.0 K/uL   Eosinophils Relative 10 %   Eosinophils Absolute 1.1 (H) 0.0 - 0.5 K/uL   Basophils Relative 1 %   Basophils Absolute 0.1 0.0 - 0.1 K/uL   Immature Granulocytes 0 %   Abs Immature Granulocytes 0.03 0.00 - 0.07 K/uL   CT Head Wo Contrast  Result Date: 02/01/2021 CLINICAL  DATA:  Head trauma, minor (Age >= 65y).  Status post fall EXAM: CT HEAD WITHOUT CONTRAST TECHNIQUE: Contiguous axial images were obtained from the base of the skull through the vertex without intravenous contrast. COMPARISON:  CT head 07/04/2017, MR head 07/05/2017 BRAIN: BRAIN Prominence of the lateral ventricles may be related to central predominant atrophy, although a component of normal pressure/communicating hydrocephalus cannot be excluded. patchy and confluent areas of decreased attenuation are noted throughout the deep and periventricular white matter of the cerebral hemispheres bilaterally, compatible with chronic microvascular ischemic disease. No evidence of large-territorial acute infarction. No parenchymal hemorrhage. No mass lesion. No extra-axial collection. No mass effect or midline shift. No hydrocephalus. Basilar cisterns are patent. Vascular: No hyperdense vessel. Skull: No acute fracture or focal lesion. Sinuses/Orbits: Paranasal sinuses and mastoid air cells are clear. The orbits are unremarkable. Other: None. IMPRESSION: 1. No acute intracranial abnormality. 2. Prominence of the lateral ventricles may be related to central predominant atrophy, although a component of normal pressure/communicating hydrocephalus cannot be excluded. Electronically Signed  By: Tish Frederickson M.D.   On: 02/01/2021 17:41   DG Knee Complete 4 Views Left  Result Date: 02/01/2021 CLINICAL DATA:  Fall.  Left knee pain. EXAM: LEFT KNEE - COMPLETE 4+ VIEW COMPARISON:  None. FINDINGS: The mineralization and alignment are normal. There is no evidence of acute fracture or dislocation. The joint spaces are relatively preserved, although there is chondrocalcinosis of both menisci and small joint effusion. There is mild spurring at the quadriceps insertion on the patella. IMPRESSION: No acute osseous findings or significant arthropathic changes. Meniscal chondrocalcinosis and small joint effusion. Electronically Signed    By: Carey Bullocks M.D.   On: 02/01/2021 18:04   DG HIP UNILAT WITH PELVIS 2-3 VIEWS LEFT  Result Date: 02/01/2021 CLINICAL DATA:  Pain after fall today EXAM: DG HIP (WITH OR WITHOUT PELVIS) 2-3V LEFT COMPARISON:  04/08/2013 FINDINGS: Acute left femoral neck fracture with mild displacement. Femoral head projects in joint. IMPRESSION: Acute mildly displaced left femoral neck fracture Electronically Signed   By: Jasmine Pang M.D.   On: 02/01/2021 19:36      Douglas Cook 02/01/21 2126    Cathren Laine, MD 02/06/21 [phone]

## 2021-02-01 NOTE — ED Notes (Signed)
Patient transported to CT 

## 2021-02-01 NOTE — ED Provider Notes (Addendum)
[facility] EMERGENCY DEPARTMENT Provider Note   CSN: [id number] Arrival date & time: 02/01/21  1550     History Chief Complaint  Patient presents with   Douglas Cook is a 75 y.o. male.  The history is provided by the patient and the nursing home.  Fall Pertinent negatives include no chest pain, no abdominal pain, no headaches and no shortness of breath.      Douglas Cook is a 75 y.o. male with past medical history of CKD stage III, COPD, type 2 diabetes, hyperlipidemia who presents to the Emergency Department via EMS from [facility] term care facility complaining of left knee pain secondary to mechanical fall.  Patient states that he was walking outside when he tripped and fell onto the ground, landed on left knee.  He describes throbbing pain to the anterior knee.  Pain worse with movement.  He denies head injury or LOC.  He does not take blood thinners.  No neck or back pain, hip pain headache or dizziness.  Per caregiver at [facility], April, patient was outside during a fire drill when he fell.  Fall was witnessed by other residents.  She states that she was told by the residents that he did strike his head on the sidewalk.  She states that his baseline he is intermittently confused. Unable to bear weight to left leg.    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) 07/04/2017   COPD (chronic obstructive pulmonary disease) ([place]) 07/04/2017   Type 2 diabetes mellitus ([place]) 07/04/2017   GERD (gastroesophageal reflux disease) 07/04/2017    Hyperlipidemia 07/04/2017   Depression 07/04/2017    No past surgical history on file.     Family History  Problem Relation Age of Onset   Stroke Mother    Diabetes Father     Social History   Tobacco Use   Smoking status: Every Day    Types: Pipe   Smokeless tobacco: Never  Substance Use Topics   Alcohol use: No   Drug use: No    Home Medications Prior to Admission medications   Medication Sig Start Date End Date Taking? Authorizing Provider  aspirin EC 81 MG tablet Take 81 mg by mouth daily.    [provider]  atorvastatin (LIPITOR) 10 MG tablet Take 10 mg by mouth daily.    [provider]  cetirizine (ZYRTEC) 10 MG tablet Take 10 mg by mouth at bedtime.    [provider]  cholecalciferol (VITAMIN D) 1000 UNITS tablet Take 2,000 Units by mouth daily.    [provider]  citalopram (CELEXA) 20 MG tablet Take 20 mg by mouth at bedtime.    [provider]  COMBIVENT RESPIMAT 20-100 MCG/ACT AERS respimat Inhale 2 puffs into the lungs 2 (two) times daily. 02/11/13   [provider]  fluticasone (FLONASE) 50 MCG/ACT nasal spray Place 2 sprays into both nostrils daily. 03/02/13   [provider]  Insulin Glargine (TOUJEO MAX  SOLOSTAR) 300 UNIT/ML SOPN Inject 76 Units into the skin at bedtime.    [provider]  insulin lispro (HUMALOG) 100 UNIT/ML injection Inject 15 Units into the skin 3 (three) times daily with meals.     [provider]  lamoTRIgine (LAMICTAL) 100 MG tablet Take 1 tablet by mouth daily. 04/08/13   [provider]  lisinopril (PRINIVIL,ZESTRIL) 2.5 MG tablet Take 2.5 mg by mouth daily.    [provider]  metFORMIN (GLUCOPHAGE) 1000 MG tablet Take 1,000 mg by mouth 2 (two) times daily with a meal.    [provider]  omeprazole (PRILOSEC) 20 MG capsule Take 1 capsule by mouth daily. 04/08/13   [provider]  ONGLYZA 5 MG TABS tablet Take 1  tablet by mouth daily. 04/08/13   [provider]  Propylene Glycol (SYSTANE BALANCE OP) Apply 1 drop to eye 3 (three) times daily.    [provider]  risperiDONE (RISPERDAL) 0.5 MG tablet Take 0.5 mg by mouth at bedtime.    [provider]  tamsulosin (FLOMAX) 0.4 MG CAPS capsule Take 1 capsule by mouth daily. 04/08/13   [provider]  traZODone (DESYREL) 100 MG tablet Take 1 tablet by mouth at bedtime. 04/08/13   [provider]    Allergies    Patient has no known allergies.  Review of Systems   Review of Systems  Constitutional:  Negative for chills, fatigue and fever.  Respiratory:  Negative for shortness of breath.   Cardiovascular:  Negative for chest pain.  Gastrointestinal:  Negative for abdominal pain, nausea and vomiting.  Genitourinary:  Negative for dysuria.  Musculoskeletal:  Positive for arthralgias (Left knee pain). Negative for back pain, myalgias, neck pain and neck stiffness.  Skin:  Negative for rash.  Neurological:  Negative for dizziness, syncope, weakness, numbness and headaches.  Hematological:  Does not bruise/bleed easily.  Psychiatric/Behavioral:  Negative for confusion.    Physical Exam Updated Vital Signs BP (!) 118/53 (BP Location: Left Arm)   Pulse 86   Temp 98.1 F (36.7 C) (Oral)   Resp 16   SpO2 97%   Physical Exam Vitals and nursing note reviewed.  Constitutional:      General: He is not in acute distress.    Appearance: Normal appearance. He is not toxic-appearing.  HENT:     Head: Atraumatic.     Comments: No hematomas of the scalp. Eyes:     Extraocular Movements: Extraocular movements intact.     Conjunctiva/sclera: Conjunctivae normal.     Pupils: Pupils are equal, round, and reactive to light.  Cardiovascular:     Rate and Rhythm: Normal rate and regular rhythm.     Pulses: Normal pulses.  Pulmonary:     Effort: Pulmonary effort is normal. No respiratory distress.  Chest:     Chest  wall: No tenderness.  Abdominal:     Palpations: Abdomen is soft.     Tenderness: There is no abdominal tenderness. There is no guarding or rebound.  Musculoskeletal:        General: Tenderness and signs of injury present. No swelling. Normal range of motion.     Cervical back: Full passive range of motion without pain and normal range of motion. No tenderness.     Left knee: No swelling, deformity or crepitus. Tenderness present. No patellar tendon tenderness.     Instability Tests: Anterior drawer test negative.     Right lower leg: No edema.  Left lower leg: No edema.     Comments: Diffuse tenderness palpation of the anterior left knee.  No palpable effusion or edema noted.  No bony deformity or high riding patella.  Small abrasion noted at the distal knee.  Lymphadenopathy:     Cervical: No cervical adenopathy.  Skin:    General: Skin is warm.     Capillary Refill: Capillary refill takes less than 2 seconds.  Neurological:     General: No focal deficit present.     Mental Status: He is alert and oriented to person, place, and time.     Sensory: No sensory deficit.     Motor: No weakness.    ED Results / Procedures / Treatments   Labs (all labs ordered are listed, but only abnormal results are displayed) Labs Reviewed - No data to display  EKG None  Radiology CT Head Wo Contrast  Result Date: 02/01/2021 CLINICAL DATA:  Head trauma, minor (Age >= 65y).  Status post fall EXAM: CT HEAD WITHOUT CONTRAST TECHNIQUE: Contiguous axial images were obtained from the base of the skull through the vertex without intravenous contrast. COMPARISON:  CT head 07/04/2017, MR head 07/05/2017 BRAIN: BRAIN Prominence of the lateral ventricles may be related to central predominant atrophy, although a component of normal pressure/communicating hydrocephalus cannot be excluded. patchy and confluent areas of decreased attenuation are noted throughout the deep and periventricular white matter of the  cerebral hemispheres bilaterally, compatible with chronic microvascular ischemic disease. No evidence of large-territorial acute infarction. No parenchymal hemorrhage. No mass lesion. No extra-axial collection. No mass effect or midline shift. No hydrocephalus. Basilar cisterns are patent. Vascular: No hyperdense vessel. Skull: No acute fracture or focal lesion. Sinuses/Orbits: Paranasal sinuses and mastoid air cells are clear. The orbits are unremarkable. Other: None. IMPRESSION: 1. No acute intracranial abnormality. 2. Prominence of the lateral ventricles may be related to central predominant atrophy, although a component of normal pressure/communicating hydrocephalus cannot be excluded. Electronically Signed   By: Iven Finn M.D.   On: 02/01/2021 17:41   DG Knee Complete 4 Views Left  Result Date: 02/01/2021 CLINICAL DATA:  Fall.  Left knee pain. EXAM: LEFT KNEE - COMPLETE 4+ VIEW COMPARISON:  None. FINDINGS: The mineralization and alignment are normal. There is no evidence of acute fracture or dislocation. The joint spaces are relatively preserved, although there is chondrocalcinosis of both menisci and small joint effusion. There is mild spurring at the quadriceps insertion on the patella. IMPRESSION: No acute osseous findings or significant arthropathic changes. Meniscal chondrocalcinosis and small joint effusion. Electronically Signed   By: Richardean Sale M.D.   On: 02/01/2021 18:04   DG HIP UNILAT WITH PELVIS 2-3 VIEWS LEFT  Result Date: 02/01/2021 CLINICAL DATA:  Pain after fall today EXAM: DG HIP (WITH OR WITHOUT PELVIS) 2-3V LEFT COMPARISON:  04/08/2013 FINDINGS: Acute left femoral neck fracture with mild displacement. Femoral head projects in joint. IMPRESSION: Acute mildly displaced left femoral neck fracture Electronically Signed   By: Donavan Foil M.D.   On: 02/01/2021 19:36    Procedures Procedures   Medications Ordered in ED Medications - No data to display  ED Course  I have  reviewed the triage vital signs and the nursing notes.  Pertinent labs & imaging results that were available during my care of the patient were reviewed by me and considered in my medical decision making (see chart for details).    MDM Rules/Calculators/A&P  Patient here for evaluation of left knee pain secondary to mechanical fall that occurred earlier today.  On exam, patient has diffuse tenderness to the anterior knee.  No bony deformities, edema or effusion.  Mild tenderness with valgus and varus stress.no obvious ligamentous instability.  Small abrasion noted to the distal knee.  No edema or active bleeding. Will obtain x-ray imaging of the knee, given patient's age and questionable head injury will also CT head.  On recheck, patient resting comfortably.  No acute distress.  Vital signs reassuring.  X-rays of the knee without acute bony injury.  CT head does show some mild fluid collection of the lateral ventricles which may be secondary to atrophy or component of pressure hydrocephalus.  Doubt emergent process. Discussed CT findings with Dr. Ashok Cordia, patient appropriate for discharge back to facility, knee brace applied and plan for outpatient follow-up with neurology.  At time of disposition, patient now complaining of pain to his left hip area.  He does have pain with rotation, but describing pain to distal thigh.  X-ray of the hip ordered. X-ray shows mildly displaced femoral neck fracture.  Will consult orthopedics, Dr. Aline Brochure.  Discussed findings with Dr. Aline Brochure, he requests hospitalist admission and he will plan for surgery on Friday  Awaiting hospitalist consult and end of shift, discussed findings with Evalee Jefferson, PA-C who agrees to discuss care plan with hospitalist  Final Clinical Impression(s) / ED Diagnoses Final diagnoses:  Fall, initial encounter  Contusion of left knee, initial encounter  Closed fracture of neck of left femur, initial  encounter [facility])    Rx / DC Orders ED Discharge Orders     None        Kem Parkinson, PA-C 02/01/21 1911    Kem Parkinson, PA-C 02/01/21 2018    Lajean Saver, MD 02/06/21 1558

## 2021-02-02 ENCOUNTER — Encounter (HOSPITAL_COMMUNITY): Payer: Self-pay | Admitting: Family Medicine

## 2021-02-02 DIAGNOSIS — S72002A Fracture of unspecified part of neck of left femur, initial encounter for closed fracture: Principal | ICD-10-CM

## 2021-02-02 DIAGNOSIS — Z794 Long term (current) use of insulin: Secondary | ICD-10-CM

## 2021-02-02 DIAGNOSIS — E1122 Type 2 diabetes mellitus with diabetic chronic kidney disease: Secondary | ICD-10-CM

## 2021-02-02 DIAGNOSIS — N1832 Chronic kidney disease, stage 3b: Secondary | ICD-10-CM

## 2021-02-02 DIAGNOSIS — E785 Hyperlipidemia, unspecified: Secondary | ICD-10-CM

## 2021-02-02 DIAGNOSIS — J449 Chronic obstructive pulmonary disease, unspecified: Secondary | ICD-10-CM

## 2021-02-02 LAB — CBC WITH DIFFERENTIAL/PLATELET
Abs Immature Granulocytes: 0.04 10*3/uL (ref 0.00–0.07)
Basophils Absolute: 0.1 10*3/uL (ref 0.0–0.1)
Basophils Relative: 1 %
Eosinophils Absolute: 0.5 10*3/uL (ref 0.0–0.5)
Eosinophils Relative: 4 %
HCT: 36.7 % — ABNORMAL LOW (ref 39.0–52.0)
Hemoglobin: 11.9 g/dL — ABNORMAL LOW (ref 13.0–17.0)
Immature Granulocytes: 0 %
Lymphocytes Relative: 12 %
Lymphs Abs: 1.3 10*3/uL (ref 0.7–4.0)
MCH: 30.2 pg (ref 26.0–34.0)
MCHC: 32.4 g/dL (ref 30.0–36.0)
MCV: 93.1 fL (ref 80.0–100.0)
Monocytes Absolute: 0.7 10*3/uL (ref 0.1–1.0)
Monocytes Relative: 7 %
Neutro Abs: 7.9 10*3/uL — ABNORMAL HIGH (ref 1.7–7.7)
Neutrophils Relative %: 76 %
Platelets: 213 10*3/uL (ref 150–400)
RBC: 3.94 MIL/uL — ABNORMAL LOW (ref 4.22–5.81)
RDW: 13.3 % (ref 11.5–15.5)
WBC: 10.5 10*3/uL (ref 4.0–10.5)
nRBC: 0 % (ref 0.0–0.2)

## 2021-02-02 LAB — COMPREHENSIVE METABOLIC PANEL
ALT: 19 U/L (ref 0–44)
AST: 16 U/L (ref 15–41)
Albumin: 3.9 g/dL (ref 3.5–5.0)
Alkaline Phosphatase: 38 U/L (ref 38–126)
Anion gap: 10 (ref 5–15)
BUN: 30 mg/dL — ABNORMAL HIGH (ref 8–23)
CO2: 27 mmol/L (ref 22–32)
Calcium: 9.4 mg/dL (ref 8.9–10.3)
Chloride: 103 mmol/L (ref 98–111)
Creatinine, Ser: 1.47 mg/dL — ABNORMAL HIGH (ref 0.61–1.24)
GFR, Estimated: 50 mL/min — ABNORMAL LOW (ref >60)
Glucose, Bld: 183 mg/dL — ABNORMAL HIGH (ref 70–99)
Potassium: 4.8 mmol/L (ref 3.5–5.1)
Sodium: 140 mmol/L (ref 135–145)
Total Bilirubin: 0.6 mg/dL (ref 0.3–1.2)
Total Protein: 6.7 g/dL (ref 6.5–8.1)

## 2021-02-02 LAB — SURGICAL PCR SCREEN
MRSA, PCR: NEGATIVE
Staphylococcus aureus: POSITIVE — AB

## 2021-02-02 LAB — HEMOGLOBIN A1C
Hgb A1c MFr Bld: 6.5 % — ABNORMAL HIGH (ref 4.8–5.6)
Mean Plasma Glucose: 139.85 mg/dL

## 2021-02-02 LAB — GLUCOSE, CAPILLARY
Glucose-Capillary: 169 mg/dL — ABNORMAL HIGH (ref 70–99)
Glucose-Capillary: 154 mg/dL — ABNORMAL HIGH (ref 70–99)
Glucose-Capillary: 174 mg/dL — ABNORMAL HIGH (ref 70–99)
Glucose-Capillary: 293 mg/dL — ABNORMAL HIGH (ref 70–99)

## 2021-02-02 LAB — ABO/RH: ABO/RH(D): B POS

## 2021-02-02 LAB — TYPE AND SCREEN
ABO/RH(D): B POS
Antibody Screen: NEGATIVE

## 2021-02-02 MED ORDER — LAMOTRIGINE 100 MG PO TABS
100.0000 mg | ORAL_TABLET | Freq: Every day | ORAL | Status: DC
  Administered 2021-02-04 – 2021-02-07 (×4): 100 mg via ORAL
  Filled 2021-02-02 (×6): qty 1

## 2021-02-02 MED ORDER — CITALOPRAM HYDROBROMIDE 20 MG PO TABS
20.0000 mg | ORAL_TABLET | Freq: Every day | ORAL | Status: DC
  Administered 2021-02-02 – 2021-02-06 (×5): 20 mg via ORAL
  Filled 2021-02-02 (×5): qty 1

## 2021-02-02 MED ORDER — CEFAZOLIN SODIUM-DEXTROSE 2-4 GM/100ML-% IV SOLN
2.0000 g | INTRAVENOUS | Status: AC
  Administered 2021-02-03: 2 g via INTRAVENOUS
  Filled 2021-02-02 (×2): qty 100

## 2021-02-02 MED ORDER — ACETAMINOPHEN 325 MG PO TABS
650.0000 mg | ORAL_TABLET | Freq: Four times a day (QID) | ORAL | Status: DC | PRN
  Administered 2021-02-03 – 2021-02-04 (×2): 650 mg via ORAL
  Filled 2021-02-02 (×2): qty 2

## 2021-02-02 MED ORDER — CHLORHEXIDINE GLUCONATE CLOTH 2 % EX PADS
6.0000 | MEDICATED_PAD | Freq: Every day | CUTANEOUS | Status: AC
  Administered 2021-02-02 – 2021-02-06 (×5): 6 via TOPICAL

## 2021-02-02 MED ORDER — POVIDONE-IODINE 10 % EX SWAB: 2.0000 "application " | Freq: Once | CUTANEOUS | Status: DC

## 2021-02-02 MED ORDER — CHLORHEXIDINE GLUCONATE 4 % EX LIQD
60.0000 mL | Freq: Once | CUTANEOUS | Status: DC
  Filled 2021-02-02: qty 15

## 2021-02-02 MED ORDER — ASPIRIN EC 81 MG PO TBEC
81.0000 mg | DELAYED_RELEASE_TABLET | Freq: Every day | ORAL | Status: DC
  Administered 2021-02-04: 81 mg via ORAL
  Filled 2021-02-02 (×3): qty 1

## 2021-02-02 MED ORDER — IPRATROPIUM-ALBUTEROL 20-100 MCG/ACT IN AERS: 2.0000 | INHALATION_SPRAY | Freq: Two times a day (BID) | RESPIRATORY_TRACT | Status: DC

## 2021-02-02 MED ORDER — INSULIN DETEMIR 100 UNIT/ML ~~LOC~~ SOLN
50.0000 [IU] | Freq: Every day | SUBCUTANEOUS | Status: DC
  Administered 2021-02-02 – 2021-02-06 (×5): 50 [IU] via SUBCUTANEOUS
  Filled 2021-02-02 (×6): qty 0.5

## 2021-02-02 MED ORDER — TRAZODONE HCL 50 MG PO TABS
100.0000 mg | ORAL_TABLET | Freq: Every day | ORAL | Status: DC
  Administered 2021-02-02 – 2021-02-06 (×5): 100 mg via ORAL
  Filled 2021-02-02 (×5): qty 2

## 2021-02-02 MED ORDER — PANTOPRAZOLE SODIUM 40 MG PO TBEC
40.0000 mg | DELAYED_RELEASE_TABLET | Freq: Every day | ORAL | Status: DC
  Administered 2021-02-04 – 2021-02-07 (×4): 40 mg via ORAL
  Filled 2021-02-02 (×6): qty 1

## 2021-02-02 MED ORDER — INSULIN ASPART 100 UNIT/ML IJ SOLN
0.0000 [IU] | Freq: Every day | INTRAMUSCULAR | Status: DC
Start: 2021-02-02 — End: 2021-02-07
  Administered 2021-02-02: 3 [IU] via SUBCUTANEOUS
  Administered 2021-02-04: 2 [IU] via SUBCUTANEOUS

## 2021-02-02 MED ORDER — ACETAMINOPHEN 650 MG RE SUPP
650.0000 mg | Freq: Four times a day (QID) | RECTAL | Status: DC | PRN
  Administered 2021-02-05: 650 mg via RECTAL
  Filled 2021-02-02: qty 1

## 2021-02-02 MED ORDER — MORPHINE SULFATE (PF) 2 MG/ML IV SOLN
2.0000 mg | INTRAVENOUS | Status: DC | PRN
  Administered 2021-02-02: 2 mg via INTRAVENOUS
  Filled 2021-02-02: qty 1

## 2021-02-02 MED ORDER — LISINOPRIL 5 MG PO TABS
2.5000 mg | ORAL_TABLET | Freq: Every day | ORAL | Status: DC
  Administered 2021-02-04: 2.5 mg via ORAL
  Filled 2021-02-02 (×3): qty 1

## 2021-02-02 MED ORDER — INSULIN ASPART 100 UNIT/ML IJ SOLN
0.0000 [IU] | Freq: Three times a day (TID) | INTRAMUSCULAR | Status: DC
  Administered 2021-02-02 – 2021-02-03 (×4): 4 [IU] via SUBCUTANEOUS
  Administered 2021-02-04: 7 [IU] via SUBCUTANEOUS
  Administered 2021-02-04: 3 [IU] via SUBCUTANEOUS
  Administered 2021-02-04: 4 [IU] via SUBCUTANEOUS
  Administered 2021-02-05 (×2): 7 [IU] via SUBCUTANEOUS
  Administered 2021-02-05 – 2021-02-06 (×2): 4 [IU] via SUBCUTANEOUS
  Administered 2021-02-06: 7 [IU] via SUBCUTANEOUS
  Administered 2021-02-06: 4 [IU] via SUBCUTANEOUS
  Administered 2021-02-07: 3 [IU] via SUBCUTANEOUS
  Administered 2021-02-07: 11 [IU] via SUBCUTANEOUS

## 2021-02-02 MED ORDER — CHLORHEXIDINE GLUCONATE 4 % EX LIQD
60.0000 mL | Freq: Once | CUTANEOUS | Status: AC
  Administered 2021-02-03: 4 via TOPICAL
  Filled 2021-02-02: qty 15

## 2021-02-02 MED ORDER — RISPERIDONE 1 MG PO TABS
0.5000 mg | ORAL_TABLET | Freq: Every day | ORAL | Status: DC
  Administered 2021-02-02 – 2021-02-06 (×5): 0.5 mg via ORAL
  Filled 2021-02-02 (×5): qty 1

## 2021-02-02 MED ORDER — MUPIROCIN 2 % EX OINT
1.0000 "application " | TOPICAL_OINTMENT | Freq: Two times a day (BID) | CUTANEOUS | Status: AC
  Administered 2021-02-02 – 2021-02-06 (×10): 1 via NASAL
  Filled 2021-02-02: qty 22

## 2021-02-02 MED ORDER — ATORVASTATIN CALCIUM 10 MG PO TABS
10.0000 mg | ORAL_TABLET | Freq: Every day | ORAL | Status: DC
  Administered 2021-02-04 – 2021-02-07 (×4): 10 mg via ORAL
  Filled 2021-02-02 (×6): qty 1

## 2021-02-02 MED ORDER — OXYCODONE HCL 5 MG PO TABS: 5.0000 mg | ORAL_TABLET | ORAL | Status: DC | PRN

## 2021-02-02 MED ORDER — ONDANSETRON HCL 4 MG PO TABS: 4.0000 mg | ORAL_TABLET | Freq: Four times a day (QID) | ORAL | Status: DC | PRN

## 2021-02-02 MED ORDER — ONDANSETRON HCL 4 MG/2ML IJ SOLN: 4.0000 mg | Freq: Four times a day (QID) | INTRAMUSCULAR | Status: DC | PRN

## 2021-02-02 MED ORDER — TAMSULOSIN HCL 0.4 MG PO CAPS
0.4000 mg | ORAL_CAPSULE | Freq: Every day | ORAL | Status: DC
  Administered 2021-02-04 – 2021-02-07 (×4): 0.4 mg via ORAL
  Filled 2021-02-02 (×6): qty 1

## 2021-02-02 MED ORDER — IPRATROPIUM-ALBUTEROL 20-100 MCG/ACT IN AERS
2.0000 | INHALATION_SPRAY | Freq: Two times a day (BID) | RESPIRATORY_TRACT | Status: DC
  Administered 2021-02-02 – 2021-02-07 (×10): 2 via RESPIRATORY_TRACT
  Filled 2021-02-02: qty 4

## 2021-02-02 NOTE — Progress Notes (Signed)
Patient complained of quite a bit of pain with gentle movement. He is on schedule for surgery tomorrow, MRSA screening complete, and medicated cream started. He has tolerated sips of liquid fine. Was pulling off his gown and removed his malewick, stated he was trying to get his shoes off. Clarified to patient that he had no shoes, and he calmed down.

## 2021-02-02 NOTE — Consult Note (Addendum)
JANATHAN AMER  02/02/2021  Body mass index is 26.2 kg/m.  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  HENT:  Negative for ear discharge.   Eyes:  Negative for blurred vision.  Respiratory:  Negative for cough.   Cardiovascular:  Negative for chest pain.  Gastrointestinal:  Negative for heartburn.  Genitourinary:  Negative for dysuria.  Musculoskeletal:  Positive for falls and joint pain.  Skin:  Negative for rash.  All other systems reviewed and are negative.   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   History reviewed. No pertinent surgical history.  Social History   Socioeconomic History   Marital status: Single    Spouse name: Not on file   Number of children: Not on file   Years of education: Not on file   Highest education level: Not on file  Occupational History   Not on file  Tobacco Use   Smoking status: Every Day    Types: Pipe   Smokeless tobacco: Never  Substance and Sexual Activity   Alcohol use: No   Drug use: No   Sexual activity: Not on file  Other Topics Concern   Not on file  Social History Narrative   Not on file   Social Determinants of Health   Financial Resource Strain: Not on file  Food Insecurity: Not on file  Transportation Needs: Not on file  Physical Activity: Not on file  Stress: Not on file  Social Connections: Not on file  Intimate Partner Violence: Not on file     Family History  Problem Relation Age  of Onset   Stroke Mother    Diabetes Father       No Known Allergies   Current Facility-Administered Medications:    acetaminophen (TYLENOL) tablet 650 mg, 650 mg, Oral, Q6H PRN **OR** acetaminophen (TYLENOL) suppository 650 mg, 650 mg, Rectal, Q6H PRN, Zierle-Ghosh, Asia B, DO   aspirin EC tablet 81 mg, 81 mg, Oral, Daily, Zierle-Ghosh, Asia B, DO   atorvastatin (LIPITOR) tablet 10 mg, 10 mg, Oral, Daily, Zierle-Ghosh, Asia B, DO   citalopram (CELEXA) tablet 20 mg, 20 mg, Oral, QHS, Zierle-Ghosh, Asia B, DO   insulin aspart (novoLOG) injection 0-20 Units, 0-20 Units, Subcutaneous, TID WC, Zierle-Ghosh, Asia B, DO   insulin aspart (novoLOG) injection 0-5 Units, 0-5 Units, Subcutaneous, QHS, Zierle-Ghosh, Asia B, DO   insulin detemir (LEVEMIR) injection 50 Units, 50 Units, Subcutaneous, QHS, Zierle-Ghosh, Asia B, DO   Ipratropium-Albuterol (COMBIVENT) respimat 2 puff, 2 puff, Inhalation, BID, Zierle-Ghosh, Asia B, DO   lamoTRIgine (LAMICTAL) tablet 100 mg, 100 mg, Oral, Daily, Zierle-Ghosh, Asia B, DO   lisinopril (ZESTRIL) tablet 2.5 mg, 2.5 mg, Oral, Daily, Zierle-Ghosh, Asia B, DO   morphine 2 MG/ML injection 2 mg, 2 mg, Intravenous, Q2H PRN, Zierle-Ghosh, Asia B, DO, 2 mg at 02/02/21 0932   ondansetron (ZOFRAN) tablet 4 mg, 4 mg, Oral, Q6H PRN **OR** ondansetron (ZOFRAN) injection 4  mg, 4 mg, Intravenous, Q6H PRN, Zierle-Ghosh, Asia B, DO   oxyCODONE (Oxy IR/ROXICODONE) immediate release tablet 5 mg, 5 mg, Oral, Q4H PRN, Zierle-Ghosh, Asia B, DO   pantoprazole (PROTONIX) EC tablet 40 mg, 40 mg, Oral, Daily, Zierle-Ghosh, Asia B, DO   risperiDONE (RISPERDAL) tablet 0.5 mg, 0.5 mg, Oral, QHS, Zierle-Ghosh, Asia B, DO   tamsulosin (FLOMAX) capsule 0.4 mg, 0.4 mg, Oral, Daily, Zierle-Ghosh, Asia B, DO   traZODone (DESYREL) tablet 100 mg, 100 mg, Oral, QHS, Zierle-Ghosh, Asia B, DO   PHYSICAL EXAM SECTION: BP (!) 142/71   Pulse 91   Temp 98 F (36.7 C)   Resp 18   Ht 5\' 11"  (1.803  m)   Wt 85.2 kg   SpO2 95%   BMI 26.20 kg/m   Body mass index is 26.2 kg/m.   General appearance: Well-developed well-nourished no gross deformities  Eyes clear normal vision no evidence of conjunctivitis or jaundice, extraocular muscles intact  ENT: ears hearing normal, nasal passages clear, throat clear   Neck is supple without palpable mass, full range of motion   Cardiovascular normal pulse and perfusion in all 4 extremities normal color without edema  Lymph nodes: No lymphadenopathy  Neurologically deep tendon reflexes are equal and normal, no sensation loss or deficits no pathologic reflexes   Skin no lacerations or ulcerations no nodularity no palpable masses, no erythema or nodularity  Psychological: Awake alert and oriented x3 mood and affect normal  Musculoskeletal:   Right lower extrem skin normal no tenderness, normal rom, normal motor, joints show no subluxation  Upper extrems: skin normal no tenderness, normal rom, normal motor, joints show no subluxation  Left lower extrem: skin normal, tenderness prox left hip, rom none, no joint sublux, motor no tremors , leg is ext rotated   Imaging: I personally read the images and my interpretation is  complete fracture left femoral nck  9:33 AM  

## 2021-02-02 NOTE — Progress Notes (Signed)
Patient mobility unable to met due to patient condition. Cannot be moved, turned, or repositioned without severe pain.

## 2021-02-02 NOTE — H&P (View-Only) (Signed)
EVEN POLIO  02/02/2021  Body mass index is 26.2 kg/m.  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  HENT:  Negative for ear discharge.   Eyes:  Negative for blurred vision.  Respiratory:  Negative for cough.   Cardiovascular:  Negative for chest pain.  Gastrointestinal:  Negative for heartburn.  Genitourinary:  Negative for dysuria.  Musculoskeletal:  Positive for falls and joint pain.  Skin:  Negative for rash.  All other systems reviewed and are negative.   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   History reviewed. No pertinent surgical history.  Social History   Socioeconomic History   Marital status: Single    Spouse name: Not on file   Number of children: Not on file   Years of education: Not on file   Highest education level: Not on file  Occupational History   Not on file  Tobacco Use   Smoking status: Every Day    Types: Pipe   Smokeless tobacco: Never  Substance and Sexual Activity   Alcohol use: No   Drug use: No   Sexual activity: Not on file  Other Topics Concern   Not on file  Social History Narrative   Not on file   Social Determinants of Health   Financial Resource Strain: Not on file  Food Insecurity: Not on file  Transportation Needs: Not on file  Physical Activity: Not on file  Stress: Not on file  Social Connections: Not on file  Intimate Partner Violence: Not on file     Family History  Problem Relation Age  of Onset   Stroke Mother    Diabetes Father       No Known Allergies   Current Facility-Administered Medications:    acetaminophen (TYLENOL) tablet 650 mg, 650 mg, Oral, Q6H PRN **OR** acetaminophen (TYLENOL) suppository 650 mg, 650 mg, Rectal, Q6H PRN, Zierle-Ghosh, Asia B, DO   aspirin EC tablet 81 mg, 81 mg, Oral, Daily, Zierle-Ghosh, Asia B, DO   atorvastatin (LIPITOR) tablet 10 mg, 10 mg, Oral, Daily, Zierle-Ghosh, Asia B, DO   citalopram (CELEXA) tablet 20 mg, 20 mg, Oral, QHS, Zierle-Ghosh, Asia B, DO   insulin aspart (novoLOG) injection 0-20 Units, 0-20 Units, Subcutaneous, TID WC, Zierle-Ghosh, Asia B, DO   insulin aspart (novoLOG) injection 0-5 Units, 0-5 Units, Subcutaneous, QHS, Zierle-Ghosh, Asia B, DO   insulin detemir (LEVEMIR) injection 50 Units, 50 Units, Subcutaneous, QHS, Zierle-Ghosh, Asia B, DO   Ipratropium-Albuterol (COMBIVENT) respimat 2 puff, 2 puff, Inhalation, BID, Zierle-Ghosh, Asia B, DO   lamoTRIgine (LAMICTAL) tablet 100 mg, 100 mg, Oral, Daily, Zierle-Ghosh, Asia B, DO   lisinopril (ZESTRIL) tablet 2.5 mg, 2.5 mg, Oral, Daily, Zierle-Ghosh, Asia B, DO   morphine 2 MG/ML injection 2 mg, 2 mg, Intravenous, Q2H PRN, Zierle-Ghosh, Asia B, DO, 2 mg at 02/02/21 0932   ondansetron (ZOFRAN) tablet 4 mg, 4 mg, Oral, Q6H PRN **OR** ondansetron (ZOFRAN) injection 4  mg, 4 mg, Intravenous, Q6H PRN, Zierle-Ghosh, Asia B, DO   oxyCODONE (Oxy IR/ROXICODONE) immediate release tablet 5 mg, 5 mg, Oral, Q4H PRN, Zierle-Ghosh, Asia B, DO   pantoprazole (PROTONIX) EC tablet 40 mg, 40 mg, Oral, Daily, Zierle-Ghosh, Asia B, DO   risperiDONE (RISPERDAL) tablet 0.5 mg, 0.5 mg, Oral, QHS, Zierle-Ghosh, Asia B, DO   tamsulosin (FLOMAX) capsule 0.4 mg, 0.4 mg, Oral, Daily, Zierle-Ghosh, Asia B, DO   traZODone (DESYREL) tablet 100 mg, 100 mg, Oral, QHS, Zierle-Ghosh, Asia B, DO   PHYSICAL EXAM SECTION: BP (!) 142/71   Pulse 91   Temp 98 F (36.7 C)   Resp 18   Ht 5\' 11"  (1.803  m)   Wt 85.2 kg   SpO2 95%   BMI 26.20 kg/m   Body mass index is 26.2 kg/m.   General appearance: Well-developed well-nourished no gross deformities  Eyes clear normal vision no evidence of conjunctivitis or jaundice, extraocular muscles intact  ENT: ears hearing normal, nasal passages clear, throat clear   Neck is supple without palpable mass, full range of motion   Cardiovascular normal pulse and perfusion in all 4 extremities normal color without edema  Lymph nodes: No lymphadenopathy  Neurologically deep tendon reflexes are equal and normal, no sensation loss or deficits no pathologic reflexes   Skin no lacerations or ulcerations no nodularity no palpable masses, no erythema or nodularity  Psychological: Awake alert and oriented x3 mood and affect normal  Musculoskeletal:   Right lower extrem skin normal no tenderness, normal rom, normal motor, joints show no subluxation  Upper extrems: skin normal no tenderness, normal rom, normal motor, joints show no subluxation  Left lower extrem: skin normal, tenderness prox left hip, rom none, no joint sublux, motor no tremors , leg is ext rotated   Imaging: I personally read the images and my interpretation is  complete fracture left femoral nck  9:33 AM  

## 2021-02-02 NOTE — Plan of Care (Signed)

## 2021-02-02 NOTE — H&P (Signed)
TRH H&P    Patient Demographics:    Douglas Cook, is a 75 y.o. male  MRN: [id number]  DOB - Aug 14, 1945  Admit Date - 02/01/2021  Referring MD/NP/PA: Ashok Cordia  Outpatient Primary MD for the patient is Glenda Chroman, MD  Patient coming from: SNF  Chief complaint-knee pain   HPI:    Douglas Cook  is a 75 y.o. male, with history of anemia of chronic disease, chronic kidney disease stage III, COPD, diabetes mellitus, hyperlipidemia, reflux, and more presents the ED with a chief complaint of knee pain.  Patient is coming from a skilled nursing facility.  They had a fire drill today, and he was outside trying to move objects out of the way, when he tripped and fell and landed on his left knee.  Patient has a difficult time remembering what happened, this is likely his baseline.  At first patient said he could not really tell me why he was here, and when he was reminded that he fell he was able to provide some of the details.  Patient reports that prior to falling he had no palpitations, no chest pain, no dizziness.  He did feel short of breath but attributes that to exerting himself.  He did not hit his head.  He thinks he did pass out, since he cannot remember the event very well.  The way he describes it though, he was aware of everything going around him the whole time.  Patient reports that he initially did not feel the knee pain that was sharp.  Rest improved it, attempting to weight-bear made it worse.  Left knee scans did not show any acute abnormalities.  As he was being discharged he mentioned left hip pain.  Imaging of left hip pain shows left old fracture.  Patient does not smoke, does not drink, does not use illicit drugs.  He is vaccinated for COVID.  There is no DNR form at bedside, given patient's memory concerns he would not be advisable to have a conversation now -if no family at bedside  In the ED Temp  98.1, heart rate 81-86, respiratory rate 16-19, blood pressure 132/66, satting at 98% EKG shows a heart rate 83, sinus rhythm, QTC 439 Leukocytosis 11.3, hemoglobin 11.4 Chemistry panel shows a BUN 35, creatinine 1.64 Left hip x-ray shows left femoral neck fracture Left knee x-ray shows small joint effusion CT head shows no acute abnormality, but it does reveal nonspecific prominence of the lateral ventricles Patient was given Tylenol, Tdap vaccine, fentanyl Orthopedic surgery was consulted recommended n.p.o. after midnight and they will see him in the a.m. Admission requested for management of acute hip fracture    Review of systems:     Patient does answer these questions, but given the difficulty with his memory regarding the fall from the accuracy of the review of systems is not certain  In addition to the HPI above,  No Fever-chills, No Headache, No changes with Vision or hearing, No problems swallowing food or Liquids, No Chest pain, Cough  No Abdominal pain,  No Nausea or Vomiting, bowel movements are regular, No Blood in stool or Urine, No dysuria, No new skin rashes or bruises, No new weakness, tingling, numbness in any extremity, No recent weight gain or loss, No polyuria, polydypsia or polyphagia, No significant Mental Stressors.  All other systems reviewed and are negative.    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    Social History:      Social History   Tobacco Use   Smoking status: Every Day    Types: Pipe   Smokeless tobacco: Never  Substance Use Topics   Alcohol use: No       Family  History :     Family History  Problem Relation Age of Onset   Stroke Mother    Diabetes Father       Home Medications:   Prior to Admission medications   Medication Sig Start Date End Date Taking? Authorizing Provider  Alpha-Lipoic Acid 100 MG CAPS Take 3 capsules by mouth every evening. 01/03/21  Yes [provider]  aspirin EC 81 MG tablet Take 81 mg by mouth daily.   Yes [provider]  atorvastatin (LIPITOR) 20 MG tablet Take 20 mg by mouth daily.   Yes [provider]  cetirizine (ZYRTEC) 10 MG tablet Take 10 mg by mouth at bedtime.   Yes [provider]  cholecalciferol (VITAMIN D) 1000 UNITS tablet Take 1,000 Units by mouth daily.   Yes [provider]  citalopram (CELEXA) 20 MG tablet Take 20 mg by mouth at bedtime.   Yes [provider]  COMBIVENT RESPIMAT 20-100 MCG/ACT AERS respimat Inhale 2 puffs into the lungs 2 (two) times daily. 02/11/13  Yes [provider]  FEROSUL 325 (65 Fe) MG tablet Take 1 tablet by mouth 2 (two) times daily. 01/03/21  Yes [provider]  fluticasone (FLONASE) 50 MCG/ACT nasal spray Place 1 spray into both nostrils daily. 03/02/13  Yes [provider]  lamoTRIgine (LAMICTAL) 100 MG tablet Take 1 tablet by mouth daily. 04/08/13  Yes [provider]  LEVEMIR FLEXTOUCH 100 UNIT/ML FlexTouch Pen Inject 26 Units into the skin at bedtime. 01/26/21  Yes [provider]  lisinopril (PRINIVIL,ZESTRIL) 2.5 MG tablet Take 2.5 mg by mouth daily.   Yes [provider]  metFORMIN (GLUCOPHAGE-XR) 500 MG 24 hr tablet Take 1,000 mg by mouth 2 (two) times daily. 01/03/21  Yes [provider]  NOVOLOG FLEXPEN 100 UNIT/ML FlexPen Inject 12 Units into the skin 3 (three) times daily. Hold if blood sugar is less than 60 01/26/21  Yes [provider]  omeprazole (PRILOSEC) 20 MG capsule Take 1 capsule by mouth daily. 04/08/13  Yes [provider]  Propylene Glycol (SYSTANE BALANCE OP) Apply 1 drop to eye 3 (three) times daily. 1 gtt in each eye BID   Yes [provider]  risperiDONE (RISPERDAL) 0.5 MG tablet Take 0.5 mg by mouth at bedtime.   Yes [provider]  tamsulosin (FLOMAX) 0.4 MG CAPS capsule Take 1 capsule  by mouth daily. 04/08/13  Yes [provider]  TRADJENTA 5 MG TABS tablet Take 5 mg by mouth daily. 01/03/21  Yes [provider]  traZODone (DESYREL) 100 MG tablet Take 1 tablet by mouth at bedtime. 04/08/13  Yes [provider]  Insulin Glargine 300 UNIT/ML SOPN Inject 76 Units into the skin at bedtime. Patient not taking: Reported on 02/01/2021    [provider]  insulin lispro (HUMALOG) 100 UNIT/ML injection Inject 15 Units into the skin 3 (three) times daily with meals.  Patient not taking: Reported on 02/01/2021    [provider]  metFORMIN (GLUCOPHAGE) 500 MG tablet Take 1,000 mg by mouth 2 (two) times daily with a meal. Patient not taking: Reported on 02/01/2021    [provider]  ONGLYZA 5 MG TABS tablet Take 1 tablet by mouth daily. Patient not taking: Reported on 02/01/2021 04/08/13   [provider]     Allergies:    No Known Allergies   Physical Exam:   Vitals  Blood pressure (!) 121/54, pulse 89, temperature 99.8 F (37.7 C), temperature source Oral, resp. rate (!) 21, height 5\' 11"  (1.803 m), weight 85.2 kg, SpO2 94 %.   1.  General: Patient lying supine in bed,  no acute distress   2. Psychiatric: Alert and oriented x 3, mood and behavior normal for situation, pleasant and cooperative with exam   3. Neurologic: Speech and language are normal, face is symmetric, moves all 4 extremities voluntarily, at baseline without acute deficits on limited exam, bilateral sensation in the lower extremities is normal   4. HEENMT:  Head is atraumatic, normocephalic, pupils reactive to light, neck is supple, trachea is midline,  mucous membranes are moist   5. Respiratory : Lungs are clear to auscultation bilaterally without wheezing, rhonchi, rales, no cyanosis, no increase in work of breathing or accessory muscle use   6. Cardiovascular : Heart rate normal, rhythm is regular, no murmurs, rubs or gallops, no peripheral edema, peripheral pulses palpated   7. Gastrointestinal:  Abdomen is soft, nondistended, nontender to palpation bowel sounds active, no masses or organomegaly palpated   8. Skin:  Skin is warm, dry and intact without rashes, acute lesions, or ulcers on limited exam   9.Musculoskeletal:  Laying with right leg crossed over left leg.  Neurovascularly intact left lower extremity. No acute deformities or trauma, no asymmetry in tone, no peripheral edema, peripheral pulses palpated, no tenderness to palpation in the extremities     Data Review:    CBC Recent Labs  Lab 02/01/21 2001  WBC 11.3*  HGB 11.4*  HCT 34.3*  PLT 211  MCV 93.5  MCH 31.1  MCHC 33.2  RDW 13.6  LYMPHSABS 1.7  MONOABS 0.9  EOSABS 1.1*  BASOSABS 0.1   ------------------------------------------------------------------------------------------------------------------  Results for orders placed or performed during the hospital encounter of 02/01/21 (from the past 48 hour(s))  Basic metabolic panel     Status: Abnormal   Collection Time: 02/01/21  8:01 PM  Result Value Ref Range   Sodium 139 135 - 145 mmol/L   Potassium 4.7 3.5 - 5.1 mmol/L   Chloride 104 98 - 111 mmol/L   CO2 26 22 - 32 mmol/L   Glucose, Bld 147 (H) 70 - 99 mg/dL    Comment: Glucose reference range applies only to samples taken after fasting for at least 8 hours.   BUN 35 (H) 8 - 23 mg/dL   Creatinine, Ser 1.611.64 (H) 0.61 -  1.24 mg/dL   Calcium 9.4 8.9 - 10.3 mg/dL   GFR, Estimated 44 (L) >60 mL/min    Comment: (NOTE) Calculated using the CKD-EPI Creatinine Equation (2021)    Anion gap 9 5 - 15    Comment: Performed at [facility], [address] [place], [place] [address]  CBC with Differential     Status: Abnormal   Collection Time: 02/01/21  8:01 PM  Result Value Ref Range   WBC 11.3 (H) 4.0 - 10.5 K/uL   RBC 3.67 (L) 4.22 - 5.81 MIL/uL   Hemoglobin 11.4 (L) 13.0 - 17.0 g/dL   HCT 34.3 (L) 39.0 - 52.0 %   MCV 93.5 80.0 - 100.0 fL   MCH 31.1 26.0 - 34.0 pg   MCHC 33.2 30.0 - 36.0 g/dL   RDW 13.6 11.5 - 15.5 %   Platelets 211 150 - 400 K/uL   nRBC 0.0 0.0 - 0.2 %   Neutrophils Relative % 66 %   Neutro Abs 7.5 1.7 - 7.7 K/uL   Lymphocytes Relative 15 %   Lymphs Abs 1.7 0.7 - 4.0 K/uL   Monocytes Relative 8 %   Monocytes Absolute 0.9 0.1 - 1.0 K/uL   Eosinophils Relative 10 %   Eosinophils Absolute 1.1 (H) 0.0 - 0.5 K/uL   Basophils Relative 1 %   Basophils Absolute 0.1 0.0 - 0.1 K/uL   Immature Granulocytes 0 %   Abs Immature Granulocytes 0.03 0.00 - 0.07 K/uL    Comment: Performed at [facility], [address] [place], [place] [address]  Resp Panel by RT-PCR (Flu A&B, Covid) Nasopharyngeal Swab     Status: None   Collection Time: 02/01/21  8:14 PM   Specimen: Nasopharyngeal Swab; Nasopharyngeal(NP) swabs in vial transport medium  Result Value Ref Range   SARS Coronavirus 2 by RT PCR NEGATIVE NEGATIVE    Comment: (NOTE) SARS-CoV-2 target nucleic acids are NOT DETECTED.  The SARS-CoV-2 RNA is generally detectable in upper respiratory specimens during the acute phase of infection. The lowest concentration of SARS-CoV-2 viral copies this assay can detect is 138 copies/mL. A negative result does not preclude SARS-Cov-2 infection and should not be used as the sole basis for treatment or other patient management decisions. A negative result may occur with  improper specimen collection/handling, submission of specimen other than nasopharyngeal swab, presence of viral mutation(s) within the areas targeted by this assay, and inadequate number of viral copies(<138 copies/mL). A negative result must be combined  with clinical observations, patient history, and epidemiological information. The expected result is Negative.  Fact Sheet for Patients:  EntrepreneurPulse.com.au  Fact Sheet for Healthcare Providers:  IncredibleEmployment.be  This test is no t yet approved or cleared by the [place] FDA and  has been authorized for detection and/or diagnosis of SARS-CoV-2 by FDA under an Emergency Use Authorization (EUA). This EUA will remain  in effect (meaning this test can be used) for the duration of the COVID-19 declaration under Section 564(b)(1) of the Act, 21 U.S.C.section 360bbb-3(b)(1), unless the authorization is terminated  or revoked sooner.       Influenza A by PCR NEGATIVE NEGATIVE   Influenza B by PCR NEGATIVE NEGATIVE    Comment: (NOTE) The Xpert Xpress SARS-CoV-2/FLU/RSV plus assay is intended as an aid in the diagnosis of influenza from Nasopharyngeal swab specimens and should not be used as a sole basis for treatment. Nasal washings and aspirates are unacceptable for Xpert Xpress SARS-CoV-2/FLU/RSV testing.  Fact Sheet for  Patients: BloggerCourse.com  Fact Sheet for Healthcare Providers: SeriousBroker.it  This test is not yet approved or cleared by the [place] FDA and has been authorized for detection and/or diagnosis of SARS-CoV-2 by FDA under an Emergency Use Authorization (EUA). This EUA will remain in effect (meaning this test can be used) for the duration of the COVID-19 declaration under Section 564(b)(1) of the Act, 21 U.S.C. section 360bbb-3(b)(1), unless the authorization is terminated or revoked.  Performed at [facility], [address] [place], [place] [address]     Chemistries  Recent Labs  Lab 02/01/21 2001  NA 139  K 4.7  CL 104  CO2 26  GLUCOSE 147*  BUN 35*  CREATININE 1.64*  CALCIUM 9.4    ------------------------------------------------------------------------------------------------------------------  ------------------------------------------------------------------------------------------------------------------ GFR: Estimated Creatinine Clearance: 42.7 mL/min (A) (by C-G formula based on SCr of 1.64 mg/dL (H)). Liver Function Tests: No results for input(s): AST, ALT, ALKPHOS, BILITOT, PROT, ALBUMIN in the last 168 hours. No results for input(s): LIPASE, AMYLASE in the last 168 hours. No results for input(s): AMMONIA in the last 168 hours. Coagulation Profile: No results for input(s): INR, PROTIME in the last 168 hours. Cardiac Enzymes: No results for input(s): CKTOTAL, CKMB, CKMBINDEX, TROPONINI in the last 168 hours. BNP (last 3 results) No results for input(s): PROBNP in the last 8760 hours. HbA1C: No results for input(s): HGBA1C in the last 72 hours. CBG: No results for input(s): GLUCAP in the last 168 hours. Lipid Profile: No results for input(s): CHOL, HDL, LDLCALC, TRIG, CHOLHDL, LDLDIRECT in the last 72 hours. Thyroid Function Tests: No results for input(s): TSH, T4TOTAL, FREET4, T3FREE, THYROIDAB in the last 72 hours. Anemia Panel: No results for input(s): VITAMINB12, FOLATE, FERRITIN, TIBC, IRON, RETICCTPCT in the last 72 hours.  --------------------------------------------------------------------------------------------------------------- Urine analysis:    Component Value Date/Time   COLORURINE STRAW (A) 07/05/2017 0524   APPEARANCEUR CLEAR 07/05/2017 0524   LABSPEC 1.009 07/05/2017 0524   PHURINE 5.0 07/05/2017 0524   GLUCOSEU NEGATIVE 07/05/2017 0524   HGBUR NEGATIVE 07/05/2017 0524   BILIRUBINUR NEGATIVE 07/05/2017 0524   KETONESUR NEGATIVE 07/05/2017 0524   PROTEINUR NEGATIVE 07/05/2017 0524   NITRITE NEGATIVE 07/05/2017 0524   LEUKOCYTESUR NEGATIVE 07/05/2017 0524      Imaging Results:    CT Head Wo Contrast  Result Date:  02/01/2021 CLINICAL DATA:  Head trauma, minor (Age >= 65y).  Status post fall EXAM: CT HEAD WITHOUT CONTRAST TECHNIQUE: Contiguous axial images were obtained from the base of the skull through the vertex without intravenous contrast. COMPARISON:  CT head 07/04/2017, MR head 07/05/2017 BRAIN: BRAIN Prominence of the lateral ventricles may be related to central predominant atrophy, although a component of normal pressure/communicating hydrocephalus cannot be excluded. patchy and confluent areas of decreased attenuation are noted throughout the deep and periventricular white matter of the cerebral hemispheres bilaterally, compatible with chronic microvascular ischemic disease. No evidence of large-territorial acute infarction. No parenchymal hemorrhage. No mass lesion. No extra-axial collection. No mass effect or midline shift. No hydrocephalus. Basilar cisterns are patent. Vascular: No hyperdense vessel. Skull: No acute fracture or focal lesion. Sinuses/Orbits: Paranasal sinuses and mastoid air cells are clear. The orbits are unremarkable. Other: None. IMPRESSION: 1. No acute intracranial abnormality. 2. Prominence of the lateral ventricles may be related to central predominant atrophy, although a component of normal pressure/communicating hydrocephalus cannot be excluded. Electronically Signed   By: Tish Frederickson M.D.   On: 02/01/2021 17:41   DG Knee Complete 4 Views Left  Result Date:  02/01/2021 CLINICAL DATA:  Fall.  Left knee pain. EXAM: LEFT KNEE - COMPLETE 4+ VIEW COMPARISON:  None. FINDINGS: The mineralization and alignment are normal. There is no evidence of acute fracture or dislocation. The joint spaces are relatively preserved, although there is chondrocalcinosis of both menisci and small joint effusion. There is mild spurring at the quadriceps insertion on the patella. IMPRESSION: No acute osseous findings or significant arthropathic changes. Meniscal chondrocalcinosis and small joint effusion.  Electronically Signed   By: Richardean Sale M.D.   On: 02/01/2021 18:04   DG HIP UNILAT WITH PELVIS 2-3 VIEWS LEFT  Result Date: 02/01/2021 CLINICAL DATA:  Pain after fall today EXAM: DG HIP (WITH OR WITHOUT PELVIS) 2-3V LEFT COMPARISON:  04/08/2013 FINDINGS: Acute left femoral neck fracture with mild displacement. Femoral head projects in joint. IMPRESSION: Acute mildly displaced left femoral neck fracture Electronically Signed   By: Donavan Foil M.D.   On: 02/01/2021 19:36      Assessment & Plan:    Active Problems:   COPD (chronic obstructive pulmonary disease) (HCC)   Type 2 diabetes mellitus (HCC)   GERD (gastroesophageal reflux disease)   Hyperlipidemia   Hip fracture (HCC)   Left hip fracture Pain control with pain scale N.p.o. after midnight Plan for orthopedic surgery consultation in the a.m. Continue to monitor Diabetes mellitus type 2 Continue reduced dose of basal insulin Continue sliding scale N.p.o. after midnight Continue to monitor COPD Continue home inhalers GERD Continue PPI CKD stage III Creatinine at baseline, GFR 44 Avoid nephrotoxic agents when possible Continue to monitor Hypertension  Continue with lisinopril    DVT Prophylaxis-   holding pharmaceutical anticoagulation until the patient has had surgery, SCDs   AM Labs Ordered, also please review Full Orders  Family Communication: No family at bedside  Code Status: Full  Admission status:Inpatient :The appropriate admission status for this patient is INPATIENT. Inpatient status is judged to be reasonable and necessary in order to provide the required intensity of service to ensure the patient's safety. The patient's presenting symptoms, physical exam findings, and initial radiographic and laboratory data in the context of their chronic comorbidities is felt to place them at high risk for further clinical deterioration. Furthermore, it is not anticipated that the patient will be medically stable  for discharge from the hospital within 2 midnights of admission. The following factors support the admission status of inpatient.     The patient's presenting symptoms include knee pain. The worrisome physical exam findings include hip tenderness,  The initial radiographic and laboratory data are worrisome because of femoral neck fracture. The chronic co-morbidities include diabetes mellitus type 2, COPD, GERD, hyperlipidemia, CKD.       * I certify that at the point of admission it is my clinical judgment that the patient will require inpatient hospital care spanning beyond 2 midnights from the point of admission due to high intensity of service, high risk for further deterioration and high frequency of surveillance required.*  Disposition: Anticipated Discharge 48 hours or more discharge to skilled nursing facility  Time spent in minutes : [place]

## 2021-02-02 NOTE — Progress Notes (Signed)
Patient seen and examined.  Admitted after midnight secondary to mechanical fall at assisted living facility with subsequent left hip pain and inability to bear weight.  Images demonstrating closed left hip fracture.  No chest pain, no nausea, no vomiting, no palpitations, no loss of consciousness or lightheadedness symptoms.  Hemodynamically stable and complaining of pain in his left hip with movement.  MRSA PCR screening negative; positive staphylococcal allergies prior to surgery screening.  Please refer to H&P written by Dr. Carren Rang for further info/details on admission.  Plan: -Surgical repair anticipated on 02/03/2021 by orthopedic surgery -N.p.o. after midnight -Continue supportive care and as needed analgesics. -Physical therapy evaluation after surgery.  Vassie Loll MD [phone]

## 2021-02-03 ENCOUNTER — Inpatient Hospital Stay (HOSPITAL_COMMUNITY): Payer: Medicare Other | Admitting: Anesthesiology

## 2021-02-03 ENCOUNTER — Encounter (HOSPITAL_COMMUNITY): Payer: Self-pay | Admitting: Family Medicine

## 2021-02-03 ENCOUNTER — Inpatient Hospital Stay (HOSPITAL_COMMUNITY): Payer: Medicare Other

## 2021-02-03 ENCOUNTER — Encounter (HOSPITAL_COMMUNITY)
Admission: EM | Disposition: A | Discharge status: Skilled Nursing Facility | Payer: Self-pay | Source: Skilled Nursing Facility | Attending: Internal Medicine

## 2021-02-03 DIAGNOSIS — I10 Essential (primary) hypertension: Secondary | ICD-10-CM

## 2021-02-03 DIAGNOSIS — K219 Gastro-esophageal reflux disease without esophagitis: Secondary | ICD-10-CM

## 2021-02-03 HISTORY — PX: HIP ARTHROPLASTY: SHX981

## 2021-02-03 LAB — GLUCOSE, CAPILLARY
Glucose-Capillary: 179 mg/dL — ABNORMAL HIGH (ref 70–99)
Glucose-Capillary: 165 mg/dL — ABNORMAL HIGH (ref 70–99)
Glucose-Capillary: 180 mg/dL — ABNORMAL HIGH (ref 70–99)

## 2021-02-03 SURGERY — HEMIARTHROPLASTY, HIP, DIRECT ANTERIOR APPROACH, FOR FRACTURE
Anesthesia: Spinal | Site: Hip | Laterality: Left

## 2021-02-03 MED ORDER — TRANEXAMIC ACID-NACL 1000-0.7 MG/100ML-% IV SOLN
INTRAVENOUS | Status: AC
  Filled 2021-02-03: qty 100

## 2021-02-03 MED ORDER — CEFAZOLIN SODIUM-DEXTROSE 2-4 GM/100ML-% IV SOLN
2.0000 g | Freq: Four times a day (QID) | INTRAVENOUS | Status: AC
  Administered 2021-02-03 – 2021-02-04 (×2): 2 g via INTRAVENOUS
  Filled 2021-02-03 (×2): qty 100

## 2021-02-03 MED ORDER — METOCLOPRAMIDE HCL 10 MG PO TABS: 5.0000 mg | ORAL_TABLET | Freq: Three times a day (TID) | ORAL | Status: DC | PRN

## 2021-02-03 MED ORDER — FENTANYL CITRATE (PF) 100 MCG/2ML IJ SOLN
INTRAMUSCULAR | Status: DC | PRN
  Administered 2021-02-03 (×4): 25 ug via INTRAVENOUS

## 2021-02-03 MED ORDER — LACTATED RINGERS IV SOLN: INTRAVENOUS | Status: DC

## 2021-02-03 MED ORDER — LACTATED RINGERS IV BOLUS
1000.0000 mL | Freq: Once | INTRAVENOUS | Status: AC
  Administered 2021-02-03: 1000 mL via INTRAVENOUS

## 2021-02-03 MED ORDER — TRAMADOL HCL 50 MG PO TABS
50.0000 mg | ORAL_TABLET | Freq: Four times a day (QID) | ORAL | Status: DC
  Administered 2021-02-03 – 2021-02-04 (×3): 50 mg via ORAL
  Filled 2021-02-03 (×3): qty 1

## 2021-02-03 MED ORDER — BUPIVACAINE HCL (PF) 0.5 % IJ SOLN
INTRAMUSCULAR | Status: DC | PRN
  Administered 2021-02-03: 2.6 mL via PERINEURAL

## 2021-02-03 MED ORDER — METOCLOPRAMIDE HCL 5 MG/ML IJ SOLN: 5.0000 mg | Freq: Three times a day (TID) | INTRAMUSCULAR | Status: DC | PRN

## 2021-02-03 MED ORDER — TRANEXAMIC ACID-NACL 1000-0.7 MG/100ML-% IV SOLN
1000.0000 mg | Freq: Once | INTRAVENOUS | Status: AC
  Administered 2021-02-03: 1000 mg via INTRAVENOUS
  Filled 2021-02-03: qty 100

## 2021-02-03 MED ORDER — HYDROMORPHONE HCL 1 MG/ML IJ SOLN: 0.2500 mg | INTRAMUSCULAR | Status: DC | PRN

## 2021-02-03 MED ORDER — BUPIVACAINE LIPOSOME 1.3 % IJ SUSP
INTRAMUSCULAR | Status: DC | PRN
  Administered 2021-02-03: 20 mL

## 2021-02-03 MED ORDER — FENTANYL CITRATE (PF) 100 MCG/2ML IJ SOLN
INTRAMUSCULAR | Status: DC | PRN
  Administered 2021-02-03: 20 ug via INTRATHECAL

## 2021-02-03 MED ORDER — ONDANSETRON HCL 4 MG/2ML IJ SOLN: 4.0000 mg | Freq: Once | INTRAMUSCULAR | Status: DC | PRN

## 2021-02-03 MED ORDER — METHOCARBAMOL 500 MG PO TABS
500.0000 mg | ORAL_TABLET | Freq: Four times a day (QID) | ORAL | Status: DC | PRN
  Administered 2021-02-06: 500 mg via ORAL
  Filled 2021-02-03: qty 1

## 2021-02-03 MED ORDER — MENTHOL 3 MG MT LOZG: 1.0000 | LOZENGE | OROMUCOSAL | Status: DC | PRN

## 2021-02-03 MED ORDER — CHLORHEXIDINE GLUCONATE 0.12 % MT SOLN
15.0000 mL | Freq: Once | OROMUCOSAL | Status: AC
  Administered 2021-02-03: 15 mL via OROMUCOSAL

## 2021-02-03 MED ORDER — FENTANYL CITRATE (PF) 100 MCG/2ML IJ SOLN
INTRAMUSCULAR | Status: AC
  Filled 2021-02-03: qty 2

## 2021-02-03 MED ORDER — HYDROCODONE-ACETAMINOPHEN 5-325 MG PO TABS
1.0000 | ORAL_TABLET | ORAL | Status: DC | PRN
  Administered 2021-02-06: 2 via ORAL
  Filled 2021-02-03: qty 2

## 2021-02-03 MED ORDER — TRANEXAMIC ACID-NACL 1000-0.7 MG/100ML-% IV SOLN
INTRAVENOUS | Status: DC | PRN
  Administered 2021-02-03: 1000 mg via INTRAVENOUS

## 2021-02-03 MED ORDER — DOCUSATE SODIUM 100 MG PO CAPS
100.0000 mg | ORAL_CAPSULE | Freq: Two times a day (BID) | ORAL | Status: DC
  Administered 2021-02-03 – 2021-02-07 (×8): 100 mg via ORAL
  Filled 2021-02-03 (×8): qty 1

## 2021-02-03 MED ORDER — BUPIVACAINE HCL (PF) 0.5 % IJ SOLN
INTRAMUSCULAR | Status: AC
  Filled 2021-02-03: qty 30

## 2021-02-03 MED ORDER — SODIUM CHLORIDE 0.9 % IR SOLN
Status: DC | PRN
  Administered 2021-02-03: 3000 mL

## 2021-02-03 MED ORDER — PROPOFOL 500 MG/50ML IV EMUL
INTRAVENOUS | Status: DC | PRN
Start: 2021-02-03 — End: 2021-02-03
  Administered 2021-02-03: 25 ug/kg/min via INTRAVENOUS

## 2021-02-03 MED ORDER — PHENOL 1.4 % MT LIQD: 1.0000 | OROMUCOSAL | Status: DC | PRN

## 2021-02-03 MED ORDER — FENTANYL CITRATE (PF) 100 MCG/2ML IJ SOLN: INTRAMUSCULAR | Status: DC | PRN

## 2021-02-03 MED ORDER — METHOCARBAMOL 1000 MG/10ML IJ SOLN
500.0000 mg | Freq: Four times a day (QID) | INTRAVENOUS | Status: DC | PRN
  Filled 2021-02-03: qty 5

## 2021-02-03 MED ORDER — SENNOSIDES-DOCUSATE SODIUM 8.6-50 MG PO TABS: 1.0000 | ORAL_TABLET | Freq: Every evening | ORAL | Status: DC | PRN

## 2021-02-03 MED ORDER — TRANEXAMIC ACID-NACL 1000-0.7 MG/100ML-% IV SOLN: INTRAVENOUS | Status: DC | PRN

## 2021-02-03 MED ORDER — ORAL CARE MOUTH RINSE: 15.0000 mL | Freq: Once | OROMUCOSAL | Status: AC

## 2021-02-03 MED ORDER — PHENYLEPHRINE HCL (PRESSORS) 10 MG/ML IV SOLN
INTRAVENOUS | Status: DC | PRN
  Administered 2021-02-03: 40 ug via INTRAVENOUS
  Administered 2021-02-03 (×4): 80 ug via INTRAVENOUS

## 2021-02-03 MED ORDER — ALUM & MAG HYDROXIDE-SIMETH 200-200-20 MG/5ML PO SUSP: 30.0000 mL | ORAL | Status: DC | PRN

## 2021-02-03 MED ORDER — 0.9 % SODIUM CHLORIDE (POUR BTL) OPTIME
TOPICAL | Status: DC | PRN
  Administered 2021-02-03: 1000 mL

## 2021-02-03 MED ORDER — PROPOFOL 10 MG/ML IV BOLUS
INTRAVENOUS | Status: DC | PRN
  Administered 2021-02-03 (×7): 20 mg via INTRAVENOUS

## 2021-02-03 MED ORDER — BUPIVACAINE LIPOSOME 1.3 % IJ SUSP
INTRAMUSCULAR | Status: AC
  Filled 2021-02-03: qty 20

## 2021-02-03 SURGICAL SUPPLY — 56 items
APL PRP STRL LF DISP 70% ISPRP (MISCELLANEOUS) ×2
BIPOLAR DEPUY 48 (Hips) ×2 IMPLANT
BIPOLAR DEPUY 48MM (Hips) ×1 IMPLANT
BIT DRILL 2.8X128 (BIT) ×2 IMPLANT
BIT DRILL 2.8X128MM (BIT) ×1
BLADE SAGITTAL 25.0X1.27X90 (BLADE) ×2 IMPLANT
BLADE SAGITTAL 25.0X1.27X90MM (BLADE) ×1
CHLORAPREP W/TINT 26 (MISCELLANEOUS) ×5 IMPLANT
CLOTH BEACON ORANGE TIMEOUT ST (SAFETY) ×3 IMPLANT
COVER LIGHT HANDLE STERIS (MISCELLANEOUS) ×6 IMPLANT
DRAPE HALF SHEET 40X57 (DRAPES) ×2 IMPLANT
DRAPE HIP W/POCKET STRL (MISCELLANEOUS) ×3 IMPLANT
DRAPE U-SHAPE 47X51 STRL (DRAPES) ×3 IMPLANT
DRSG MEPILEX BORDER 4X12 (GAUZE/BANDAGES/DRESSINGS) ×3 IMPLANT
DRSG MEPILEX SACRM 8.7X9.8 (GAUZE/BANDAGES/DRESSINGS) ×3 IMPLANT
ELECT REM PT RETURN 9FT ADLT (ELECTROSURGICAL) ×3
ELECTRODE REM PT RTRN 9FT ADLT (ELECTROSURGICAL) ×1 IMPLANT
GLOVE SS N UNI LF 8.5 STRL (GLOVE) ×3 IMPLANT
GLOVE SURG POLYISO LF SZ7 (GLOVE) ×4 IMPLANT
GLOVE SURG POLYISO LF SZ8 (GLOVE) ×6 IMPLANT
GLOVE SURG UNDER POLY LF SZ7 (GLOVE) ×9 IMPLANT
GOWN STRL REUS W/TWL LRG LVL3 (GOWN DISPOSABLE) ×6 IMPLANT
GOWN STRL REUS W/TWL XL LVL3 (GOWN DISPOSABLE) ×3 IMPLANT
HANDPIECE INTERPULSE COAX TIP (DISPOSABLE) ×3
HEAD BIPOLAR DEPUY 48 (Hips) IMPLANT
HEAD FEM STD 28X+1.5 STRL (Hips) ×2 IMPLANT
INST SET MAJOR BONE (KITS) ×3 IMPLANT
KIT BLADEGUARD II DBL (SET/KITS/TRAYS/PACK) ×3 IMPLANT
KIT TURNOVER KIT A (KITS) ×3 IMPLANT
MANIFOLD NEPTUNE II (INSTRUMENTS) ×3 IMPLANT
MARKER SKIN DUAL TIP RULER LAB (MISCELLANEOUS) ×3 IMPLANT
NDL HYPO 18GX1.5 BLUNT FILL (NEEDLE) ×1 IMPLANT
NDL HYPO 21X1.5 SAFETY (NEEDLE) ×1 IMPLANT
NEEDLE HYPO 18GX1.5 BLUNT FILL (NEEDLE) ×3 IMPLANT
NEEDLE HYPO 21X1.5 SAFETY (NEEDLE) ×3 IMPLANT
NS IRRIG 1000ML POUR BTL (IV SOLUTION) ×3 IMPLANT
PACK TOTAL JOINT (CUSTOM PROCEDURE TRAY) ×3 IMPLANT
PAD ARMBOARD 7.5X6 YLW CONV (MISCELLANEOUS) ×3 IMPLANT
PENCIL SMOKE EVACUATOR (MISCELLANEOUS) ×3 IMPLANT
PIN STMN SNGL STERILE 9X3.6MM (PIN) ×6 IMPLANT
SET BASIN LINEN APH (SET/KITS/TRAYS/PACK) ×3 IMPLANT
SET HNDPC FAN SPRY TIP SCT (DISPOSABLE) ×1 IMPLANT
SPONGE T-LAP 18X18 ~~LOC~~+RFID (SPONGE) ×6 IMPLANT
STAPLER VISISTAT 35W (STAPLE) ×3 IMPLANT
STEM SUMMIT BASIC PRESSFIT SZ4 (Hips) ×2 IMPLANT
SUT BRALON NAB BRD #1 30IN (SUTURE) ×6 IMPLANT
SUT ETHIBOND 5 LR DA (SUTURE) ×6 IMPLANT
SUT MNCRL 0 VIOLET CTX 36 (SUTURE) ×1 IMPLANT
SUT MON AB 2-0 CT1 36 (SUTURE) ×3 IMPLANT
SUT MONOCRYL 0 CTX 36 (SUTURE) ×2
SUT VIC AB 1 CT1 27 (SUTURE) ×6
SUT VIC AB 1 CT1 27XBRD ANTBC (SUTURE) ×2 IMPLANT
SYR 20ML LL LF (SYRINGE) ×9 IMPLANT
SYR BULB IRRIG 60ML STRL (SYRINGE) ×5 IMPLANT
TRAY FOLEY MTR SLVR 16FR STAT (SET/KITS/TRAYS/PACK) ×3 IMPLANT
YANKAUER SUCT 12FT TUBE ARGYLE (SUCTIONS) ×3 IMPLANT

## 2021-02-03 NOTE — Anesthesia Preprocedure Evaluation (Signed)
Anesthesia Evaluation  Patient identified by MRN, date of birth, ID band Patient awake    Reviewed: Allergy & Precautions, NPO status , Patient's Chart, lab work & pertinent test results  Airway Mallampati: II  TM Distance: >3 FB Neck ROM: Full    Dental  (+) Edentulous Upper, Edentulous Lower   Pulmonary COPD,  COPD inhaler, Current Smoker and Patient abstained from smoking.,    Pulmonary exam normal breath sounds clear to auscultation       Cardiovascular Exercise Tolerance: Good Normal cardiovascular exam Rhythm:Regular Rate:Normal     Neuro/Psych PSYCHIATRIC DISORDERS Depression    GI/Hepatic GERD  Medicated and Controlled,  Endo/Other  diabetes, Well Controlled, Type 2, Oral Hypoglycemic Agents, Insulin Dependent  Renal/GU Renal InsufficiencyRenal disease     Musculoskeletal  (+) Arthritis ,   Abdominal   Peds  Hematology  (+) anemia ,   Anesthesia Other Findings   Reproductive/Obstetrics                            Anesthesia Physical Anesthesia Plan  ASA: 2  Anesthesia Plan: General/Spinal   Post-op Pain Management:    Induction: Intravenous  PONV Risk Score and Plan: Propofol infusion and Ondansetron  Airway Management Planned: Nasal Cannula and Natural Airway  Additional Equipment:   Intra-op Plan:   Post-operative Plan:   Informed Consent: I have reviewed the patients History and Physical, chart, labs and discussed the procedure including the risks, benefits and alternatives for the proposed anesthesia with the patient or authorized representative who has indicated his/her understanding and acceptance.       Plan Discussed with: CRNA and Surgeon  Anesthesia Plan Comments: (Possible GA with airway was discussed.)       Anesthesia Quick Evaluation

## 2021-02-03 NOTE — Progress Notes (Signed)
Patient prepped and ready for surgery .

## 2021-02-03 NOTE — Transfer of Care (Signed)
Immediate Anesthesia Transfer of Care Note  Patient: Douglas Cook  Procedure(s) Performed: ARTHROPLASTY BIPOLAR HIP (HEMIARTHROPLASTY) (Left: Hip)  Patient Location: PACU  Anesthesia Type:Spinal  Level of Consciousness: awake and patient cooperative  Airway & Oxygen Therapy: Patient Spontanous Breathing  Post-op Assessment: Report given to RN and Post -op Vital signs reviewed and stable  Post vital signs: Reviewed and stable  Last Vitals:  Vitals Value Taken Time  BP 114/64 02/03/21 1411  Temp 98.5   Pulse 83 02/03/21 1414  Resp 14 02/03/21 1414  SpO2 98 % 02/03/21 1414  Vitals shown include unvalidated device data.  Last Pain:  Vitals:   02/03/21 1200  TempSrc:   PainSc: 0-No pain         Complications: No notable events documented.

## 2021-02-03 NOTE — Op Note (Signed)
02/03/2021  2:12 PM  PATIENT:  Douglas Cook  75 y.o. male  PRE-OPERATIVE DIAGNOSIS:  left hip fracture  POST-OPERATIVE DIAGNOSIS:  left hip fracture  PROCEDURE:  Procedure(s): ARTHROPLASTY BIPOLAR HIP (HEMIARTHROPLASTY) (Left)  SURGEON:  Surgeon(s) and Role:    Vickki Hearing, MD - Primary  PHYSICIAN ASSISTANT:   ASSISTANTS: cynthia wrenn   ANESTHESIA:   spinal  EBL:  50 mL   BLOOD ADMINISTERED:none  DRAINS: none   LOCAL MEDICATIONS USED:  EXPAREL 20CC  SPECIMEN:  No Specimen  DISPOSITION OF SPECIMEN:  N/A  COUNTS:  YES  TOURNIQUET:  * No tourniquets in log *  DICTATION: .Dragon Dictation  PLAN OF CARE: Admit to inpatient   PATIENT DISPOSITION:  PACU - hemodynamically stable.   Delay start of Pharmacological VTE agent (>24hrs) due to surgical blood loss or risk of bleeding: yes  We did the surgery as follows  The patient was seen in the preop area and the surgical site was confirmed and marked as left hip chart review was completed including x-rays  Implants were checked  Patient was brought to the operating room for spinal anesthesia  After spinal anesthesia the patient had a Foley catheter inserted and then the patient was placed in lateral decubitus position left side up with axillary roll appropriate padding was placed on the lower extremities as well.  After sterile prep and drape and timeout direct lateral approach to the hip was performed  We made an incision over the greater trochanter extended proximally and distally divided subcutaneous tissue down to the fascia and split the fascia in line with the skin incision.  The patient had a large trochanteric bursa which was excised.  Then the hip was placed in 20 degrees of flexion the foot was externally rotated and the tendinous portion of the gluteus medius and minimus were dissected from the greater trochanter and tagged with two #1 Vicryl sutures.  This was retracted proximally and with  the assistance of a Cobb elevator the capsule was exposed and 2 Steinmann pins were placed in the pelvis  A capsulectomy was performed.  The hip was dislocated and then a corkscrew placed on a drill bit was used to control the head which was removed and measured 48 mm in diameter  The acetabulum was inspected irrigated dried there was a small amount of arthritis at the superior weightbearing edge approximately 4 to 5 mm x 5 mm with approximately 50% depth  The neck cut was made with a guide.  And then sequential instruments were placed as follows.  A box cutter was placed in line with the femoral neck followed by starter hole reamer followed by canal finder and trochanteric reamer.  Serial broaching was performed up to a size 4 and then a trial reduction was done with a 1.5 neck and a 48 head.  This restored leg lengths.  Shuck test was excellent.  Flexion extension internal rotation and sleep positions were all stable  The trial implants were removed another irrigation was performed 2 drill holes were placed in the greater trochanter followed by insertion of a #5 suture and then the implant was placed for starting with the press-fit size 4 stem  The head and neck pieces were placed and then the hip was reduced we were able to reproduce our trial reduction the abductors were repaired with #1 Braylon the #5 suture x2.  The soft tissues around the hip were injected with exparel  The hip was placed  in abduction on a padded Mayo stand and #1 Bralon suture in interrupted fashion was used to close the fascia  A layer of 0 Monocryl in interrupted fashion was used to close the subcu tissue followed by running 2-0 Monocryl and staples  Sterile dressing was applied  The patient was taken back to the bed and then to the recovery room with the following postop plan   Weightbearing status as tolerated  Remove staples on postop day 14  Office follow-up on postop day 28  X-ray on postop day 28 in the  office  Aspirin for DVT prophylaxis for 30 days

## 2021-02-03 NOTE — Progress Notes (Signed)
Patient removed his gown again, wrapping from iv, there was towel between his legs soaked with urine and all patient's linen thrown in floor. Patient given another bath, repositioned, cleansed and rewrapped iv. Placed condom cath on patient. Patient instructed to not pull on or mess with anything for his safety. He expressed understanding.

## 2021-02-03 NOTE — Progress Notes (Signed)
Patient back from surgery. Vitals stable. Neuro checks with in normal. Patient can somewhat wiggle toes to bilat feet. Mild confusion, present prior to surgery. Provided warm blanket. Scds in place.

## 2021-02-03 NOTE — Progress Notes (Signed)
Patient has spiked a temp, notified Dr. Gwenlyn Perking of elevation and of RED mews.

## 2021-02-03 NOTE — Care Management Important Message (Signed)
Important Message  Patient Details  Name: Douglas Cook MRN: [id number] Date of Birth: June 17, 1945   Medicare Important Message Given:  Yes (left copy on bedside table)     Corey Harold 02/03/2021, 1:09 PM

## 2021-02-03 NOTE — Anesthesia Procedure Notes (Signed)
Spinal  Patient location during procedure: OR Start time: 02/03/2021 11:50 AM End time: 02/03/2021 12:04 PM Reason for block: surgical anesthesia Staffing Performed: anesthesiologist  Anesthesiologist: Molli Barrows, MD Preanesthetic Checklist Completed: patient identified, IV checked, site marked, risks and benefits discussed, surgical consent, monitors and equipment checked, pre-op evaluation and timeout performed Spinal Block Patient position: right lateral decubitus Prep: Betadine, ChloraPrep and DuraPrep Patient monitoring: heart rate, cardiac monitor, continuous pulse ox and blood pressure Approach: right paramedian Location: L3-4 Injection technique: single-shot Needle Needle type: Spinocan  Needle gauge: 22 G Needle length: 9 cm Needle insertion depth: 7 cm Assessment Sensory level: T8 Events: CSF return

## 2021-02-03 NOTE — Interval H&P Note (Signed)
History and Physical Interval Note:  02/03/2021 11:00 AM  Douglas Cook  has presented today for surgery, with the diagnosis of left hip fracture.  The various methods of treatment have been discussed with the patient and family. After consideration of risks, benefits and other options for treatment, the patient has consented to  Procedure(s): ARTHROPLASTY BIPOLAR HIP (HEMIARTHROPLASTY) (Left) as a surgical intervention.  The patient's history has been reviewed, patient examined, no change in status, stable for surgery.  I have reviewed the patient's chart and labs.  Questions were answered to the patient's satisfaction.    CBC Latest Ref Rng & Units 02/02/2021 02/01/2021 07/05/2017  WBC 4.0 - 10.5 K/uL 10.5 11.3(H) 5.0  Hemoglobin 13.0 - 17.0 g/dL 11.9(L) 11.4(L) 8.9(L)  Hematocrit 39.0 - 52.0 % 36.7(L) 34.3(L) 28.8(L)  Platelets 150 - 400 K/uL 213 211 227   BMP Latest Ref Rng & Units 02/02/2021 02/01/2021 07/05/2017  Glucose 70 - 99 mg/dL 893(T) 342(A) 68  BUN 8 - 23 mg/dL 76(O) 11(X) 72(I)  Creatinine 0.61 - 1.24 mg/dL 2.03(T) 5.97(C) 1.63(A)  Sodium 135 - 145 mmol/L 140 139 137  Potassium 3.5 - 5.1 mmol/L 4.8 4.7 4.2  Chloride 98 - 111 mmol/L 103 104 103  CO2 22 - 32 mmol/L 27 26 25   Calcium 8.9 - 10.3 mg/dL 9.4 9.4 )    4.5(X

## 2021-02-03 NOTE — Progress Notes (Signed)
PROGRESS NOTE    Douglas Cook  [id number] DOB: Sep 19, 1945 DOA: 02/01/2021 PCP: Glenda Chroman, MD    Chief Complaint  Patient presents with   Fall    Brief Narrative:  As per H&P written by Dr. Clearence Ped on 02/02/21 Douglas Cook  is a 75 y.o. male, with history of anemia of chronic disease, chronic kidney disease stage III, COPD, diabetes mellitus, hyperlipidemia, reflux, and more presents the ED with a chief complaint of knee pain.  Patient is coming from a skilled nursing facility.  They had a fire drill today, and he was outside trying to move objects out of the way, when he tripped and fell and landed on his left knee.  Patient has a difficult time remembering what happened, this is likely his baseline.  At first patient said he could not really tell me why he was here, and when he was reminded that he fell he was able to provide some of the details.  Patient reports that prior to falling he had no palpitations, no chest pain, no dizziness.  He did feel short of breath but attributes that to exerting himself.  He did not hit his head.  He thinks he did pass out, since he cannot remember the event very well.  The way he describes it though, he was aware of everything going around him the whole time.  Patient reports that he initially did not feel the knee pain that was sharp.  Rest improved it, attempting to weight-bear made it worse.  Left knee scans did not show any acute abnormalities.  As he was being discharged he mentioned left hip pain.  Imaging of left hip pain shows left closed fracture.   Assessment & Plan: 1-left hip fracture -Continue as needed analgesic regimen -Patient remains n.p.o. with anticipated surgical intervention later today -Will follow orthopedic service recommendations for weightbearing and DVT prophylaxis. -Physical therapy evaluation after surgery. -Continue supportive care  2-COPD (chronic obstructive pulmonary disease) ([place]) -Stable and currently  no wheezing or demonstrating exacerbation -Continue as needed bronchodilators.  3-Type 2 diabetes mellitus (HCC) -Continue sliding scale and reduced dose of basal insulin. -Follow CBGs and adjust hypoglycemic regimen as required.  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 -Continue to follow renal function trend.  5-gastroesophageal reflux disease -Continue PPI  6-hypertension -Continue current antihypertensive agents.  7-hyperlipidemia -Continue statins  8-history of BPH -No complaining of urinary retention -Continue the use of Flomax.   DVT prophylaxis: SCDs; holding from a surgical DVT prophylaxis in anticipation for surgical intervention. Code Status: (Full/Partial - specify details) Family Communication: No family at bedside currently; patient's friend/preacher updated of plan of care (as per patient request) on 02/02/2021. Disposition:   Status is: Inpatient  Remains inpatient appropriate because: In need of surgical repair after left hip fracture.       Consultants:  Orthopedic service  Procedures:  Anticipated left hemiarthroplasty later today (02/03/2021).  Antimicrobials:  None   Subjective: Afebrile, no chest pain, no nausea, no vomiting.  Good saturation on room air.  Complaining of left lower extremity pain with movement.  Objective: Vitals:   02/03/21 1500 02/03/21 1520 02/03/21 1545 02/03/21 1741  BP: 126/78 127/88 (!) 141/77 (!) 161/73  Pulse: 87 95 (!) 103 (!) 114  Resp: 17 19  19   Temp:  98.5 F (36.9 C) 98.9 F (37.2  C) (!) 102.1 F (38.9 C)  TempSrc:    Oral  SpO2: 100% 100% 92% 98%  Weight:      Height:        Intake/Output Summary (Last 24 hours) at 02/03/2021 1745 Last data filed at 02/03/2021 1628 Gross per 24 hour  Intake 2340 ml   Output 350 ml  Net 1990 ml   Filed Weights   02/01/21 2244 02/03/21 1042  Weight: 85.2 kg 85 kg    Examination: General exam: Appears calm and comfortable; reports left lower extremity pain with movement.  No shortness of breath, no chest pain, no nausea, no vomiting.  Good saturation on room air.  Patient is afebrile Respiratory system: Clear to auscultation. Respiratory effort normal.  No using accessory muscles. Cardiovascular system: S1 & S2 heard, RRR. No JVD, murmurs, rubs, gallops or clicks. No pedal edema. Gastrointestinal system: Abdomen is nondistended, soft and nontender. No organomegaly or masses felt. Normal bowel sounds heard. Central nervous system: Alert and oriented. No focal neurological deficits. Extremities: Left lower extremity externally rotated, decreased range of motion secondary to pain.  No cyanosis or clubbing. Skin: No petechiae. Psychiatry: Judgement and insight appear normal. Mood & affect appropriate.     Data Reviewed: I have personally reviewed following labs and imaging studies  CBC: Recent Labs  Lab 02/01/21 2001 02/02/21 0215  WBC 11.3* 10.5  NEUTROABS 7.5 7.9*  HGB 11.4* 11.9*  HCT 34.3* 36.7*  MCV 93.5 93.1  PLT 211 [id number]    Basic Metabolic Panel: Recent Labs  Lab 02/01/21 2001 02/02/21 0215  NA 139 140  K 4.7 4.8  CL 104 103  CO2 26 27  GLUCOSE 147* 183*  BUN 35* 30*  CREATININE 1.64* 1.47*  CALCIUM 9.4 9.4    GFR: Estimated Creatinine Clearance: 49.1 mL/min (A) (by C-G formula based on SCr of 1.47 mg/dL (H)).  Liver Function Tests: Recent Labs  Lab 02/02/21 0215  AST 16  ALT 19  ALKPHOS 38  BILITOT 0.6  PROT 6.7  ALBUMIN 3.9    CBG: Recent Labs  Lab 02/02/21 1158 02/02/21 1647 02/02/21 2043 02/03/21 0722 02/03/21 1559  GLUCAP 154* 174* 293* 179* 165*     Recent Results (from the past 240 hour(s))  Resp Panel by RT-PCR (Flu A&B, Covid) Nasopharyngeal Swab     Status: None   Collection Time: 02/01/21   8:14 PM   Specimen: Nasopharyngeal Swab; Nasopharyngeal(NP) swabs in vial transport medium  Result Value Ref Range Status   SARS Coronavirus 2 by RT PCR NEGATIVE NEGATIVE Final    Comment: (NOTE) SARS-CoV-2 target nucleic acids are NOT DETECTED.  The SARS-CoV-2 RNA is generally detectable in upper respiratory specimens during the acute phase of infection. The lowest concentration of SARS-CoV-2 viral copies this assay can detect is 138 copies/mL. A negative result does not preclude SARS-Cov-2 infection and should not be used as the sole basis for treatment or other patient management decisions. A negative result may occur with  improper specimen collection/handling, submission of specimen other than nasopharyngeal swab, presence of viral mutation(s) within the areas targeted by this assay, and inadequate number of viral copies(<138 copies/mL). A negative result must be combined with clinical observations, patient history, and epidemiological information. The expected result is Negative.  Fact Sheet for Patients:  EntrepreneurPulse.com.au  Fact Sheet for Healthcare Providers:  IncredibleEmployment.be  This test is no t yet approved or cleared by the [place] FDA and  has been authorized for detection and/or diagnosis of  SARS-CoV-2 by FDA under an Emergency Use Authorization (EUA). This EUA will remain  in effect (meaning this test can be used) for the duration of the COVID-19 declaration under Section 564(b)(1) of the Act, 21 U.S.C.section 360bbb-3(b)(1), unless the authorization is terminated  or revoked sooner.       Influenza A by PCR NEGATIVE NEGATIVE Final   Influenza B by PCR NEGATIVE NEGATIVE Final    Comment: (NOTE) The Xpert Xpress SARS-CoV-2/FLU/RSV plus assay is intended as an aid in the diagnosis of influenza from Nasopharyngeal swab specimens and should not be used as a sole basis for treatment. Nasal washings and aspirates  are unacceptable for Xpert Xpress SARS-CoV-2/FLU/RSV testing.  Fact Sheet for Patients: BloggerCourse.com  Fact Sheet for Healthcare Providers: SeriousBroker.it  This test is not yet approved or cleared by the [place] FDA and has been authorized for detection and/or diagnosis of SARS-CoV-2 by FDA under an Emergency Use Authorization (EUA). This EUA will remain in effect (meaning this test can be used) for the duration of the COVID-19 declaration under Section 564(b)(1) of the Act, 21 U.S.C. section 360bbb-3(b)(1), unless the authorization is terminated or revoked.  Performed at [facility], [address] [place], [place] [address]   Surgical pcr screen     Status: Abnormal   Collection Time: 02/02/21  7:21 AM   Specimen: Nasal Mucosa; Nasal Swab  Result Value Ref Range Status   MRSA, PCR NEGATIVE NEGATIVE Final    Comment: RCRV BERNARD BRANDI @1008  02/02/2021 BY ADGER J.   Staphylococcus aureus POSITIVE (A) NEGATIVE Final    Comment: RCRV BERNARD BRANDI @1008  02/02/2021 BY ADGER J (NOTE) The Xpert SA Assay (FDA approved for NASAL specimens in patients 39 years of age and older), is one component of a comprehensive surveillance program. It is not intended to diagnose infection nor to guide or monitor treatment. Performed at [facility], [address], [place], [address] [place]      Radiology Studies: DG Pelvis Portable  Result Date: 02/03/2021 CLINICAL DATA:  Left hip replacement. EXAM: PORTABLE PELVIS 1-2 VIEWS COMPARISON:  Left hip x-rays dated February 01, 2021. FINDINGS: The left hip demonstrates a bipolar hemiarthroplasty without evidence of hardware failure or complication. There is no fracture or dislocation. The alignment is anatomic. Post-surgical changes noted in the surrounding soft tissues. IMPRESSION: 1. Left hip bipolar hemiarthroplasty without acute postoperative complication. Electronically Signed   By:  13/01/2021 M.D.   On: 02/03/2021 14:43   DG Knee Complete 4 Views Left  Result Date: 02/01/2021 CLINICAL DATA:  Fall.  Left knee pain. EXAM: LEFT KNEE - COMPLETE 4+ VIEW COMPARISON:  None. FINDINGS: The mineralization and alignment are normal. There is no evidence of acute fracture or dislocation. The joint spaces are relatively preserved, although there is chondrocalcinosis of both menisci and small joint effusion. There is mild spurring at the quadriceps insertion on the patella. IMPRESSION: No acute osseous findings or significant arthropathic changes. Meniscal chondrocalcinosis and small joint effusion. Electronically Signed   By: 13/11/2020 M.D.   On: 02/01/2021 18:04   DG HIP UNILAT WITH PELVIS 2-3 VIEWS LEFT  Result Date: 02/01/2021 CLINICAL DATA:  Pain after fall today EXAM: DG HIP (WITH OR WITHOUT PELVIS) 2-3V LEFT COMPARISON:  04/08/2013 FINDINGS: Acute left femoral neck fracture with mild displacement. Femoral head projects in joint. IMPRESSION: Acute mildly displaced left femoral neck fracture Electronically Signed   By: 13/11/2020 M.D.   On: 02/01/2021 19:36    Scheduled Meds:  aspirin EC  81 mg Oral Daily   atorvastatin  10 mg Oral Daily   Chlorhexidine Gluconate Cloth  6 each Topical Q0600   citalopram  20 mg Oral QHS   docusate sodium  100 mg Oral BID   insulin aspart  0-20 Units Subcutaneous TID WC   insulin aspart  0-5 Units Subcutaneous QHS   insulin detemir  50 Units Subcutaneous QHS   Ipratropium-Albuterol  2 puff Inhalation BID   lamoTRIgine  100 mg Oral Daily   lisinopril  2.5 mg Oral Daily   mupirocin ointment  1 application Nasal BID   pantoprazole  40 mg Oral Daily   risperiDONE  0.5 mg Oral QHS   tamsulosin  0.4 mg Oral Daily   traMADol  50 mg Oral Q6H   traZODone  100 mg Oral QHS   Continuous Infusions:   ceFAZolin (ANCEF) IV 2 g (02/03/21 1628)   methocarbamol (ROBAXIN) IV       LOS: 2 days    Time spent: 35 minutes    Barton Dubois,  MD [facility]   To contact the attending provider between 7A-7P or the covering provider during after hours 7P-7A, please log into the web site [email or web address] and access using universal  password for that web site. If you do not have the password, please call the hospital operator.  02/03/2021, 5:45 PM

## 2021-02-03 NOTE — Progress Notes (Signed)
Patient education with incentive spirometry attempted, not understood very well at this time.

## 2021-02-03 NOTE — Anesthesia Postprocedure Evaluation (Signed)
Anesthesia Post Note  Patient: Douglas Cook  Procedure(s) Performed: ARTHROPLASTY BIPOLAR HIP (HEMIARTHROPLASTY) (Left: Hip)  Patient location during evaluation: PACU Anesthesia Type: Combined General/Spinal Level of consciousness: awake and alert and oriented Pain management: pain level controlled Vital Signs Assessment: post-procedure vital signs reviewed and stable Respiratory status: spontaneous breathing, nonlabored ventilation and respiratory function stable Cardiovascular status: blood pressure returned to baseline and stable Postop Assessment: no apparent nausea or vomiting Anesthetic complications: no   No notable events documented.   Last Vitals:  Vitals:   02/03/21 1520 02/03/21 1545  BP: 127/88 (!) 141/77  Pulse: 95 (!) 103  Resp: 19   Temp: 36.9 C 37.2 C  SpO2: 100% 92%    Last Pain:  Vitals:   02/03/21 1443  TempSrc:   PainSc: 0-No pain                 Nissi Doffing C Dain Laseter

## 2021-02-03 NOTE — Anesthesia Procedure Notes (Signed)
Date/Time: 02/03/2021 11:11 AM Performed by: Franco Nones, CRNA Pre-anesthesia Checklist: Patient identified, Emergency Drugs available, Suction available, Timeout performed and Patient being monitored Patient Re-evaluated:Patient Re-evaluated prior to induction Oxygen Delivery Method: Nasal Cannula

## 2021-02-03 NOTE — Plan of Care (Signed)

## 2021-02-03 NOTE — Brief Op Note (Signed)
02/03/2021  2:12 PM  PATIENT:  Douglas Cook  75 y.o. male  PRE-OPERATIVE DIAGNOSIS:  left hip fracture  POST-OPERATIVE DIAGNOSIS:  left hip fracture  PROCEDURE:  Procedure(s): ARTHROPLASTY BIPOLAR HIP (HEMIARTHROPLASTY) (Left)  SURGEON:  Surgeon(s) and Role:    Vickki Hearing, MD - Primary  PHYSICIAN ASSISTANT:   ASSISTANTS: cynthia wrenn   ANESTHESIA:   spinal  EBL:  50 mL   BLOOD ADMINISTERED:none  DRAINS: none   LOCAL MEDICATIONS USED:  EXPAREL 20CC  SPECIMEN:  No Specimen  DISPOSITION OF SPECIMEN:  N/A  COUNTS:  YES  TOURNIQUET:  * No tourniquets in log *  DICTATION: .Dragon Dictation  PLAN OF CARE: Admit to inpatient   PATIENT DISPOSITION:  PACU - hemodynamically stable.   Delay start of Pharmacological VTE agent (>24hrs) due to surgical blood loss or risk of bleeding: yes  We did the surgery as follows  The patient was seen in the preop area and the surgical site was confirmed and marked as left hip chart review was completed including x-rays  Implants were checked  Patient was brought to the operating room for spinal anesthesia  After spinal anesthesia the patient had a Foley catheter inserted and then the patient was placed in lateral decubitus position left side up with axillary roll appropriate padding was placed on the lower extremities as well.  After sterile prep and drape and timeout direct lateral approach to the hip was performed  We made an incision over the greater trochanter extended proximally and distally divided subcutaneous tissue down to the fascia and split the fascia in line with the skin incision.  The patient had a large trochanteric bursa which was excised.  Then the hip was placed in 20 degrees of flexion the foot was externally rotated and the tendinous portion of the gluteus medius and minimus were dissected from the greater trochanter and tagged with two #1 Vicryl sutures.  This was retracted proximally and with  the assistance of a Cobb elevator the capsule was exposed and 2 Steinmann pins were placed in the pelvis  A capsulectomy was performed.  The hip was dislocated and then a corkscrew placed on a drill bit was used to control the head which was removed and measured 48 mm in diameter  The acetabulum was inspected irrigated dried there was a small amount of arthritis at the superior weightbearing edge approximately 4 to 5 mm x 5 mm with approximately 50% depth  The neck cut was made with a guide.  And then sequential instruments were placed as follows.  A box cutter was placed in line with the femoral neck followed by starter hole reamer followed by canal finder and trochanteric reamer.  Serial broaching was performed up to a size 4 and then a trial reduction was done with a 1.5 neck and a 48 head.  This restored leg lengths.  Shuck test was excellent.  Flexion extension internal rotation and sleep positions were all stable  The trial implants were removed another irrigation was performed 2 drill holes were placed in the greater trochanter followed by insertion of a #5 suture and then the implant was placed for starting with the press-fit size 4 stem  The head and neck pieces were placed and then the hip was reduced we were able to reproduce our trial reduction the abductors were repaired with #1 Braylon the #5 suture x2.  The soft tissues around the hip were injected with exparel  The hip was placed  in abduction on a padded Mayo stand and #1 Bralon suture in interrupted fashion was used to close the fascia  A layer of 0 Monocryl in interrupted fashion was used to close the subcu tissue followed by running 2-0 Monocryl and staples  Sterile dressing was applied  The patient was taken back to the bed and then to the recovery room with the following postop plan   Weightbearing status as tolerated  Remove staples on postop day 14  Office follow-up on postop day 28  X-ray on postop day 28 in the  office  Aspirin for DVT prophylaxis for 30 days

## 2021-02-04 ENCOUNTER — Inpatient Hospital Stay (HOSPITAL_COMMUNITY): Payer: Medicare Other

## 2021-02-04 LAB — URINALYSIS, ROUTINE W REFLEX MICROSCOPIC
Bacteria, UA: NONE SEEN
Bilirubin Urine: NEGATIVE
Glucose, UA: 50 mg/dL — AB
Ketones, ur: NEGATIVE mg/dL
Nitrite: NEGATIVE
Protein, ur: 30 mg/dL — AB
Specific Gravity, Urine: 1.025 (ref 1.005–1.030)
pH: 5 (ref 5.0–8.0)

## 2021-02-04 LAB — GLUCOSE, CAPILLARY
Glucose-Capillary: 214 mg/dL — ABNORMAL HIGH (ref 70–99)
Glucose-Capillary: 189 mg/dL — ABNORMAL HIGH (ref 70–99)
Glucose-Capillary: 142 mg/dL — ABNORMAL HIGH (ref 70–99)
Glucose-Capillary: 236 mg/dL — ABNORMAL HIGH (ref 70–99)

## 2021-02-04 LAB — BASIC METABOLIC PANEL
Anion gap: 9 (ref 5–15)
BUN: 20 mg/dL (ref 8–23)
CO2: 26 mmol/L (ref 22–32)
Calcium: 8.5 mg/dL — ABNORMAL LOW (ref 8.9–10.3)
Chloride: 99 mmol/L (ref 98–111)
Creatinine, Ser: 1.36 mg/dL — ABNORMAL HIGH (ref 0.61–1.24)
GFR, Estimated: 55 mL/min — ABNORMAL LOW (ref >60)
Glucose, Bld: 227 mg/dL — ABNORMAL HIGH (ref 70–99)
Potassium: 4.4 mmol/L (ref 3.5–5.1)
Sodium: 134 mmol/L — ABNORMAL LOW (ref 135–145)

## 2021-02-04 LAB — CBC
HCT: 33.5 % — ABNORMAL LOW (ref 39.0–52.0)
Hemoglobin: 11.2 g/dL — ABNORMAL LOW (ref 13.0–17.0)
MCH: 31.1 pg (ref 26.0–34.0)
MCHC: 33.4 g/dL (ref 30.0–36.0)
MCV: 93.1 fL (ref 80.0–100.0)
Platelets: 168 10*3/uL (ref 150–400)
RBC: 3.6 MIL/uL — ABNORMAL LOW (ref 4.22–5.81)
RDW: 13.4 % (ref 11.5–15.5)
WBC: 10.4 10*3/uL (ref 4.0–10.5)
nRBC: 0 % (ref 0.0–0.2)

## 2021-02-04 MED ORDER — ASPIRIN EC 81 MG PO TBEC
81.0000 mg | DELAYED_RELEASE_TABLET | Freq: Two times a day (BID) | ORAL | Status: DC
  Administered 2021-02-04 – 2021-02-07 (×6): 81 mg via ORAL
  Filled 2021-02-04 (×6): qty 1

## 2021-02-04 MED ORDER — SODIUM CHLORIDE 0.9 % IV BOLUS
500.0000 mL | Freq: Once | INTRAVENOUS | Status: AC
Start: 2021-02-04 — End: 2021-02-04
  Administered 2021-02-04: 500 mL via INTRAVENOUS

## 2021-02-04 MED ORDER — TRAMADOL HCL 50 MG PO TABS: 50.0000 mg | ORAL_TABLET | Freq: Three times a day (TID) | ORAL | Status: DC | PRN

## 2021-02-04 NOTE — Progress Notes (Addendum)
Patient ID: Douglas Cook, male   DOB: 23-Feb-1946, 75 y.o.   MRN: [id number] Post op day 1   S/P LEFT BIPOLAR PARTIAL HIP REPLACEMENT  DX: LEFT FEMORAL NCH FRX  BP 119/72   Pulse (!) 107   Temp 99.8 F (37.7 C)   Resp 18   Ht 6' (1.829 m)   Wt 85 kg   SpO2 94%   BMI 25.41 kg/m   Physical Exam Constitutional:      Comments: SOMNOLENT BUT EASILY AROUSED FOLLOWS COMMANDS   Musculoskeletal:       Legs:    BMP Latest Ref Rng & Units 02/04/2021 02/02/2021 02/01/2021  Glucose 70 - 99 mg/dL 175(Z) 025(E) 527(P)  BUN 8 - 23 mg/dL 20 82(U) 23(N)  Creatinine 0.61 - 1.24 mg/dL 3.61(W) 4.31(V) 4.00(Q)  Sodium 135 - 145 mmol/L 134(L) 140 139  Potassium 3.5 - 5.1 mmol/L 4.4 4.8 4.7  Chloride 98 - 111 mmol/L 99 103 104  CO2 22 - 32 mmol/L 26 27 26   Calcium 8.9 - 10.3 mg/dL ) 9.4 9.4   CBC Latest Ref Rng & Units 02/04/2021 02/02/2021 02/01/2021  WBC 4.0 - 10.5 K/uL 10.4 10.5 11.3(H)  Hemoglobin 13.0 - 17.0 g/dL 11.2(L) 11.9(L) 11.4(L)  Hematocrit 39.0 - 52.0 % 33.5(L) 36.7(L) 34.3(L)  Platelets 150 - 400 K/uL 168 213 211   S/P LEFT HIP BIPOLAR REPL  START PT  WBAT  ASPIRIN FOR DVT PREVENTION  NEEDS SOME INSULIN

## 2021-02-04 NOTE — Plan of Care (Signed)
  Problem: Acute Rehab PT Goals(only PT should resolve) Goal: Pt will Roll Supine to Side Outcome: Progressing Flowsheets (Taken 02/04/2021 1019) Pt will Roll Supine to Side:  with min assist  with rail Goal: Pt Will Go Supine/Side To Sit Outcome: Progressing Flowsheets (Taken 02/04/2021 1019) Pt will go Supine/Side to Sit: with minimal assist Goal: Pt Will Go Sit To Supine/Side Outcome: Progressing Flowsheets (Taken 02/04/2021 1019) Pt will go Sit to Supine/Side: with minimal assist Goal: Patient Will Perform Sitting Balance Outcome: Progressing Flowsheets (Taken 02/04/2021 1019) Patient will perform sitting balance:  with supervision  3- 5 min Goal: Patient Will Transfer Sit To/From Stand Outcome: Progressing Flowsheets (Taken 02/04/2021 1019) Patient will transfer sit to/from stand: with minimal assist Goal: Pt Will Transfer Bed To Chair/Chair To Bed Outcome: Progressing Flowsheets (Taken 02/04/2021 1019) Pt will Transfer Bed to Chair/Chair to Bed: with min assist Goal: Pt Will Ambulate Outcome: Progressing Flowsheets (Taken 02/04/2021 1019) Pt will Ambulate:  50 feet  with minimal assist  with rolling walker  10:20 AM, 02/04/21 M. Shary Decamp, PT, DPT Physical Therapist- [place] Office Number: [phone]

## 2021-02-04 NOTE — Progress Notes (Signed)
PROGRESS NOTE    Douglas Cook  DPO:[id number] DOB: 10-25-1945 DOA: 02/01/2021 PCP: Ignatius Specking, MD    Chief Complaint  Patient presents with   Fall    Brief Narrative:  As per H&P written by Dr. Carren Rang on 02/02/21 Douglas Cook  is a 75 y.o. male, with history of anemia of chronic disease, chronic kidney disease stage III, COPD, diabetes mellitus, hyperlipidemia, reflux, and more presents the ED with a chief complaint of knee pain.  Patient is coming from a skilled nursing facility.  They had a fire drill today, and he was outside trying to move objects out of the way, when he tripped and fell and landed on his left knee.  Patient has a difficult time remembering what happened, this is likely his baseline.  At first patient said he could not really tell me why he was here, and when he was reminded that he fell he was able to provide some of the details.  Patient reports that prior to falling he had no palpitations, no chest pain, no dizziness.  He did feel short of breath but attributes that to exerting himself.  He did not hit his head.  He thinks he did pass out, since he cannot remember the event very well.  The way he describes it though, he was aware of everything going around him the whole time.  Patient reports that he initially did not feel the knee pain that was sharp.  Rest improved it, attempting to weight-bear made it worse.  Left knee scans did not show any acute abnormalities.  As he was being discharged he mentioned left hip pain.  Imaging of left hip pain shows left closed fracture.   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 -Following recommendations by orthopedic service patient will be using aspirin twice a day for DVT prophylaxis strategy and weightbearing as tolerated. -Physical therapy has seen patient and is recommending skilled nursing facility at discharge. -Continue  supportive care  2-COPD (chronic obstructive pulmonary disease) (HCC) -Stable and currently no wheezing or demonstrating exacerbation. -Continue as needed bronchodilators.  3-Type 2 diabetes mellitus (HCC) -Continue sliding scale and reduced dose of basal insulin. -Follow CBGs and adjust hypoglycemic regimen as required.  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 -Continue to follow renal function trend.  5-gastroesophageal reflux disease -Continue PPI  6-hypertension/soft blood pressure -Probably in the setting of analgesics usage -Holding antihypertensive agents at this time and closely watching vital signs.  7-hyperlipidemia -Continue statins  8-history of BPH -No complaining of urinary retention -Continue the use of Flomax.  9-fever -unknown source currently -Will check blood cultures, UA and chest x-ray -As needed antipyretics will be provided.   DVT prophylaxis: SCDs and ASA BID now. Code Status: (Full/Partial - specify details) Family Communication: No family at bedside. Disposition:   Status is: Inpatient  Remains inpatient appropriate because: In need of skilled nursing facility for rehabilitation and conditioning; patient has been seen by physical therapy.  Orthopedic service recommended weightbearing as tolerated.  Patient spiking fevers and getting work-up to rule out infections.   Consultants:  Orthopedic service  Procedures:  S/p eft hemiarthroplasty later today (02/03/2021).  Antimicrobials:  None   Subjective: Spiking fever, increased somnolence.  Denies chest pain, no nausea or vomiting.  Reports no  left hip pain currently.  Patient evaluated hemiarthroplasty on his left hip without complications on [id number].  Objective: Vitals:   02/04/21 1223 02/04/21  1224 02/04/21 1400 02/04/21 1404  BP:   (!) 95/56 (!) 102/50  Pulse:   (!) 106   Resp:      Temp: (!) 102.2 F (39 C) 99.9 F (37.7 C) (!) 102.1 F (38.9 C)   TempSrc: Axillary Oral Rectal   SpO2:      Weight:      Height:        Intake/Output Summary (Last 24 hours) at 02/04/2021 1454 Last data filed at 02/04/2021 [id number] Gross per 24 hour  Intake 428.33 ml  Output 1100 ml  Net -671.67 ml   Filed Weights   02/01/21 2244 02/03/21 1042  Weight: 85.2 kg 85 kg    Examination: General exam: Alert, awake, oriented x 2, patient is lethargic not complaining of hip pain currently and experiencing decreased oral intake and fever. Respiratory system: No using accessory muscles.  Good saturation on room air. Cardiovascular system: RRR. No murmurs, rubs, gallops. Gastrointestinal system: Abdomen is nondistended, soft and nontender. No organomegaly or masses felt. Normal bowel sounds heard. Central nervous system: No focal neurological deficits.  Decreased movement on his left lower extremity secondary to recent hip surgery. Extremities: No cyanosis or clubbing; clean dressings appreciated on the lateral aspect of his left hip. Skin: No petechiae. Psychiatry: Mood & affect appropriate.   Data Reviewed: I have personally reviewed following labs and imaging studies  CBC: Recent Labs  Lab 02/01/21 2001 02/02/21 0215 02/04/21 0543  WBC 11.3* 10.5 10.4  NEUTROABS 7.5 7.9*  --   HGB 11.4* 11.9* 11.2*  HCT 34.3* 36.7* 33.5*  MCV 93.5 93.1 93.1  PLT 211 213 [id number]    Basic Metabolic Panel: Recent Labs  Lab 02/01/21 2001 02/02/21 0215 02/04/21 0543  NA 139 140 134*  K 4.7 4.8 4.4  CL 104 103 99  CO2 26 27 26   GLUCOSE 147* 183* 227*  BUN 35* 30* 20  CREATININE 1.64* 1.47* 1.36*  CALCIUM 9.4 9.4 8.5*    GFR: Estimated Creatinine Clearance: 53.1 mL/min (A) (by C-G formula based on SCr of 1.36 mg/dL (H)).  Liver Function Tests: Recent Labs  Lab 02/02/21 0215  AST 16  ALT  19  ALKPHOS 38  BILITOT 0.6  PROT 6.7  ALBUMIN 3.9    CBG: Recent Labs  Lab 02/03/21 0722 02/03/21 1559 02/03/21 2023 02/04/21 0743 02/04/21 1148  GLUCAP 179* 165* 180* 214* 189*     Recent Results (from the past 240 hour(s))  Resp Panel by RT-PCR (Flu A&B, Covid) Nasopharyngeal Swab     Status: None   Collection Time: 02/01/21  8:14 PM   Specimen: Nasopharyngeal Swab; Nasopharyngeal(NP) swabs in vial transport medium  Result Value Ref Range Status   SARS Coronavirus 2 by RT PCR NEGATIVE NEGATIVE Final    Comment: (NOTE) SARS-CoV-2 target nucleic acids are NOT DETECTED.  The SARS-CoV-2 RNA is generally detectable in upper respiratory specimens during the acute phase of infection. The lowest concentration of SARS-CoV-2 viral copies this assay can detect is 138 copies/mL. A negative result does not preclude SARS-Cov-2 infection and should not be used as the sole basis for treatment or other patient management decisions. A negative result may occur with  improper specimen collection/handling, submission of specimen other than nasopharyngeal swab, presence of viral mutation(s) within the areas targeted by this assay, and inadequate number of viral copies(<138 copies/mL).  A negative result must be combined with clinical observations, patient history, and epidemiological information. The expected result is Negative.  Fact Sheet for Patients:  EntrepreneurPulse.com.au  Fact Sheet for Healthcare Providers:  IncredibleEmployment.be  This test is no t yet approved or cleared by the [place] FDA and  has been authorized for detection and/or diagnosis of SARS-CoV-2 by FDA under an Emergency Use Authorization (EUA). This EUA will remain  in effect (meaning this test can be used) for the duration of the COVID-19 declaration under Section 564(b)(1) of the Act, 21 U.S.C.section 360bbb-3(b)(1), unless the authorization is terminated  or  revoked sooner.       Influenza A by PCR NEGATIVE NEGATIVE Final   Influenza B by PCR NEGATIVE NEGATIVE Final    Comment: (NOTE) The Xpert Xpress SARS-CoV-2/FLU/RSV plus assay is intended as an aid in the diagnosis of influenza from Nasopharyngeal swab specimens and should not be used as a sole basis for treatment. Nasal washings and aspirates are unacceptable for Xpert Xpress SARS-CoV-2/FLU/RSV testing.  Fact Sheet for Patients: EntrepreneurPulse.com.au  Fact Sheet for Healthcare Providers: IncredibleEmployment.be  This test is not yet approved or cleared by the [place] FDA and has been authorized for detection and/or diagnosis of SARS-CoV-2 by FDA under an Emergency Use Authorization (EUA). This EUA will remain in effect (meaning this test can be used) for the duration of the COVID-19 declaration under Section 564(b)(1) of the Act, 21 U.S.C. section 360bbb-3(b)(1), unless the authorization is terminated or revoked.  Performed at [facility], [address], [place], [place] [address]   Surgical pcr screen     Status: Abnormal   Collection Time: 02/02/21  7:21 AM   Specimen: Nasal Mucosa; Nasal Swab  Result Value Ref Range Status   MRSA, PCR NEGATIVE NEGATIVE Final    Comment: RCRV BERNARD BRANDI @1008  02/02/2021 BY ADGER J.   Staphylococcus aureus POSITIVE (A) NEGATIVE Final    Comment: RCRV BERNARD BRANDI @1008  02/02/2021 BY ADGER J (NOTE) The Xpert SA Assay (FDA approved for NASAL specimens in patients 65 years of age and older), is one component of a comprehensive surveillance program. It is not intended to diagnose infection nor to guide or monitor treatment. Performed at [facility], [address], [place], [place] [address]      Radiology Studies: DG Pelvis Portable  Result Date: 02/03/2021 CLINICAL DATA:  Left hip replacement. EXAM: PORTABLE PELVIS 1-2 VIEWS COMPARISON:  Left hip x-rays dated February 01, 2021.  FINDINGS: The left hip demonstrates a bipolar hemiarthroplasty without evidence of hardware failure or complication. There is no fracture or dislocation. The alignment is anatomic. Post-surgical changes noted in the surrounding soft tissues. IMPRESSION: 1. Left hip bipolar hemiarthroplasty without acute postoperative complication. Electronically Signed   By: Titus Dubin M.D.   On: 02/03/2021 14:43   DG CHEST PORT 1 VIEW  Result Date: 02/04/2021 CLINICAL DATA:  Fever EXAM: PORTABLE CHEST 1 VIEW COMPARISON:  07/04/2017 FINDINGS: The cardiomediastinal silhouette is unchanged in contour. No pleural effusion. No pneumothorax. No acute pleuroparenchymal abnormality. Visualized abdomen is unremarkable. IMPRESSION: No acute cardiopulmonary abnormality. Electronically Signed   By: Valentino Saxon M.D.   On: 02/04/2021 14:51    Scheduled Meds:  aspirin EC  81 mg Oral BID   atorvastatin  10 mg Oral Daily   Chlorhexidine Gluconate Cloth  6 each Topical Q0600   citalopram  20 mg Oral QHS   docusate sodium  100 mg Oral BID   insulin aspart  0-20 Units  Subcutaneous TID WC   insulin aspart  0-5 Units Subcutaneous QHS   insulin detemir  50 Units Subcutaneous QHS   Ipratropium-Albuterol  2 puff Inhalation BID   lamoTRIgine  100 mg Oral Daily   mupirocin ointment  1 application Nasal BID   pantoprazole  40 mg Oral Daily   risperiDONE  0.5 mg Oral QHS   tamsulosin  0.4 mg Oral Daily   traZODone  100 mg Oral QHS   Continuous Infusions:  methocarbamol (ROBAXIN) IV       LOS: 3 days    Time spent: 35 minutes    Barton Dubois, MD [facility]   To contact the attending provider between 7A-7P or the covering provider during after hours 7P-7A, please log into the web site [email or web address] and access using universal [place] password for that web site. If you do not have the password, please call the hospital operator.  02/04/2021, 2:54 PM

## 2021-02-04 NOTE — Progress Notes (Signed)
   02/04/21 1529  Vitals  Temp 99.1 F (37.3 C)  Temp Source Axillary  BP (!) 98/59  MAP (mmHg) 72  BP Location Right Arm  BP Method Automatic  Pulse Rate 98  Pulse Rate Source Dinamap  Resp 18  Level of Consciousness  Level of Consciousness Alert  MEWS COLOR  MEWS Score Color Green  Oxygen Therapy  SpO2 100 %  O2 Device Room Air  MEWS Score  MEWS Temp 0  MEWS Systolic 1  MEWS Pulse 0  MEWS RR 0  MEWS LOC 0  MEWS Score 1   Pts vitals after 500NS bolus

## 2021-02-04 NOTE — Evaluation (Signed)
Physical Therapy Evaluation Patient Details Name: Douglas Cook MRN: [id number] DOB: May 22, 1945 Today's Date: 02/04/2021  History of Present Illness  Douglas Cook  is a 75 y.o. male, with history of anemia of chronic disease, chronic kidney disease stage III, COPD, diabetes mellitus, hyperlipidemia, reflux, and more presents the ED with a chief complaint of knee pain.  Patient is coming from a skilled nursing facility.  They had a fire drill today, and he was outside trying to move objects out of the way, when he tripped and fell and landed on his left knee.  Patient has a difficult time remembering what happened, this is likely his baseline.  At first patient said he could not really tell me why he was here, and when he was reminded that he fell he was able to provide some of the details.  Patient reports that prior to falling he had no palpitations, no chest pain, no dizziness.  He did feel short of breath but attributes that to exerting himself.  He did not hit his head.  He thinks he did pass out, since he cannot remember the event very well.  The way he describes it though, he was aware of everything going around him the whole time.  Patient reports that he initially did not feel the knee pain that was sharp.  Rest improved it, attempting to weight-bear made it worse.  Left knee scans did not show any acute abnormalities.  As he was being discharged he mentioned left hip pain.  Imaging of left hip pain shows left old fracture.   Clinical Impression   Patient demonstrates somnolence/lethargy but is easily aroused and able to follow one-step directions with max verbal/tactile cues for mobility sequence.  Required total assistance for rolling and achieving supine to sit and sit to supine.  Once sitting EOB demonstrates generalized weakness and difficulty maintaining upright trunk posture requiring mod A to maintain static sitting balance.  Attempted sit to stand transfer from elevated EOB but weakness  and difficulty in following directions limited success of mobility trial.  Continued PT sessions indicated to progress functional mobility to reduce level of assistance from caregivers and train/instruct patient/caregivers in safe mobility techniques to reduce risk for falls and sequelae of immobility.        Recommendations for follow up therapy are one component of a multi-disciplinary discharge planning process, led by the attending physician.  Recommendations may be updated based on patient status, additional functional criteria and insurance authorization.  Follow Up Recommendations Skilled nursing-short term rehab (<3 hours/day)    Assistance Recommended at Discharge Frequent or constant Supervision/Assistance  Functional Status Assessment Patient has had a recent decline in their functional status and demonstrates the ability to make significant improvements in function in a reasonable and predictable amount of time.  Equipment Recommendations  Rolling walker (2 wheels);Wheelchair (measurements PT)    Recommendations for Other Services       Precautions / Restrictions Precautions Precautions: Fall;Posterior Hip Precaution Booklet Issued: No Precaution Comments: left hemiarthroplasty Restrictions Weight Bearing Restrictions: Yes LLE Weight Bearing: Weight bearing as tolerated      Mobility  Bed Mobility Overal bed mobility: Needs Assistance Bed Mobility: Rolling;Supine to Sit;Sit to Supine Rolling: Total assist   Supine to sit: Total assist Sit to supine: Total assist     Patient Response: Flat affect  Transfers Overall transfer level: Needs assistance   Transfers: Sit to/from Stand;Bed to chair/wheelchair/BSC Sit to Stand: Total assist  Ambulation/Gait Ambulation/Gait assistance: Total assist   Assistive device: Rolling walker (2 wheels)            Stairs            Wheelchair Mobility    Modified Rankin (Stroke Patients  Only)       Balance Overall balance assessment: Needs assistance Sitting-balance support: Bilateral upper extremity supported;Feet supported Sitting balance-Leahy Scale: Fair   Postural control: Posterior lean   Standing balance-Leahy Scale: Zero                               Pertinent Vitals/Pain Pain Assessment: Faces Faces Pain Scale: Hurts little more Pain Location: LLE with active/passive movements Pain Intervention(s): Limited activity within patient's tolerance    Home Living Family/patient expects to be discharged to:: Skilled nursing facility                        Prior Function Prior Level of Function : Needs assist  Cognitive Assist : ADLs (cognitive)   ADLs (Cognitive):  (unknown) Physical Assist : ADLs (physical)             Hand Dominance        Extremity/Trunk Assessment   Upper Extremity Assessment Upper Extremity Assessment: Generalized weakness    Lower Extremity Assessment Lower Extremity Assessment: Generalized weakness       Communication      Cognition Arousal/Alertness: Lethargic Behavior During Therapy: WFL for tasks assessed/performed Overall Cognitive Status: History of cognitive impairments - at baseline                                          General Comments      Exercises General Exercises - Lower Extremity Ankle Circles/Pumps: AAROM;Both;20 reps Heel Slides: PROM;Both;20 reps Hip ABduction/ADduction: PROM;Left;20 reps   Assessment/Plan    PT Assessment Patient needs continued PT services  PT Problem List Decreased strength;Decreased range of motion;Decreased activity tolerance;Decreased balance;Decreased knowledge of use of DME;Decreased mobility;Pain       PT Treatment Interventions DME instruction;Gait training;Functional mobility training;Therapeutic activities;Patient/family education;Neuromuscular re-education;Balance training;Therapeutic exercise;Wheelchair mobility  training;Manual techniques    PT Goals (Current goals can be found in the Care Plan section)  Acute Rehab PT Goals Patient Stated Goal: presumably return to prior living environment PT Goal Formulation: Patient unable to participate in goal setting Time For Goal Achievement: 02/11/21 Potential to Achieve Goals: Fair    Frequency Min 3X/week   Barriers to discharge Other (comment) current level of assistance    Co-evaluation               AM-PAC PT "6 Clicks" Mobility  Outcome Measure Help needed turning from your back to your side while in a flat bed without using bedrails?: Total Help needed moving from lying on your back to sitting on the side of a flat bed without using bedrails?: Total Help needed moving to and from a bed to a chair (including a wheelchair)?: Total Help needed standing up from a chair using your arms (e.g., wheelchair or bedside chair)?: Total Help needed to walk in hospital room?: Total Help needed climbing 3-5 steps with a railing? : Total 6 Click Score: 6    End of Session Equipment Utilized During Treatment: Gait belt Activity Tolerance: Patient limited by fatigue;Patient limited by lethargy  Patient left: in bed;with call bell/phone within reach;with bed alarm set;with SCD's reapplied Nurse Communication: Mobility status PT Visit Diagnosis: Other abnormalities of gait and mobility (R26.89);Muscle weakness (generalized) (M62.81);Difficulty in walking, not elsewhere classified (R26.2)    Time: 5852-7782 PT Time Calculation (min) (ACUTE ONLY): 30 min   Charges:   PT Evaluation $PT Eval Moderate Complexity: 1 Mod PT Treatments $Therapeutic Exercise: 8-22 mins $Therapeutic Activity: 8-22 mins       10:18 AM, 02/04/21 M. Shary Decamp, PT, DPT Physical Therapist- [place] Office Number: [phone]

## 2021-02-04 NOTE — Progress Notes (Signed)
   02/04/21 1400  Vitals  Temp (!) 102.1 F (38.9 C)  Temp Source Rectal  BP (!) 95/56  MAP (mmHg) 68  BP Location Right Arm  BP Method Automatic  Patient Position (if appropriate) Lying  Pulse Rate (!) 106  Pulse Rate Source Dinamap  Level of Consciousness  Level of Consciousness Alert  MEWS COLOR  MEWS Score Color Red  MEWS Score  MEWS Temp 2  MEWS Systolic 1  MEWS Pulse 1  MEWS RR 0  MEWS LOC 0  MEWS Score 4  Provider Notification  Provider Name/Title Vassie Loll MD  Date Provider Notified 02/04/21  Time Provider Notified 1406  Notification Type Page  Notification Reason Other (Comment) (Red MEWS)  Provider response See new orders  Date of Provider Response 02/04/21  Time of Provider Response 1407

## 2021-02-04 NOTE — Progress Notes (Addendum)
Pt temp 99.9 oral and 102.1 axillary. MD notified PRN tylenol given. Pt is lethargic but is easy to arouse. Pt has not eaten breakfast or lunch at this time. Nurse is offering plenty of fluids.

## 2021-02-05 LAB — BASIC METABOLIC PANEL
Anion gap: 8 (ref 5–15)
BUN: 26 mg/dL — ABNORMAL HIGH (ref 8–23)
CO2: 25 mmol/L (ref 22–32)
Calcium: 8.2 mg/dL — ABNORMAL LOW (ref 8.9–10.3)
Chloride: 99 mmol/L (ref 98–111)
Creatinine, Ser: 1.56 mg/dL — ABNORMAL HIGH (ref 0.61–1.24)
GFR, Estimated: 47 mL/min — ABNORMAL LOW (ref >60)
Glucose, Bld: 264 mg/dL — ABNORMAL HIGH (ref 70–99)
Potassium: 4.4 mmol/L (ref 3.5–5.1)
Sodium: 132 mmol/L — ABNORMAL LOW (ref 135–145)

## 2021-02-05 LAB — GLUCOSE, CAPILLARY
Glucose-Capillary: 225 mg/dL — ABNORMAL HIGH (ref 70–99)
Glucose-Capillary: 222 mg/dL — ABNORMAL HIGH (ref 70–99)
Glucose-Capillary: 167 mg/dL — ABNORMAL HIGH (ref 70–99)
Glucose-Capillary: 184 mg/dL — ABNORMAL HIGH (ref 70–99)

## 2021-02-05 LAB — CBC
HCT: 31.2 % — ABNORMAL LOW (ref 39.0–52.0)
Hemoglobin: 10.1 g/dL — ABNORMAL LOW (ref 13.0–17.0)
MCH: 30.1 pg (ref 26.0–34.0)
MCHC: 32.4 g/dL (ref 30.0–36.0)
MCV: 92.9 fL (ref 80.0–100.0)
Platelets: 161 10*3/uL (ref 150–400)
RBC: 3.36 MIL/uL — ABNORMAL LOW (ref 4.22–5.81)
RDW: 13.5 % (ref 11.5–15.5)
WBC: 9.8 10*3/uL (ref 4.0–10.5)
nRBC: 0 % (ref 0.0–0.2)

## 2021-02-05 MED ORDER — SODIUM CHLORIDE 0.9 % IV SOLN
1.0000 g | INTRAVENOUS | Status: DC
  Administered 2021-02-05 – 2021-02-07 (×3): 1 g via INTRAVENOUS
  Filled 2021-02-05 (×3): qty 10

## 2021-02-05 NOTE — TOC Initial Note (Signed)
Transition of Care [facility]) - Initial/Assessment Note    Patient Details  Name: Douglas Cook MRN: [id number] Date of Birth: 11/29/1945  Transition of Care [facility]) CM/SW Contact:    Natasha Bence, LCSW Phone Number: 02/05/2021, 4:06 PM  Clinical Narrative:                 Patient is a 75 year old male admitted for hip fracture. CSW conducted initial assessment. Patient is ambulatory at baseline. CSW contacted patient's sister to inquire about SNF referral. Patient's sister reported that they are agreeable to SNF. CSW completed FL2 and referred patient to [facility] SNF's. Patient's sister reported that patient is vaccinated for Covid 19 but is unsure of patient's last booster. TOC to follow.   Expected Discharge Plan: Skilled Nursing Facility Barriers to Discharge: Continued Medical Work up   Patient Goals and CMS Choice Patient states their goals for this hospitalization and ongoing recovery are:: Rehab with SNF CMS Medicare.gov Compare Post Acute Care list provided to:: Patient Choice offered to / list presented to : Patient  Expected Discharge Plan and Services Expected Discharge Plan: [place]       Living arrangements for the past 2 months: [place]                                      Prior Living Arrangements/Services Living arrangements for the past 2 months: [place] Lives with:: Facility Resident Patient language and need for interpreter reviewed:: Yes Do you feel safe going back to the place where you live?: Yes      Need for Family Participation in Patient Care: Yes (Comment) Care giver support system in place?: Yes (comment)   Criminal Activity/Legal Involvement Pertinent to Current Situation/Hospitalization: No - Comment as needed  Activities of Daily Living Home Assistive Devices/Equipment: Grab bars around toilet, Raised toilet seat with rails ADL Screening (condition at time of admission) Patient's cognitive  ability adequate to safely complete daily activities?: No Is the patient deaf or have difficulty hearing?: No Does the patient have difficulty seeing, even when wearing glasses/contacts?: No Does the patient have difficulty concentrating, remembering, or making decisions?: Yes Patient able to express need for assistance with ADLs?: Yes Does the patient have difficulty dressing or bathing?: Yes Independently performs ADLs?: No Communication: Independent Dressing (OT): Needs assistance Is this a change from baseline?: Pre-admission baseline Grooming: Needs assistance Is this a change from baseline?: Pre-admission baseline Feeding: Independent Bathing: Needs assistance Is this a change from baseline?: Pre-admission baseline Toileting: Needs assistance Is this a change from baseline?: Pre-admission baseline In/Out Bed: Needs assistance Is this a change from baseline?: Pre-admission baseline Walks in Home: Needs assistance Is this a change from baseline?: Pre-admission baseline Does the patient have difficulty walking or climbing stairs?: Yes Weakness of Legs: Left Weakness of Arms/Hands: None  Permission Sought/Granted Permission sought to share information with : Family Supports Permission granted to share information with : Yes, Verbal Permission Granted  Share Information with NAME: Coffman,Carolyn  Permission granted to share info w AGENCY: Local SNF's  Permission granted to share info w Relationship: (Sister)  Permission granted to share info w Contact Information: [phone]  Emotional Assessment     Affect (typically observed): Accepting, Adaptable Orientation: : Oriented to Self Alcohol / Substance Use: Not Applicable Psych Involvement: No (comment)  Admission diagnosis:  Hip fracture ([place]) [S72.009A] Pain [R52] Contusion of  left knee, initial encounter [S80.02XA] Fall, initial encounter [W19.XXXA] Closed fracture of neck of left femur, initial encounter (HCC)  [S72.002A] Patient Active Problem List   Diagnosis Date Noted   Hip fracture (HCC) 02/01/2021   AKI (acute kidney injury) (HCC) 07/04/2017   CKD (chronic kidney disease), stage III (HCC) 07/04/2017   COPD (chronic obstructive pulmonary disease) (HCC) 07/04/2017   Type 2 diabetes mellitus (HCC) 07/04/2017   GERD (gastroesophageal reflux disease) 07/04/2017   Hyperlipidemia 07/04/2017   Depression 07/04/2017   PCP:  Ignatius Specking, MD Pharmacy:  No Pharmacies Listed    Social Determinants of Health (SDOH) Interventions    Readmission Risk Interventions No flowsheet data found.

## 2021-02-05 NOTE — NC FL2 (Signed)
[place] MEDICAID FL2 LEVEL OF CARE SCREENING TOOL     IDENTIFICATION  Patient Name: Douglas Cook Birthdate: 04-20-1945 Sex: male Admission Date (Current Location): 02/01/2021  [facility] and [place] Number:  [employer] and Address:  [facility] [address] [place]      Provider Number: [id number]  Attending Physician Name and Address:  Barton Dubois, MD  Relative Name and Phone Number:  Coffman,Carolyn (Sister)   [phone]    Current Level of Care: SNF Recommended Level of Care: Skilled Nursing Facility Prior Approval Number:    Date Approved/Denied:   PASRR Number: Pending  Discharge Plan: SNF    Current Diagnoses: Patient Active Problem List   Diagnosis Date Noted   Hip fracture ([place]) 02/01/2021   AKI (acute kidney injury) ([place]) 07/04/2017   CKD (chronic kidney disease), stage III ([place]) 07/04/2017   COPD (chronic obstructive pulmonary disease) ([place]) 07/04/2017   Type 2 diabetes mellitus ([place]) 07/04/2017   GERD (gastroesophageal reflux disease) 07/04/2017   Hyperlipidemia 07/04/2017   Depression 07/04/2017    Orientation RESPIRATION BLADDER Height & Weight     Self  Normal Incontinent Weight: 187 lb 6.3 oz (85 kg) Height:  6' (182.9 cm)  BEHAVIORAL SYMPTOMS/MOOD NEUROLOGICAL BOWEL NUTRITION STATUS      Incontinent Diet (Diet Carb Modified Fluid consistency: Thin; Room service appropriate? Yes)  AMBULATORY STATUS COMMUNICATION OF NEEDS Skin   Extensive Assist Verbally Surgical wounds (Hip)                       Personal Care Assistance Level of Assistance  Dressing, Feeding, Bathing Bathing Assistance: Maximum assistance Feeding assistance: Independent Dressing Assistance: Limited assistance     Functional Limitations Info  Sight, Hearing, Speech Sight Info: Adequate Hearing Info: Adequate Speech Info: Adequate    SPECIAL CARE FACTORS FREQUENCY  PT (By licensed PT), OT (By licensed OT)     PT  Frequency: 5x OT Frequency: 5x            Contractures Contractures Info: Not present    Additional Factors Info  Code Status, Allergies Code Status Info: Full Allergies Info: N/A           Current Medications (02/05/2021):  This is the current hospital active medication list Current Facility-Administered Medications  Medication Dose Route Frequency Provider Last Rate Last Admin   acetaminophen (TYLENOL) tablet 650 mg  650 mg Oral Q6H PRN Carole Civil, MD   650 mg at 02/04/21 1227   Or   acetaminophen (TYLENOL) suppository 650 mg  650 mg Rectal Q6H PRN Carole Civil, MD   650 mg at 02/05/21 0843   alum & mag hydroxide-simeth (MAALOX/MYLANTA) 200-200-20 MG/5ML suspension 30 mL  30 mL Oral Q4H PRN Carole Civil, MD       aspirin EC tablet 81 mg  81 mg Oral BID Barton Dubois, MD   81 mg at 02/05/21 0944   atorvastatin (LIPITOR) tablet 10 mg  10 mg Oral Daily Carole Civil, MD   10 mg at 02/05/21 0944   cefTRIAXone (ROCEPHIN) 1 g in sodium chloride 0.9 % 100 mL IVPB  1 g Intravenous Q24H Barton Dubois, MD 200 mL/hr at 02/05/21 1208 1 g at 02/05/21 1208   Chlorhexidine Gluconate Cloth 2 % PADS 6 each  6 each Topical Q0600 Carole Civil, MD   6 each at 02/05/21 0659   citalopram (CELEXA) tablet 20 mg  20 mg  Oral QHS Vickki Hearing, MD   20 mg at 02/04/21 2211   docusate sodium (COLACE) capsule 100 mg  100 mg Oral BID Vickki Hearing, MD   100 mg at 02/05/21 0944   HYDROcodone-acetaminophen (NORCO/VICODIN) 5-325 MG per tablet 1-2 tablet  1-2 tablet Oral Q4H PRN Vickki Hearing, MD       insulin aspart (novoLOG) injection 0-20 Units  0-20 Units Subcutaneous TID WC Vickki Hearing, MD   7 Units at 02/05/21 1306   insulin aspart (novoLOG) injection 0-5 Units  0-5 Units Subcutaneous QHS Vickki Hearing, MD   2 Units at 02/04/21 2226   insulin detemir (LEVEMIR) injection 50 Units  50 Units Subcutaneous QHS Vickki Hearing, MD   50 Units  at 02/04/21 2215   Ipratropium-Albuterol (COMBIVENT) respimat 2 puff  2 puff Inhalation BID Vickki Hearing, MD   2 puff at 02/05/21 4010   lamoTRIgine (LAMICTAL) tablet 100 mg  100 mg Oral Daily Vickki Hearing, MD   100 mg at 02/05/21 0944   menthol-cetylpyridinium (CEPACOL) lozenge 3 mg  1 lozenge Oral PRN Vickki Hearing, MD       Or   phenol (CHLORASEPTIC) mouth spray 1 spray  1 spray Mouth/Throat PRN Vickki Hearing, MD       methocarbamol (ROBAXIN) tablet 500 mg  500 mg Oral Q6H PRN Vickki Hearing, MD       Or   methocarbamol (ROBAXIN) 500 mg in dextrose 5 % 50 mL IVPB  500 mg Intravenous Q6H PRN Vickki Hearing, MD       metoCLOPramide (REGLAN) tablet 5-10 mg  5-10 mg Oral Q8H PRN Vickki Hearing, MD       Or   metoCLOPramide (REGLAN) injection 5-10 mg  5-10 mg Intravenous Q8H PRN Vickki Hearing, MD       morphine 2 MG/ML injection 2 mg  2 mg Intravenous Q2H PRN Vickki Hearing, MD   2 mg at 02/02/21 0932   mupirocin ointment (BACTROBAN) 2 % 1 application  1 application Nasal BID Vickki Hearing, MD   1 application at 02/05/21 0945   ondansetron (ZOFRAN) tablet 4 mg  4 mg Oral Q6H PRN Vickki Hearing, MD       Or   ondansetron [facility]) injection 4 mg  4 mg Intravenous Q6H PRN Vickki Hearing, MD       pantoprazole (PROTONIX) EC tablet 40 mg  40 mg Oral Daily Vickki Hearing, MD   40 mg at 02/05/21 0944   risperiDONE (RISPERDAL) tablet 0.5 mg  0.5 mg Oral QHS Vickki Hearing, MD   0.5 mg at 02/04/21 2212   senna-docusate (Senokot-S) tablet 1 tablet  1 tablet Oral QHS PRN Vickki Hearing, MD       tamsulosin [facility]) capsule 0.4 mg  0.4 mg Oral Daily Vickki Hearing, MD   0.4 mg at 02/05/21 0944   traMADol (ULTRAM) tablet 50 mg  50 mg Oral Q8H PRN Vassie Loll, MD       traZODone (DESYREL) tablet 100 mg  100 mg Oral QHS Vickki Hearing, MD   100 mg at 02/04/21 2211     Discharge Medications: Please see discharge  summary for a list of discharge medications.  Relevant Imaging Results:  Relevant Lab Results:   Additional Information PT SSN [id number]  Barry Brunner, LCSW

## 2021-02-05 NOTE — Progress Notes (Signed)
PROGRESS NOTE    Douglas Cook  [id number] DOB: [birth year] DOA: 02/01/2021 PCP: Glenda Chroman, MD    Chief Complaint  Patient presents with   Fall    Brief Narrative:  As per H&P written by Dr. Clearence Ped on 02/02/21 [name]  is a 75 y.o. male, with history of anemia of chronic disease, chronic kidney disease stage III, COPD, diabetes mellitus, hyperlipidemia, reflux, and more presents the ED with a chief complaint of knee pain.  Patient is coming from a skilled nursing facility.  They had a fire drill today, and he was outside trying to move objects out of the way, when he tripped and fell and landed on his left knee.  Patient has a difficult time remembering what happened, this is likely his baseline.  At first patient said he could not really tell me why he was here, and when he was reminded that he fell he was able to provide some of the details.  Patient reports that prior to falling he had no palpitations, no chest pain, no dizziness.  He did feel short of breath but attributes that to exerting himself.  He did not hit his head.  He thinks he did pass out, since he cannot remember the event very well.  The way he describes it though, he was aware of everything going around him the whole time.  Patient reports that he initially did not feel the knee pain that was sharp.  Rest improved it, attempting to weight-bear made it worse.  Left knee scans did not show any acute abnormalities.  As he was being discharged he mentioned left hip pain.  Imaging of left hip pain shows left closed fracture.   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 -Following recommendations by orthopedic service patient will be using aspirin twice a day for DVT prophylaxis strategy and weightbearing as tolerated. -Physical therapy has seen patient and is recommending skilled nursing facility at discharge. -TOC has been made  aware to assist with placement. -Continue supportive care  2-COPD (chronic obstructive pulmonary disease) ([place]) -Stable and currently no wheezing or demonstrating exacerbation. -Continue as needed bronchodilators.  3-Type 2 diabetes mellitus (HCC) -Continue sliding scale and reduced dose of basal insulin. -Follow CBGs and adjust hypoglycemic regimen as required.  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 -Continue to follow renal function trend.  5-gastroesophageal reflux disease -Continue PPI  6-hypertension/soft blood pressure -Probably in the setting of analgesics usage -Holding antihypertensive agents at this time and closely watching vital signs.  7-hyperlipidemia -Continue statins  8-history of BPH -No complaining of urinary retention -Continue the use of Flomax.  9-fever/UTI -Patient has continued spiking fever -Urinalysis suggesting the presence of UTI -Normal WBCs on examination and clear chest x-ray. -IV Rocephin will be initiated -Continue as needed antipyretics.   DVT prophylaxis: SCDs and ASA BID now. Code Status: (Full/Partial - specify details) Family Communication: No family at bedside. Disposition:   Status is: Inpatient  Remains inpatient appropriate because: In need of skilled nursing facility for rehabilitation and conditioning; patient has been seen by physical therapy.  Orthopedic service recommended weightbearing as tolerated.  Patient spiking fevers and getting work-up to rule out infections.   Consultants:  Orthopedic service  Procedures:  S/p eft hemiarthroplasty later today (02/03/2021).  Antimicrobials:  None   Subjective: Patient continues spiking fever; no chest pain, no shortness of breath, no significant discomfort on his left hip.  Evaluated  by physical therapy with recommendation for a skilled nursing facility at discharge.    Objective: Vitals:   02/05/21 0834 02/05/21 0842 02/05/21 0918 02/05/21 1012  BP: (!) 110/56   (!) 108/59  Pulse: (!) 109   92  Resp: 15   12  Temp: 100.2 F (37.9 C) (!) 101.7 F (38.7 C)  99.4 F (37.4 C)  TempSrc: Oral Rectal  Oral  SpO2: 100%  96%   Weight:      Height:        Intake/Output Summary (Last 24 hours) at 02/05/2021 1042 Last data filed at 02/04/2021 1700 Gross per 24 hour  Intake 300 ml  Output 300 ml  Net 0 ml   Filed Weights   02/01/21 2244 02/03/21 1042  Weight: 85.2 kg 85 kg    Examination: General exam: Alert, awake, oriented x 3; reports no significant pain in his left hip while at rest.  Has continue spiking fever.  No shortness of breath or chest pain. Respiratory system: Clear to auscultation. Respiratory effort normal.  Good saturation on room air. Cardiovascular system:RRR. No murmurs, rubs, gallops.  No JVD. Gastrointestinal system: Abdomen is nondistended, soft and nontender. No organomegaly or masses felt. Normal bowel sounds heard. Central nervous system: No focal neurologic deficit. Extremities: No cyanosis or clubbing. Skin: No petechiae.  Left lateral hip incision clean, dry and with clean dressings. Psychiatry: Mood & affect appropriate.     Data Reviewed: I have personally reviewed following labs and imaging studies  CBC: Recent Labs  Lab 02/01/21 2001 02/02/21 0215 02/04/21 0543 02/05/21 0402  WBC 11.3* 10.5 10.4 9.8  NEUTROABS 7.5 7.9*  --   --   HGB 11.4* 11.9* 11.2* 10.1*  HCT 34.3* 36.7* 33.5* 31.2*  MCV 93.5 93.1 93.1 92.9  PLT 211 213 168 161    Basic Metabolic Panel: Recent Labs  Lab 02/01/21 2001 02/02/21 0215 02/04/21 0543 02/05/21 0402  NA 139 140 134* 132*  K 4.7 4.8 4.4 4.4  CL 104 103 99 99  CO2 26 27 26 25   GLUCOSE 147* 183* 227* 264*  BUN 35* 30* 20 26*  CREATININE 1.64* 1.47* 1.36* 1.56*  CALCIUM 9.4 9.4  8.5* 8.2*    GFR: Estimated Creatinine Clearance: 46.3 mL/min (A) (by C-G formula based on SCr of 1.56 mg/dL (H)).  Liver Function Tests: Recent Labs  Lab 02/02/21 0215  AST 16  ALT 19  ALKPHOS 38  BILITOT 0.6  PROT 6.7  ALBUMIN 3.9    CBG: Recent Labs  Lab 02/04/21 0743 02/04/21 1148 02/04/21 1638 02/04/21 2154 02/05/21 0724  GLUCAP 214* 189* 142* 236* 225*     Recent Results (from the past 240 hour(s))  Resp Panel by RT-PCR (Flu A&B, Covid) Nasopharyngeal Swab     Status: None   Collection Time: 02/01/21  8:14 PM   Specimen: Nasopharyngeal Swab; Nasopharyngeal(NP) swabs in vial transport medium  Result Value Ref Range Status   SARS Coronavirus 2 by RT PCR NEGATIVE NEGATIVE Final    Comment: (NOTE) SARS-CoV-2 target nucleic acids are NOT DETECTED.  The SARS-CoV-2 RNA is generally detectable in upper respiratory specimens during the acute phase of infection. The lowest concentration of SARS-CoV-2 viral copies this assay can detect is 138 copies/mL. A negative result does not preclude SARS-Cov-2 infection and should not be used as the sole basis  for treatment or other patient management decisions. A negative result may occur with  improper specimen collection/handling, submission of specimen other than nasopharyngeal swab, presence of viral mutation(s) within the areas targeted by this assay, and inadequate number of viral copies(<138 copies/mL). A negative result must be combined with clinical observations, patient history, and epidemiological information. The expected result is Negative.  Fact Sheet for Patients:  EntrepreneurPulse.com.au  Fact Sheet for Healthcare Providers:  IncredibleEmployment.be  This test is no t yet approved or cleared by the [place] FDA and  has been authorized for detection and/or diagnosis of SARS-CoV-2 by FDA under an Emergency Use Authorization (EUA). This EUA will remain  in effect  (meaning this test can be used) for the duration of the COVID-19 declaration under Section 564(b)(1) of the Act, 21 U.S.C.section 360bbb-3(b)(1), unless the authorization is terminated  or revoked sooner.       Influenza A by PCR NEGATIVE NEGATIVE Final   Influenza B by PCR NEGATIVE NEGATIVE Final    Comment: (NOTE) The Xpert Xpress SARS-CoV-2/FLU/RSV plus assay is intended as an aid in the diagnosis of influenza from Nasopharyngeal swab specimens and should not be used as a sole basis for treatment. Nasal washings and aspirates are unacceptable for Xpert Xpress SARS-CoV-2/FLU/RSV testing.  Fact Sheet for Patients: EntrepreneurPulse.com.au  Fact Sheet for Healthcare Providers: IncredibleEmployment.be  This test is not yet approved or cleared by the [place] FDA and has been authorized for detection and/or diagnosis of SARS-CoV-2 by FDA under an Emergency Use Authorization (EUA). This EUA will remain in effect (meaning this test can be used) for the duration of the COVID-19 declaration under Section 564(b)(1) of the Act, 21 U.S.C. section 360bbb-3(b)(1), unless the authorization is terminated or revoked.  Performed at [facility], [address], [place], [place] [address]   Surgical pcr screen     Status: Abnormal   Collection Time: 02/02/21  7:21 AM   Specimen: Nasal Mucosa; Nasal Swab  Result Value Ref Range Status   MRSA, PCR NEGATIVE NEGATIVE Final    Comment: RCRV BERNARD BRANDI @1008  02/02/2021 BY ADGER J.   Staphylococcus aureus POSITIVE (A) NEGATIVE Final    Comment: RCRV BERNARD BRANDI @1008  02/02/2021 BY ADGER J (NOTE) The Xpert SA Assay (FDA approved for NASAL specimens in patients 46 years of age and older), is one component of a comprehensive surveillance program. It is not intended to diagnose infection nor to guide or monitor treatment. Performed at [facility], [address] [place], [place] [address]   Culture,  blood (routine x 2)     Status: None (Preliminary result)   Collection Time: 02/04/21  2:27 PM   Specimen: Left Antecubital; Blood  Result Value Ref Range Status   Specimen Description   Final    LEFT ANTECUBITAL BOTTLES DRAWN AEROBIC AND ANAEROBIC   Special Requests Blood Culture adequate volume  Final   Culture   Final    NO GROWTH < 24 HOURS Performed at [facility], [address], [place], [place] [address]    Report Status PENDING  Incomplete  Culture, blood (routine x 2)     Status: None (Preliminary result)   Collection Time: 02/04/21  2:32 PM   Specimen: BLOOD LEFT HAND  Result Value Ref Range Status   Specimen Description   Final    BLOOD LEFT HAND BOTTLES DRAWN AEROBIC AND ANAEROBIC   Special Requests Blood Culture adequate volume  Final   Culture   Final    NO  GROWTH < 24 HOURS Performed at [facility], [address], [place], [place] [address]    Report Status PENDING  Incomplete     Radiology Studies: DG Pelvis Portable  Result Date: 02/03/2021 CLINICAL DATA:  Left hip replacement. EXAM: PORTABLE PELVIS 1-2 VIEWS COMPARISON:  Left hip x-rays dated February 01, 2021. FINDINGS: The left hip demonstrates a bipolar hemiarthroplasty without evidence of hardware failure or complication. There is no fracture or dislocation. The alignment is anatomic. Post-surgical changes noted in the surrounding soft tissues. IMPRESSION: 1. Left hip bipolar hemiarthroplasty without acute postoperative complication. Electronically Signed   By: Titus Dubin M.D.   On: 02/03/2021 14:43   DG CHEST PORT 1 VIEW  Result Date: 02/04/2021 CLINICAL DATA:  Fever EXAM: PORTABLE CHEST 1 VIEW COMPARISON:  07/04/2017 FINDINGS: The cardiomediastinal silhouette is unchanged in contour. No pleural effusion. No pneumothorax. No acute pleuroparenchymal abnormality. Visualized abdomen is unremarkable. IMPRESSION: No acute cardiopulmonary abnormality. Electronically Signed   By: Valentino Saxon M.D.   On:  02/04/2021 14:51    Scheduled Meds:  aspirin EC  81 mg Oral BID   atorvastatin  10 mg Oral Daily   Chlorhexidine Gluconate Cloth  6 each Topical Q0600   citalopram  20 mg Oral QHS   docusate sodium  100 mg Oral BID   insulin aspart  0-20 Units Subcutaneous TID WC   insulin aspart  0-5 Units Subcutaneous QHS   insulin detemir  50 Units Subcutaneous QHS   Ipratropium-Albuterol  2 puff Inhalation BID   lamoTRIgine  100 mg Oral Daily   mupirocin ointment  1 application Nasal BID   pantoprazole  40 mg Oral Daily   risperiDONE  0.5 mg Oral QHS   tamsulosin  0.4 mg Oral Daily   traZODone  100 mg Oral QHS   Continuous Infusions:  cefTRIAXone (ROCEPHIN)  IV     methocarbamol (ROBAXIN) IV       LOS: 4 days    Time spent: 35 minutes   Barton Dubois, MD [facility]   To contact the attending provider between 7A-7P or the covering provider during after hours 7P-7A, please log into the web site [email or web address] and access using universal [place] password for that web site. If you do not have the password, please call the hospital operator.  02/05/2021, 10:42 AM

## 2021-02-05 NOTE — Progress Notes (Signed)
   02/05/21 0842  Vitals  Temp (!) 101.7 F (38.7 C)  Temp Source Rectal  MEWS COLOR  MEWS Score Color Yellow  MEWS Score  MEWS Temp 2  MEWS Systolic 0  MEWS Pulse 1  MEWS RR 0  MEWS LOC 0  MEWS Score 3  Provider Notification  Provider Name/Title Madera,Carlos MD  Date Provider Notified 02/05/21  Time Provider Notified [phone]  Notification Type Page  Notification Reason  (yellow MEWS)  Provider response No new orders

## 2021-02-06 DIAGNOSIS — Z96642 Presence of left artificial hip joint: Secondary | ICD-10-CM

## 2021-02-06 DIAGNOSIS — N390 Urinary tract infection, site not specified: Secondary | ICD-10-CM

## 2021-02-06 LAB — URINE CULTURE
Culture: NO GROWTH
Special Requests: NORMAL

## 2021-02-06 LAB — CBC
HCT: 30.1 % — ABNORMAL LOW (ref 39.0–52.0)
Hemoglobin: 9.9 g/dL — ABNORMAL LOW (ref 13.0–17.0)
MCH: 30.1 pg (ref 26.0–34.0)
MCHC: 32.9 g/dL (ref 30.0–36.0)
MCV: 91.5 fL (ref 80.0–100.0)
Platelets: 177 10*3/uL (ref 150–400)
RBC: 3.29 MIL/uL — ABNORMAL LOW (ref 4.22–5.81)
RDW: 13.4 % (ref 11.5–15.5)
WBC: 9.5 10*3/uL (ref 4.0–10.5)
nRBC: 0 % (ref 0.0–0.2)

## 2021-02-06 LAB — GLUCOSE, CAPILLARY
Glucose-Capillary: 196 mg/dL — ABNORMAL HIGH (ref 70–99)
Glucose-Capillary: 244 mg/dL — ABNORMAL HIGH (ref 70–99)
Glucose-Capillary: 182 mg/dL — ABNORMAL HIGH (ref 70–99)

## 2021-02-06 NOTE — TOC Progression Note (Signed)
Transition of Care [facility]) - Progression Note    Patient Details  Name: Douglas Cook MRN: [id number] Date of Birth: 13-May-1945  Transition of Care [facility]) CM/SW Contact  Annice Needy, LCSW Phone Number: 02/06/2021, 11:18 AM  Clinical Narrative:    Per Dondra Spry at [facility] patient received COVID vaccinations on 04/27/19 and 05/25/19.   Expected Discharge Plan: Skilled Nursing Facility Barriers to Discharge: Continued Medical Work up  Expected Discharge Plan and Services Expected Discharge Plan: Skilled Nursing Facility       Living arrangements for the past 2 months: Assisted Living Facility                                       Social Determinants of Health (SDOH) Interventions    Readmission Risk Interventions No flowsheet data found.

## 2021-02-06 NOTE — TOC Progression Note (Signed)
Transition of Care [facility]) - Progression Note    Patient Details  Name: KAMERYN TISDEL MRN: [id number] Date of Birth: 1945-04-14  Transition of Care [facility]) CM/SW Contact  Annice Needy, LCSW Phone Number: 02/06/2021, 1:52 PM  Clinical Narrative:    Upon receipt of PASRR, patient will d/c to [facility].    Expected Discharge Plan: Skilled Nursing Facility Barriers to Discharge: Continued Medical Work up  Expected Discharge Plan and Services Expected Discharge Plan: Skilled Nursing Facility       Living arrangements for the past 2 months: Assisted Living Facility                                       Social Determinants of Health (SDOH) Interventions    Readmission Risk Interventions No flowsheet data found.

## 2021-02-06 NOTE — NC FL2 (Signed)
[place] MEDICAID FL2 LEVEL OF CARE SCREENING TOOL     IDENTIFICATION  Patient Name: Douglas Cook Birthdate: Sep 30, 1945 Sex: male Admission Date (Current Location): 02/01/2021  [facility] [place] Number:  [employer] and Address:  [place] [address], [place]      Provider Number: [id number]  Attending Physician Name and Address:  Barton Dubois, MD  Relative Name and Phone Number:  Knox Saliva (Sister)   [phone]    Current Level of Care: Hospital Recommended Level of Care: [place] Prior Approval Number:    Date Approved/Denied:   PASRR Number: Pending  Discharge Plan: SNF    Current Diagnoses: Patient Active Problem List   Diagnosis Date Noted   Hip fracture ([place]) 02/01/2021   AKI (acute kidney injury) ([place]) 07/04/2017   CKD (chronic kidney disease), stage III ([place]) 07/04/2017   COPD (chronic obstructive pulmonary disease) ([place]) 07/04/2017   Type 2 diabetes mellitus ([place]) 07/04/2017   GERD (gastroesophageal reflux disease) 07/04/2017   Hyperlipidemia 07/04/2017   Depression 07/04/2017    Orientation RESPIRATION BLADDER Height & Weight     Self  Normal Incontinent Weight: 187 lb 6.3 oz (85 kg) Height:  6' (182.9 cm)  BEHAVIORAL SYMPTOMS/MOOD NEUROLOGICAL BOWEL NUTRITION STATUS      Incontinent Diet (Diet Carb Modified Fluid consistency: Thin; Room service appropriate? Yes)  AMBULATORY STATUS COMMUNICATION OF NEEDS Skin   Extensive Assist Verbally Surgical wounds (Hip)                       Personal Care Assistance Level of Assistance  Dressing, Feeding, Bathing Bathing Assistance: Maximum assistance Feeding assistance: Independent Dressing Assistance: Limited assistance     Functional Limitations Info  Sight, Hearing, Speech Sight Info: Adequate Hearing Info: Adequate Speech Info: Adequate    SPECIAL CARE FACTORS FREQUENCY  PT (By licensed PT), OT (By licensed OT)      PT Frequency: 5x OT Frequency: 5x            Contractures Contractures Info: Not present    Additional Factors Info  Code Status, Allergies Code Status Info: Full Allergies Info: N/A           Current Medications (02/06/2021):  This is the current hospital active medication list Current Facility-Administered Medications  Medication Dose Route Frequency Provider Last Rate Last Admin   acetaminophen (TYLENOL) tablet 650 mg  650 mg Oral Q6H PRN Carole Civil, MD   650 mg at 02/04/21 1227   Or   acetaminophen (TYLENOL) suppository 650 mg  650 mg Rectal Q6H PRN Carole Civil, MD   650 mg at 02/05/21 0843   alum & mag hydroxide-simeth (MAALOX/MYLANTA) 200-200-20 MG/5ML suspension 30 mL  30 mL Oral Q4H PRN Carole Civil, MD       aspirin EC tablet 81 mg  81 mg Oral BID Barton Dubois, MD   81 mg at 02/06/21 0947   atorvastatin (LIPITOR) tablet 10 mg  10 mg Oral Daily Carole Civil, MD   10 mg at 02/06/21 0947   cefTRIAXone (ROCEPHIN) 1 g in sodium chloride 0.9 % 100 mL IVPB  1 g Intravenous Q24H Barton Dubois, MD 200 mL/hr at 02/06/21 1235 1 g at 02/06/21 1235   citalopram (CELEXA) tablet 20 mg  20 mg Oral QHS Carole Civil, MD   20 mg at 02/05/21 2130   docusate sodium (COLACE) capsule 100 mg  100 mg Oral BID  Vickki Hearing, MD   100 mg at 02/06/21 0947   HYDROcodone-acetaminophen (NORCO/VICODIN) 5-325 MG per tablet 1-2 tablet  1-2 tablet Oral Q4H PRN Vickki Hearing, MD   2 tablet at 02/06/21 0955   insulin aspart (novoLOG) injection 0-20 Units  0-20 Units Subcutaneous TID WC Vickki Hearing, MD   7 Units at 02/06/21 1236   insulin aspart (novoLOG) injection 0-5 Units  0-5 Units Subcutaneous QHS Vickki Hearing, MD   2 Units at 02/04/21 2226   insulin detemir (LEVEMIR) injection 50 Units  50 Units Subcutaneous QHS Vickki Hearing, MD   50 Units at 02/05/21 2131   Ipratropium-Albuterol (COMBIVENT) respimat 2 puff  2 puff Inhalation  BID Vickki Hearing, MD   2 puff at 02/06/21 0730   lamoTRIgine (LAMICTAL) tablet 100 mg  100 mg Oral Daily Vickki Hearing, MD   100 mg at 02/06/21 5465   menthol-cetylpyridinium (CEPACOL) lozenge 3 mg  1 lozenge Oral PRN Vickki Hearing, MD       Or   phenol (CHLORASEPTIC) mouth spray 1 spray  1 spray Mouth/Throat PRN Vickki Hearing, MD       methocarbamol (ROBAXIN) tablet 500 mg  500 mg Oral Q6H PRN Vickki Hearing, MD   500 mg at 02/06/21 0955   Or   methocarbamol (ROBAXIN) 500 mg in dextrose 5 % 50 mL IVPB  500 mg Intravenous Q6H PRN Vickki Hearing, MD       metoCLOPramide (REGLAN) tablet 5-10 mg  5-10 mg Oral Q8H PRN Vickki Hearing, MD       Or   metoCLOPramide (REGLAN) injection 5-10 mg  5-10 mg Intravenous Q8H PRN Vickki Hearing, MD       morphine 2 MG/ML injection 2 mg  2 mg Intravenous Q2H PRN Vickki Hearing, MD   2 mg at 02/02/21 0932   mupirocin ointment (BACTROBAN) 2 % 1 application  1 application Nasal BID Vickki Hearing, MD   1 application at 02/06/21 0947   ondansetron (ZOFRAN) tablet 4 mg  4 mg Oral Q6H PRN Vickki Hearing, MD       Or   ondansetron [facility]) injection 4 mg  4 mg Intravenous Q6H PRN Vickki Hearing, MD       pantoprazole (PROTONIX) EC tablet 40 mg  40 mg Oral Daily Vickki Hearing, MD   40 mg at 02/06/21 0947   risperiDONE (RISPERDAL) tablet 0.5 mg  0.5 mg Oral QHS Vickki Hearing, MD   0.5 mg at 02/05/21 2131   senna-docusate (Senokot-S) tablet 1 tablet  1 tablet Oral QHS PRN Vickki Hearing, MD       tamsulosin [facility]) capsule 0.4 mg  0.4 mg Oral Daily Vickki Hearing, MD   0.4 mg at 02/06/21 0948   traMADol (ULTRAM) tablet 50 mg  50 mg Oral Q8H PRN Vassie Loll, MD       traZODone (DESYREL) tablet 100 mg  100 mg Oral QHS Vickki Hearing, MD   100 mg at 02/05/21 2130     Discharge Medications: Please see discharge summary for a list of discharge medications.  Relevant Imaging  Results:  Relevant Lab Results:   Additional Information PT SSN [id number]. Patient was vaccinated for COVID on 04/27/19 and 05/25/19  Angelino Rumery, Juleen China, LCSW

## 2021-02-06 NOTE — Progress Notes (Signed)
Physical Therapy Treatment Patient Details Name: Douglas Cook MRN: [id number] DOB: March 22, 1946 Today's Date: 02/06/2021   History of Present Illness Douglas Cook  is a 75 y.o. male, with history of anemia of chronic disease, chronic kidney disease stage III, COPD, diabetes mellitus, hyperlipidemia, reflux, and more presents the ED with a chief complaint of knee pain.  Patient is coming from a skilled nursing facility.  They had a fire drill today, and he was outside trying to move objects out of the way, when he tripped and fell and landed on his left knee.  Patient has a difficult time remembering what happened, this is likely his baseline.  At first patient said he could not really tell me why he was here, and when he was reminded that he fell he was able to provide some of the details.  Patient reports that prior to falling he had no palpitations, no chest pain, no dizziness.  He did feel short of breath but attributes that to exerting himself.  He did not hit his head.  He thinks he did pass out, since he cannot remember the event very well.  The way he describes it though, he was aware of everything going around him the whole time.  Patient reports that he initially did not feel the knee pain that was sharp.  Rest improved it, attempting to weight-bear made it worse.  Left knee scans did not show any acute abnormalities.  As he was being discharged he mentioned left hip pain.  Imaging of left hip pain shows left old fracture.    PT Comments    Patient demonstrates slow labored movement for sitting up at bedside with poor carryover for using BUE to assist with bed mobility, once seated unable to maintain sitting balance with frequent falling over to the right and unable to attempt sit to stands or transfers due to weakness and c/o severe pain left hip with movement.  Patient required Max assist to reposition when put back to bed.  Patient will benefit from continued physical therapy in hospital and  recommended venue below to increase strength, balance, endurance for safe ADLs and gait.     Recommendations for follow up therapy are one component of a multi-disciplinary discharge planning process, led by the attending physician.  Recommendations may be updated based on patient status, additional functional criteria and insurance authorization.  Follow Up Recommendations  Skilled nursing-short term rehab (<3 hours/day)     Assistance Recommended at Discharge Intermittent Supervision/Assistance  Equipment Recommendations  Rolling walker (2 wheels)    Recommendations for Other Services       Precautions / Restrictions Precautions Precautions: Fall;Posterior Hip Precaution Comments: left hemiarthroplasty Restrictions Weight Bearing Restrictions: Yes LLE Weight Bearing: Weight bearing as tolerated     Mobility  Bed Mobility Overal bed mobility: Needs Assistance Bed Mobility: Supine to Sit;Sit to Supine;Rolling Rolling: Max assist   Supine to sit: Max assist Sit to supine: Max assist   General bed mobility comments: slow labored movement with poor carryover for using BUE to assist with bed mobility    Transfers                        Ambulation/Gait                   Stairs             Wheelchair Mobility    Modified Rankin (Stroke Patients Only)  Balance Overall balance assessment: Needs assistance Sitting-balance support: Feet supported;Bilateral upper extremity supported Sitting balance-Leahy Scale: Poor Sitting balance - Comments: seated at EOB Postural control: Posterior lean;Right lateral lean                                  Cognition Arousal/Alertness: Lethargic Behavior During Therapy: WFL for tasks assessed/performed Overall Cognitive Status: History of cognitive impairments - at baseline                                          Exercises      General Comments        Pertinent  Vitals/Pain Pain Assessment: Faces Faces Pain Scale: Hurts even more Pain Location: LLE with movement Pain Descriptors / Indicators: Grimacing;Guarding;Sore Pain Intervention(s): Limited activity within patient's tolerance;Monitored during session;Repositioned    Home Living                          Prior Function            PT Goals (current goals can now be found in the care plan section) Acute Rehab PT Goals Patient Stated Goal: return home PT Goal Formulation: With patient Time For Goal Achievement: 02/11/21 Potential to Achieve Goals: Fair Progress towards PT goals: Progressing toward goals    Frequency    Min 3X/week      PT Plan Current plan remains appropriate    Co-evaluation              AM-PAC PT "6 Clicks" Mobility   Outcome Measure  Help needed turning from your back to your side while in a flat bed without using bedrails?: A Lot Help needed moving from lying on your back to sitting on the side of a flat bed without using bedrails?: A Lot Help needed moving to and from a bed to a chair (including a wheelchair)?: Total Help needed standing up from a chair using your arms (e.g., wheelchair or bedside chair)?: Total Help needed to walk in hospital room?: Total Help needed climbing 3-5 steps with a railing? : Total 6 Click Score: 8    End of Session   Activity Tolerance: Patient tolerated treatment well;Patient limited by pain Patient left: in bed;with call bell/phone within reach Nurse Communication: Mobility status PT Visit Diagnosis: Other abnormalities of gait and mobility (R26.89);Muscle weakness (generalized) (M62.81);Difficulty in walking, not elsewhere classified (R26.2)     Time: [id number] PT Time Calculation (min) (ACUTE ONLY): 20 min  Charges:  $Therapeutic Activity: 8-22 mins                     4:13 PM, 02/06/21 Lonell Grandchild, MPT Physical Therapist with [facility] [phone] office [phone] mobile  phone

## 2021-02-06 NOTE — Progress Notes (Signed)
Please be advised that Kary Kos will require short-term nursing home stay -- anticipated 30 days or less rehabilitation and strengthening. The plan is for return home.

## 2021-02-06 NOTE — Progress Notes (Signed)
Tried to give patient Combivent treatment but patient did not give good effort in sucking in medication.  Attempted to talk louder to patient in instruction of administration and patient would answer but did not give good effort.  RN notified.

## 2021-02-06 NOTE — Progress Notes (Signed)
PROGRESS NOTE    Douglas Cook  [id number] DOB: 05-Jun-1945 DOA: 02/01/2021 PCP: Glenda Chroman, MD    Chief Complaint  Patient presents with   Fall    Brief Narrative:  As per H&P written by Dr. Clearence Ped on 02/02/21 Douglas Cook  is a 75 y.o. male, with history of anemia of chronic disease, chronic kidney disease stage III, COPD, diabetes mellitus, hyperlipidemia, reflux, and more presents the ED with a chief complaint of knee pain.  Patient is coming from a skilled nursing facility.  They had a fire drill today, and he was outside trying to move objects out of the way, when he tripped and fell and landed on his left knee.  Patient has a difficult time remembering what happened, this is likely his baseline.  At first patient said he could not really tell me why he was here, and when he was reminded that he fell he was able to provide some of the details.  Patient reports that prior to falling he had no palpitations, no chest pain, no dizziness.  He did feel short of breath but attributes that to exerting himself.  He did not hit his head.  He thinks he did pass out, since he cannot remember the event very well.  The way he describes it though, he was aware of everything going around him the whole time.  Patient reports that he initially did not feel the knee pain that was sharp.  Rest improved it, attempting to weight-bear made it worse.  Left knee scans did not show any acute abnormalities.  As he was being discharged he mentioned left hip pain.  Imaging of left hip pain shows left closed fracture.   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 -Following recommendations by orthopedic service patient will be using aspirin twice a day for DVT prophylaxis strategy and weightbearing as tolerated. -Physical therapy has seen patient and is recommending skilled nursing facility at discharge. -TOC has been made  aware to assist with placement. -Continue supportive care.  2-COPD (chronic obstructive pulmonary disease) (HCC) -Stable and currently no wheezing or demonstrating exacerbation. -Continue as needed bronchodilators.  3-Type 2 diabetes mellitus (HCC) -Continue sliding scale and reduced dose of basal insulin. -Follow CBGs and adjust hypoglycemic regimen as required.  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 -Continue to follow renal function trend.  5-gastroesophageal reflux disease -Continue PPI  6-hypertension/soft blood pressure -Probably in the setting of analgesics usage -Holding antihypertensive agents at this time and closely watching vital signs.  7-hyperlipidemia -Continue statins  8-history of BPH -No complaining of urinary retention -Continue the use of Flomax.  9-fever/UTI -Patient has almost achieved 24 hours without fever -Continue to follow urine culture -Empirically receiving Rocephin (day 2 out of 5). -Normal WBCs.   DVT prophylaxis: SCDs and ASA BID now. Code Status: (Full/Partial - specify details) Family Communication: No family at bedside. Disposition:   Status is: Inpatient  Remains inpatient appropriate because: In need of skilled nursing facility for rehabilitation and conditioning; patient has been seen by physical therapy.  Orthopedic service recommended weightbearing as tolerated.  Patient spiking fevers and getting work-up to rule out infections.   Consultants:  Orthopedic service  Procedures:  S/p eft hemiarthroplasty later today (02/03/2021).  Antimicrobials:  None   Subjective:  Patient has remained almost 24 hours without spiking fever; no chest pain, no shortness of breath, no nausea or vomiting.  Empirically receiving Rocephin for presumptive  UTI.  Reports pain is well controlled.  Objective: Vitals:   02/06/21 0024 02/06/21 0408 02/06/21 0730 02/06/21 1612  BP: (!) 113/49 (!) 118/59  (!) 112/58  Pulse: 95 95  92  Resp: 20 19  20   Temp: 98.3 F (36.8 C) 98.2 F (36.8 C)  99.2 F (37.3 C)  TempSrc:    Oral  SpO2: 98% 96% 97% 98%  Weight:      Height:        Intake/Output Summary (Last 24 hours) at 02/06/2021 1658 Last data filed at 02/06/2021 0947 Gross per 24 hour  Intake 477 ml  Output 600 ml  Net -123 ml   Filed Weights   02/01/21 2244 02/03/21 1042  Weight: 85.2 kg 85 kg    Examination: General exam: Alert, awake, and following commands appropriately; currently afebrile and in no acute distress.  Reports pain is well controlled.  No shortness of breath, no chest pain.   Respiratory system: Clear to auscultation. Respiratory effort normal.  No requiring oxygen supplementation. Cardiovascular system:RRR. No murmurs, rubs, gallops.  No JVD. Gastrointestinal system: Abdomen is nondistended, soft and nontender. No organomegaly or masses felt. Normal bowel sounds heard. Central nervous system: Alert and oriented. No focal neurological deficits. Extremities: No cyanosis or clubbing. Skin: No rashes, no petechiae; clean dressings in his left lateral hip. Psychiatry: Mood & affect appropriate.    Data Reviewed: I have personally reviewed following labs and imaging studies  CBC: Recent Labs  Lab 02/01/21 2001 02/02/21 0215 02/04/21 0543 02/05/21 0402 02/06/21 0427  WBC 11.3* 10.5 10.4 9.8 9.5  NEUTROABS 7.5 7.9*  --   --   --   HGB 11.4* 11.9* 11.2* 10.1* 9.9*  HCT 34.3* 36.7* 33.5* 31.2* 30.1*  MCV 93.5 93.1 93.1 92.9 91.5  PLT 211 213 168 161 [id number]    Basic Metabolic Panel: Recent Labs  Lab 02/01/21 2001 02/02/21 0215 02/04/21 0543 02/05/21 0402  NA 139 140 134* 132*  K 4.7 4.8 4.4 4.4  CL 104 103 99 99  CO2 26 27 26 25   GLUCOSE 147* 183* 227* 264*  BUN 35* 30* 20 26*  CREATININE 1.64*  1.47* 1.36* 1.56*  CALCIUM 9.4 9.4 8.5* 8.2*    GFR: Estimated Creatinine Clearance: 46.3 mL/min (A) (by C-G formula based on SCr of 1.56 mg/dL (H)).  Liver Function Tests: Recent Labs  Lab 02/02/21 0215  AST 16  ALT 19  ALKPHOS 38  BILITOT 0.6  PROT 6.7  ALBUMIN 3.9    CBG: Recent Labs  Lab 02/05/21 1628 02/05/21 2143 02/06/21 0743 02/06/21 1139 02/06/21 1613  GLUCAP 167* 184* 196* 244* 182*     Recent Results (from the past 240 hour(s))  Resp Panel by RT-PCR (Flu A&B, Covid) Nasopharyngeal Swab     Status: None   Collection Time: 02/01/21  8:14 PM   Specimen: Nasopharyngeal Swab; Nasopharyngeal(NP) swabs in vial transport medium  Result Value Ref Range Status   SARS Coronavirus 2 by RT PCR NEGATIVE NEGATIVE Final    Comment: (NOTE) SARS-CoV-2 target nucleic acids are NOT DETECTED.  The SARS-CoV-2 RNA is generally detectable in upper respiratory specimens during the acute phase of infection. The lowest concentration of SARS-CoV-2 viral copies this assay can detect is 138 copies/mL. A negative result does not preclude SARS-Cov-2 infection and should not be used as  the sole basis for treatment or other patient management decisions. A negative result may occur with  improper specimen collection/handling, submission of specimen other than nasopharyngeal swab, presence of viral mutation(s) within the areas targeted by this assay, and inadequate number of viral copies(<138 copies/mL). A negative result must be combined with clinical observations, patient history, and epidemiological information. The expected result is Negative.  Fact Sheet for Patients:  BloggerCourse.com  Fact Sheet for Healthcare Providers:  SeriousBroker.it  This test is no t yet approved or cleared by the [place] FDA and  has been authorized for detection and/or diagnosis of SARS-CoV-2 by FDA under an Emergency Use Authorization (EUA).  This EUA will remain  in effect (meaning this test can be used) for the duration of the COVID-19 declaration under Section 564(b)(1) of the Act, 21 U.S.C.section 360bbb-3(b)(1), unless the authorization is terminated  or revoked sooner.       Influenza A by PCR NEGATIVE NEGATIVE Final   Influenza B by PCR NEGATIVE NEGATIVE Final    Comment: (NOTE) The Xpert Xpress SARS-CoV-2/FLU/RSV plus assay is intended as an aid in the diagnosis of influenza from Nasopharyngeal swab specimens and should not be used as a sole basis for treatment. Nasal washings and aspirates are unacceptable for Xpert Xpress SARS-CoV-2/FLU/RSV testing.  Fact Sheet for Patients: BloggerCourse.com  Fact Sheet for Healthcare Providers: SeriousBroker.it  This test is not yet approved or cleared by the [place] FDA and has been authorized for detection and/or diagnosis of SARS-CoV-2 by FDA under an Emergency Use Authorization (EUA). This EUA will remain in effect (meaning this test can be used) for the duration of the COVID-19 declaration under Section 564(b)(1) of the Act, 21 U.S.C. section 360bbb-3(b)(1), unless the authorization is terminated or revoked.  Performed at [facility], [address] [place], [place] [address]   Surgical pcr screen     Status: Abnormal   Collection Time: 02/02/21  7:21 AM   Specimen: Nasal Mucosa; Nasal Swab  Result Value Ref Range Status   MRSA, PCR NEGATIVE NEGATIVE Final    Comment: RCRV BERNARD BRANDI @1008  02/02/2021 BY ADGER J.   Staphylococcus aureus POSITIVE (A) NEGATIVE Final    Comment: RCRV BERNARD BRANDI @1008  02/02/2021 BY ADGER J (NOTE) The Xpert SA Assay (FDA approved for NASAL specimens in patients 55 years of age and older), is one component of a comprehensive surveillance program. It is not intended to diagnose infection nor to guide or monitor treatment. Performed at [facility], [address]  [place], [address] [place]   Culture, blood (routine x 2)     Status: None (Preliminary result)   Collection Time: 02/04/21  2:27 PM   Specimen: Left Antecubital; Blood  Result Value Ref Range Status   Specimen Description   Final    LEFT ANTECUBITAL BOTTLES DRAWN AEROBIC AND ANAEROBIC   Special Requests Blood Culture adequate volume  Final   Culture   Final    NO GROWTH 2 DAYS Performed at [facility], [address], [place], [address] [place]    Report Status PENDING  Incomplete  Culture, blood (routine x 2)     Status: None (Preliminary result)   Collection Time: 02/04/21  2:32 PM   Specimen: BLOOD LEFT HAND  Result Value Ref Range Status   Specimen Description   Final    BLOOD LEFT HAND BOTTLES DRAWN AEROBIC AND ANAEROBIC   Special Requests Blood Culture adequate volume  Final   Culture   Final  NO GROWTH 2 DAYS Performed at [facility], [address], [place], [place] [address]    Report Status PENDING  Incomplete  Urine Culture     Status: None   Collection Time: 02/04/21  3:27 PM   Specimen: Urine, Clean Catch  Result Value Ref Range Status   Specimen Description   Final    URINE, CLEAN CATCH Performed at [facility], [address], [place], [place] [address]    Special Requests   Final    Normal Performed at [facility], [address] [place], [place] [address]    Culture   Final    NO GROWTH Performed at [facility], [place] [address] [place], [place] [address]    Report Status 02/06/2021 FINAL  Final     Radiology Studies: No results found.  Scheduled Meds:  aspirin EC  81 mg Oral BID   atorvastatin  10 mg Oral Daily   citalopram  20 mg Oral QHS   docusate sodium  100 mg Oral BID   insulin aspart  0-20 Units Subcutaneous TID WC   insulin aspart  0-5 Units Subcutaneous QHS   insulin detemir  50 Units Subcutaneous QHS   Ipratropium-Albuterol  2 puff Inhalation BID   lamoTRIgine  100 mg Oral Daily   mupirocin ointment  1 application Nasal BID    pantoprazole  40 mg Oral Daily   risperiDONE  0.5 mg Oral QHS   tamsulosin  0.4 mg Oral Daily   traZODone  100 mg Oral QHS   Continuous Infusions:  cefTRIAXone (ROCEPHIN)  IV 1 g (02/06/21 1235)   methocarbamol (ROBAXIN) IV       LOS: 5 days    Time spent: 30 minutes   Barton Dubois, MD [facility]   To contact the attending provider between 7A-7P or the covering provider during after hours 7P-7A, please log into the web site [email or web address] and access using universal [place] password for that web site. If you do not have the password, please call the hospital operator.  02/06/2021, 4:58 PM

## 2021-02-07 ENCOUNTER — Inpatient Hospital Stay
Admission: RE | Admit: 2021-02-07 | Discharge: 2021-03-23 | Disposition: A | Discharge status: Skilled Nursing Facility | Payer: Medicare Other | Source: Ambulatory Visit | Attending: Internal Medicine | Admitting: Internal Medicine

## 2021-02-07 DIAGNOSIS — E119 Type 2 diabetes mellitus without complications: Secondary | ICD-10-CM | POA: Diagnosis not present

## 2021-02-07 DIAGNOSIS — N400 Enlarged prostate without lower urinary tract symptoms: Secondary | ICD-10-CM | POA: Diagnosis not present

## 2021-02-07 DIAGNOSIS — Z794 Long term (current) use of insulin: Secondary | ICD-10-CM | POA: Diagnosis not present

## 2021-02-07 DIAGNOSIS — J449 Chronic obstructive pulmonary disease, unspecified: Secondary | ICD-10-CM | POA: Diagnosis not present

## 2021-02-07 DIAGNOSIS — Z741 Need for assistance with personal care: Secondary | ICD-10-CM | POA: Diagnosis not present

## 2021-02-07 DIAGNOSIS — F339 Major depressive disorder, recurrent, unspecified: Secondary | ICD-10-CM | POA: Diagnosis not present

## 2021-02-07 DIAGNOSIS — E1143 Type 2 diabetes mellitus with diabetic autonomic (poly)neuropathy: Secondary | ICD-10-CM | POA: Diagnosis not present

## 2021-02-07 DIAGNOSIS — F32A Depression, unspecified: Secondary | ICD-10-CM | POA: Diagnosis not present

## 2021-02-07 DIAGNOSIS — K219 Gastro-esophageal reflux disease without esophagitis: Secondary | ICD-10-CM | POA: Diagnosis not present

## 2021-02-07 DIAGNOSIS — N1832 Chronic kidney disease, stage 3b: Secondary | ICD-10-CM | POA: Diagnosis not present

## 2021-02-07 DIAGNOSIS — Z23 Encounter for immunization: Secondary | ICD-10-CM | POA: Diagnosis not present

## 2021-02-07 DIAGNOSIS — R531 Weakness: Secondary | ICD-10-CM | POA: Diagnosis not present

## 2021-02-07 DIAGNOSIS — S72002A Fracture of unspecified part of neck of left femur, initial encounter for closed fracture: Secondary | ICD-10-CM | POA: Diagnosis not present

## 2021-02-07 DIAGNOSIS — E1129 Type 2 diabetes mellitus with other diabetic kidney complication: Secondary | ICD-10-CM | POA: Diagnosis not present

## 2021-02-07 DIAGNOSIS — R509 Fever, unspecified: Secondary | ICD-10-CM

## 2021-02-07 DIAGNOSIS — M6281 Muscle weakness (generalized): Secondary | ICD-10-CM | POA: Diagnosis not present

## 2021-02-07 DIAGNOSIS — E785 Hyperlipidemia, unspecified: Secondary | ICD-10-CM | POA: Diagnosis not present

## 2021-02-07 DIAGNOSIS — Y92129 Unspecified place in nursing home as the place of occurrence of the external cause: Secondary | ICD-10-CM | POA: Diagnosis not present

## 2021-02-07 DIAGNOSIS — R41841 Cognitive communication deficit: Secondary | ICD-10-CM | POA: Diagnosis not present

## 2021-02-07 DIAGNOSIS — D631 Anemia in chronic kidney disease: Secondary | ICD-10-CM | POA: Diagnosis not present

## 2021-02-07 DIAGNOSIS — N183 Chronic kidney disease, stage 3 unspecified: Secondary | ICD-10-CM | POA: Diagnosis not present

## 2021-02-07 DIAGNOSIS — Z96642 Presence of left artificial hip joint: Secondary | ICD-10-CM | POA: Diagnosis not present

## 2021-02-07 DIAGNOSIS — K5909 Other constipation: Secondary | ICD-10-CM | POA: Diagnosis not present

## 2021-02-07 DIAGNOSIS — W010XXA Fall on same level from slipping, tripping and stumbling without subsequent striking against object, initial encounter: Secondary | ICD-10-CM | POA: Diagnosis not present

## 2021-02-07 DIAGNOSIS — N390 Urinary tract infection, site not specified: Secondary | ICD-10-CM | POA: Diagnosis not present

## 2021-02-07 DIAGNOSIS — M159 Polyosteoarthritis, unspecified: Secondary | ICD-10-CM | POA: Diagnosis not present

## 2021-02-07 DIAGNOSIS — R29818 Other symptoms and signs involving the nervous system: Secondary | ICD-10-CM | POA: Diagnosis not present

## 2021-02-07 DIAGNOSIS — Z9181 History of falling: Secondary | ICD-10-CM | POA: Diagnosis not present

## 2021-02-07 DIAGNOSIS — S8002XA Contusion of left knee, initial encounter: Secondary | ICD-10-CM | POA: Diagnosis not present

## 2021-02-07 DIAGNOSIS — S72002S Fracture of unspecified part of neck of left femur, sequela: Secondary | ICD-10-CM | POA: Diagnosis not present

## 2021-02-07 DIAGNOSIS — I739 Peripheral vascular disease, unspecified: Secondary | ICD-10-CM | POA: Diagnosis not present

## 2021-02-07 DIAGNOSIS — S72002D Fracture of unspecified part of neck of left femur, subsequent encounter for closed fracture with routine healing: Secondary | ICD-10-CM | POA: Diagnosis not present

## 2021-02-07 DIAGNOSIS — L602 Onychogryphosis: Secondary | ICD-10-CM | POA: Diagnosis not present

## 2021-02-07 DIAGNOSIS — R4189 Other symptoms and signs involving cognitive functions and awareness: Secondary | ICD-10-CM | POA: Diagnosis not present

## 2021-02-07 DIAGNOSIS — R262 Difficulty in walking, not elsewhere classified: Secondary | ICD-10-CM | POA: Diagnosis not present

## 2021-02-07 DIAGNOSIS — E1122 Type 2 diabetes mellitus with diabetic chronic kidney disease: Secondary | ICD-10-CM | POA: Diagnosis not present

## 2021-02-07 DIAGNOSIS — E1169 Type 2 diabetes mellitus with other specified complication: Secondary | ICD-10-CM | POA: Diagnosis not present

## 2021-02-07 DIAGNOSIS — R279 Unspecified lack of coordination: Secondary | ICD-10-CM | POA: Diagnosis not present

## 2021-02-07 DIAGNOSIS — I129 Hypertensive chronic kidney disease with stage 1 through stage 4 chronic kidney disease, or unspecified chronic kidney disease: Secondary | ICD-10-CM | POA: Diagnosis not present

## 2021-02-07 DIAGNOSIS — R2681 Unsteadiness on feet: Secondary | ICD-10-CM | POA: Diagnosis not present

## 2021-02-07 LAB — GLUCOSE, CAPILLARY
Glucose-Capillary: 139 mg/dL — ABNORMAL HIGH (ref 70–99)
Glucose-Capillary: 269 mg/dL — ABNORMAL HIGH (ref 70–99)

## 2021-02-07 MED ORDER — SENNOSIDES-DOCUSATE SODIUM 8.6-50 MG PO TABS: 1.0000 | ORAL_TABLET | Freq: Every evening | ORAL | Status: DC | PRN

## 2021-02-07 MED ORDER — CEFDINIR 300 MG PO CAPS: 300.0000 mg | ORAL_CAPSULE | Freq: Two times a day (BID) | ORAL | Status: AC

## 2021-02-07 MED ORDER — METFORMIN HCL ER 500 MG PO TB24: 1000.0000 mg | ORAL_TABLET | Freq: Every day | ORAL | Status: DC

## 2021-02-07 MED ORDER — ACETAMINOPHEN 325 MG PO TABS
650.0000 mg | ORAL_TABLET | Freq: Four times a day (QID) | ORAL | Status: DC | PRN
Start: 2021-02-07 — End: 2021-03-22

## 2021-02-07 MED ORDER — ASPIRIN 81 MG PO TBEC: 81.0000 mg | DELAYED_RELEASE_TABLET | Freq: Two times a day (BID) | ORAL | 11 refills | Status: DC

## 2021-02-07 MED ORDER — TRAMADOL HCL 50 MG PO TABS
50.0000 mg | ORAL_TABLET | Freq: Three times a day (TID) | ORAL | 0 refills | Status: AC | PRN
Start: 2021-02-07 — End: 2021-02-14

## 2021-02-07 MED ORDER — NOVOLOG FLEXPEN 100 UNIT/ML ~~LOC~~ SOPN
8.0000 [IU] | PEN_INJECTOR | Freq: Three times a day (TID) | SUBCUTANEOUS | Status: DC
Start: 2021-02-07 — End: 2021-03-22

## 2021-02-07 MED ORDER — LEVEMIR FLEXTOUCH 100 UNIT/ML ~~LOC~~ SOPN: 30.0000 [IU] | PEN_INJECTOR | Freq: Every day | SUBCUTANEOUS | Status: DC

## 2021-02-07 NOTE — Clinical Social Work Note (Signed)
Patient has PASRR [id number] E 02/06/21-03/08/21.    Douglas Cook, Douglas China, LCSW

## 2021-02-07 NOTE — Progress Notes (Signed)
Nsg Discharge Note  Admit Date:  02/01/2021 Discharge date: 02/07/2021   Kary Kos to be D/C'd  [facility] per MD order.  AVS completed.  Copy for chart, and copy for patient signed, and dated. Patient/caregiver able to verbalize understanding.  Discharge Medication: Allergies as of 02/07/2021   No Known Allergies      Medication List     STOP taking these medications    Insulin Glargine 300 UNIT/ML Sopn   insulin lispro 100 UNIT/ML injection Commonly known as: HUMALOG   Onglyza 5 MG Tabs tablet Generic drug: saxagliptin HCl       TAKE these medications    acetaminophen 325 MG tablet Commonly known as: TYLENOL Take 2 tablets (650 mg total) by mouth every 6 (six) hours as needed for mild pain (or Fever >/= 101).   Alpha-Lipoic Acid 100 MG Caps Take 3 capsules by mouth every evening.   aspirin 81 MG EC tablet Take 1 tablet (81 mg total) by mouth 2 (two) times daily. Plan is to treat twice for 30 days; then resume daily use of baby aspirin. What changed:  when to take this additional instructions   atorvastatin 20 MG tablet Commonly known as: LIPITOR Take 20 mg by mouth daily.   cefdinir 300 MG capsule Commonly known as: OMNICEF Take 1 capsule (300 mg total) by mouth 2 (two) times daily for 5 days.   cetirizine 10 MG tablet Commonly known as: ZYRTEC Take 10 mg by mouth at bedtime.   cholecalciferol 25 MCG (1000 UNIT) tablet Commonly known as: VITAMIN D Take 1,000 Units by mouth daily.   citalopram 20 MG tablet Commonly known as: CELEXA Take 20 mg by mouth at bedtime.   Combivent Respimat 20-100 MCG/ACT Aers respimat Generic drug: Ipratropium-Albuterol Inhale 2 puffs into the lungs 2 (two) times daily.   FeroSul 325 (65 FE) MG tablet Generic drug: ferrous sulfate Take 1 tablet by mouth 2 (two) times daily.   fluticasone 50 MCG/ACT nasal spray Commonly known as: FLONASE Place 1 spray into both nostrils daily.    lamoTRIgine 100 MG tablet Commonly known as: LAMICTAL Take 1 tablet by mouth daily.   Levemir FlexTouch 100 UNIT/ML FlexPen Generic drug: insulin detemir Inject 30 Units into the skin at bedtime. What changed: how much to take   lisinopril 2.5 MG tablet Commonly known as: ZESTRIL Take 2.5 mg by mouth daily.   metFORMIN 500 MG 24 hr tablet Commonly known as: GLUCOPHAGE-XR Take 2 tablets (1,000 mg total) by mouth daily with breakfast. What changed:  when to take this Another medication with the same name was removed. Continue taking this medication, and follow the directions you see here.   NovoLOG FlexPen 100 UNIT/ML FlexPen Generic drug: insulin aspart Inject 8 Units into the skin 3 (three) times daily. Hold if blood sugar is less than 60 What changed: how much to take   omeprazole 20 MG capsule Commonly known as: PRILOSEC Take 1 capsule by mouth daily.   risperiDONE 0.5 MG tablet Commonly known as: RISPERDAL Take 0.5 mg by mouth at bedtime.   senna-docusate 8.6-50 MG tablet Commonly known as: Senokot-S Take 1 tablet by mouth at bedtime as needed for mild constipation.   SYSTANE BALANCE OP Apply 1 drop to eye 3 (three) times daily. 1 gtt in each eye BID   tamsulosin 0.4 MG Caps capsule Commonly known as: FLOMAX Take 1 capsule by mouth daily.   Tradjenta 5 MG Tabs tablet Generic drug: linagliptin Take  5 mg by mouth daily.   traMADol 50 MG tablet Commonly known as: ULTRAM Take 1 tablet (50 mg total) by mouth every 8 (eight) hours as needed for up to 7 days for severe pain.   traZODone 100 MG tablet Commonly known as: DESYREL Take 1 tablet by mouth at bedtime.               Discharge Care Instructions  (From admission, onward)           Start     Ordered   02/07/21 0000  Discharge wound care:       Comments: Reinforce dressing as required and keep them clean and dry. Staples removal on 02/17/2021; please contact Dr. Romeo Apple office with any  issues (orthopedic service).   02/07/21 1330            Discharge Assessment: Vitals:   02/07/21 0803 02/07/21 1337  BP:  117/64  Pulse:  98  Resp:  19  Temp:  98.7 F (37.1 C)  SpO2: 94% 97%   Skin clean, dry and intact without evidence of skin break down, no evidence of skin tears noted. IV catheter discontinued intact. Site without signs and symptoms of complications - no redness or edema noted at insertion site, patient denies c/o pain - only slight tenderness at site.  Dressing with slight pressure applied.  D/c Instructions-Education: Discharge instructions given to patient/family with verbalized understanding. D/c education completed with patient/family including follow up instructions, medication list, d/c activities limitations if indicated, with other d/c instructions as indicated by MD - patient able to verbalize understanding, all questions fully answered. Patient instructed to return to ED, call 911, or call MD for any changes in condition.  Patient escorted via WC, and D/C home via private auto.  Brandy Hale, LPN 75/64/3329 5:18 PM

## 2021-02-07 NOTE — TOC Transition Note (Signed)
Transition of Care [facility]) - CM/SW Discharge Note   Patient Details  Name: ARYON NHAM MRN: [id number] Date of Birth: November 16, 1945  Transition of Care [facility]) CM/SW Contact:  Annice Needy, LCSW Phone Number: 02/07/2021, 2:12 PM   Clinical Narrative:    Discharge clinicals sent to facility. Message left for sister regarding discharge.    Final next level of care: Skilled Nursing Facility Barriers to Discharge: No Barriers Identified   Patient Goals and CMS Choice Patient states their goals for this hospitalization and ongoing recovery are:: Rehab with SNF CMS Medicare.gov Compare Post Acute Care list provided to:: Patient Choice offered to / list presented to : Patient  Discharge Placement              Patient chooses bed at: [facility] Patient to be transferred to facility by: staff Name of family member notified: message left for sister Patient and family notified of of transfer: 02/07/21  Discharge Plan and Services                                     Social Determinants of Health (SDOH) Interventions     Readmission Risk Interventions No flowsheet data found.

## 2021-02-07 NOTE — Discharge Summary (Signed)
Physician Discharge Summary  GLENMORE LUIZ [id number] DOB: Apr 23, 1945 DOA: 02/01/2021  PCP: Glenda Chroman, MD  Admit date: 02/01/2021 Discharge date: 02/07/2021  Time spent: 35 minutes  Recommendations for Outpatient Follow-up:  Weightbearing as tolerated Remove staples on 02/17/21 Follow-up with orthopedic service in 4 weeks Repeat basic metabolic panel in 1 week to follow electrolytes and renal function Reassess blood pressure and adjust antihypertensive regimen as needed. Aspirin twice a day for 30 days; then resume daily baby aspirin as previously prescribed.  Discharge Diagnoses:  Active Problems:   COPD (chronic obstructive pulmonary disease) (HCC)   Type 2 diabetes mellitus (HCC)   GERD (gastroesophageal reflux disease)   Hyperlipidemia   Hip fracture (HCC)   Discharge Condition: Stable and improved.  Discharge to skilled nursing facility for further care and rehabilitation.  Patient will follow-up in 4 weeks with orthopedic service.  CODE STATUS: Full code.  Diet recommendation: Heart healthy and modified carbohydrates diet.  Filed Weights   02/01/21 2244 02/03/21 1042  Weight: 85.2 kg 85 kg    History of present illness:  As per H&P written by Dr. Clearence Ped on 02/02/21 Douglas Cook  is a 75 y.o. male, with history of anemia of chronic disease, chronic kidney disease stage III, COPD, diabetes mellitus, hyperlipidemia, reflux, and more presents the ED with a chief complaint of knee pain.  Patient is coming from a skilled nursing facility.  They had a fire drill today, and he was outside trying to move objects out of the way, when he tripped and fell and landed on his left knee.  Patient has a difficult time remembering what happened, this is likely his baseline.  At first patient said he could not really tell me why he was here, and when he was reminded that he fell he was able to provide some of the details.  Patient reports that prior to falling he had no  palpitations, no chest pain, no dizziness.  He did feel short of breath but attributes that to exerting himself.  He did not hit his head.  He thinks he did pass out, since he cannot remember the event very well.  The way he describes it though, he was aware of everything going around him the whole time.  Patient reports that he initially did not feel the knee pain that was sharp.  Rest improved it, attempting to weight-bear made it worse.  Left knee scans did not show any acute abnormalities.  As he was being discharged he mentioned left hip pain.  Imaging of left hip pain shows left closed fracture.  Hospital Course:  1-left hip fracture -Continue as needed analgesic regimen -Patient s/p hemiarthroplasty of his left hip. -Following recommendations by orthopedic service patient will be using aspirin twice a day for DVT prophylaxis strategy and continue weightbearing as tolerated. -Physical therapy has seen patient and has recommended discharge to skilled nursing facility for further care and conditioning.  Patient will go to the [facility] to pursued rehabilitation. -Continue supportive care and maintain adequate hydration.   2-COPD (chronic obstructive pulmonary disease) (HCC) -Stable and currently no wheezing or demonstrating exacerbation. -Continue as needed bronchodilators.   3-Type 2 diabetes mellitus ([place]) -Continue sliding scale, Lantus, metformin and Tradjenta. -Modify carbohydrate diet recommended -Maintain adequate hydration.   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 -Repeat basic metabolic panel in  1 week in order to follow renal function trend and instability.   5-gastroesophageal reflux disease -Continue PPI   6-hypertension/ -Continue heart healthy diet -Continue low-dose  lisinopril. -Blood pressure stable at discharge.   7-hyperlipidemia -Continue statins   8-history of BPH -No complaining of urinary retention -Continue the use of Flomax.   9-fever/UTI -Patient has achieved 48 hours without fever -Continue to maintain adequate hydration. -Continue the use of Tylenol as needed (as antipyretic agent) -Transition to cefdinir orally to complete antibiotic therapy.  Procedures: See below for x-ray reports.  Consultations: Orthopedic service  Discharge Exam: Vitals:   02/07/21 0551 02/07/21 0803  BP: (!) 112/56   Pulse: 85   Resp: 16   Temp: 99.2 F (37.3 C)   SpO2: 94% 94%   General exam: Alert, awake, and following commands appropriately; patient has remained afebrile for 48 hours prior to discharge; he is in not distress.  Reports pain is well controlled.  No shortness of breath, no chest pain.   Respiratory system: Clear to auscultation. Respiratory effort normal.  No requiring oxygen supplementation. Cardiovascular system:RRR. No murmurs, rubs, gallops.  No JVD. Gastrointestinal system: Abdomen is nondistended, soft and nontender. No organomegaly or masses felt. Normal bowel sounds heard. Central nervous system: Alert and oriented. No focal neurological deficits. Extremities: No cyanosis or clubbing. Skin: No rashes, no petechiae; clean dressings in his left lateral hip. Psychiatry: Mood & affect appropriate.     Discharge Instructions   Discharge Instructions     Diet - low sodium heart healthy   Complete by: As directed    Discharge instructions   Complete by: As directed    Follow-up with orthopedic service in 4 weeks Weightbearing as tolerated Aspirin twice a day for 3 days; then resume baby aspirin on daily basis. 5 complete more days of oral antibiotics as instructed and maintain adequate hydration Physical therapy and rehabilitation as per the skilled nursing facility protocol. Remove staples from surgical wound in 10  days. (02/17/21) Heart healthy modified carbohydrate diet recommended.   Discharge wound care:   Complete by: As directed    Reinforce dressing as required and keep them clean and dry. Staples removal on 02/17/2021; please contact Dr. Aline Brochure office with any issues (orthopedic service).   Increase activity slowly   Complete by: As directed       Allergies as of 02/07/2021   No Known Allergies      Medication List     STOP taking these medications    Insulin Glargine 300 UNIT/ML Sopn   insulin lispro 100 UNIT/ML injection Commonly known as: HUMALOG   Onglyza 5 MG Tabs tablet Generic drug: saxagliptin HCl       TAKE these medications    acetaminophen 325 MG tablet Commonly known as: TYLENOL Take 2 tablets (650 mg total) by mouth every 6 (six) hours as needed for mild pain (or Fever >/= 101).   Alpha-Lipoic Acid 100 MG Caps Take 3 capsules by mouth every evening.   aspirin 81 MG EC tablet Take 1 tablet (81 mg total) by mouth 2 (two) times daily. Plan is to treat twice for 30 days; then resume daily use of baby aspirin. What changed:  when to take this additional instructions   atorvastatin 20 MG tablet Commonly known as: LIPITOR Take 20 mg by mouth daily.   cefdinir 300 MG capsule Commonly known as: OMNICEF Take 1 capsule (300 mg total) by mouth 2 (two) times daily for 5 days.   cetirizine 10  MG tablet Commonly known as: ZYRTEC Take 10 mg by mouth at bedtime.   cholecalciferol 25 MCG (1000 UNIT) tablet Commonly known as: VITAMIN D Take 1,000 Units by mouth daily.   citalopram 20 MG tablet Commonly known as: CELEXA Take 20 mg by mouth at bedtime.   Combivent Respimat 20-100 MCG/ACT Aers respimat Generic drug: Ipratropium-Albuterol Inhale 2 puffs into the lungs 2 (two) times daily.   FeroSul 325 (65 FE) MG tablet Generic drug: ferrous sulfate Take 1 tablet by mouth 2 (two) times daily.   fluticasone 50 MCG/ACT nasal spray Commonly known as:  FLONASE Place 1 spray into both nostrils daily.   lamoTRIgine 100 MG tablet Commonly known as: LAMICTAL Take 1 tablet by mouth daily.   Levemir FlexTouch 100 UNIT/ML FlexPen Generic drug: insulin detemir Inject 30 Units into the skin at bedtime. What changed: how much to take   lisinopril 2.5 MG tablet Commonly known as: ZESTRIL Take 2.5 mg by mouth daily.   metFORMIN 500 MG 24 hr tablet Commonly known as: GLUCOPHAGE-XR Take 2 tablets (1,000 mg total) by mouth daily with breakfast. What changed:  when to take this Another medication with the same name was removed. Continue taking this medication, and follow the directions you see here.   NovoLOG FlexPen 100 UNIT/ML FlexPen Generic drug: insulin aspart Inject 8 Units into the skin 3 (three) times daily. Hold if blood sugar is less than 60 What changed: how much to take   omeprazole 20 MG capsule Commonly known as: PRILOSEC Take 1 capsule by mouth daily.   risperiDONE 0.5 MG tablet Commonly known as: RISPERDAL Take 0.5 mg by mouth at bedtime.   senna-docusate 8.6-50 MG tablet Commonly known as: Senokot-S Take 1 tablet by mouth at bedtime as needed for mild constipation.   SYSTANE BALANCE OP Apply 1 drop to eye 3 (three) times daily. 1 gtt in each eye BID   tamsulosin 0.4 MG Caps capsule Commonly known as: FLOMAX Take 1 capsule by mouth daily.   Tradjenta 5 MG Tabs tablet Generic drug: linagliptin Take 5 mg by mouth daily.   traMADol 50 MG tablet Commonly known as: ULTRAM Take 1 tablet (50 mg total) by mouth every 8 (eight) hours as needed for up to 7 days for severe pain.   traZODone 100 MG tablet Commonly known as: DESYREL Take 1 tablet by mouth at bedtime.               Discharge Care Instructions  (From admission, onward)           Start     Ordered   02/07/21 0000  Discharge wound care:       Comments: Reinforce dressing as required and keep them clean and dry. Staples removal on  02/17/2021; please contact Dr. Aline Brochure office with any issues (orthopedic service).   02/07/21 1330           No Known Allergies  Contact information for follow-up providers     Glenda Chroman, MD.   Specialty: Internal Medicine Contact information: [place] [address] [phone]         Carole Civil, MD.   Specialties: Orthopedic Surgery, Radiology Contact information: [address] [place] [address] [phone]         Phillips Odor, MD.   Specialty: Neurology Contact information: [place] [address] [phone]              Contact information for after-discharge care  [facility] .   Service: Skilled Nursing Contact information: [address] [place] [phone]                     The results of significant diagnostics from this hospitalization (including imaging, microbiology, ancillary and laboratory) are listed below for reference.    Significant Diagnostic Studies: CT Head Wo Contrast  Result Date: 02/01/2021 CLINICAL DATA:  Head trauma, minor (Age >= 65y).  Status post fall EXAM: CT HEAD WITHOUT CONTRAST TECHNIQUE: Contiguous axial images were obtained from the base of the skull through the vertex without intravenous contrast. COMPARISON:  CT head 07/04/2017, MR head 07/05/2017 BRAIN: BRAIN Prominence of the lateral ventricles may be related to central predominant atrophy, although a component of normal pressure/communicating hydrocephalus cannot be excluded. patchy and confluent areas of decreased attenuation are noted throughout the deep and periventricular white matter of the cerebral hemispheres bilaterally, compatible with chronic microvascular ischemic disease. No evidence of large-territorial acute infarction. No parenchymal hemorrhage. No mass lesion. No extra-axial collection. No mass effect or midline shift.  No hydrocephalus. Basilar cisterns are patent. Vascular: No hyperdense vessel. Skull: No acute fracture or focal lesion. Sinuses/Orbits: Paranasal sinuses and mastoid air cells are clear. The orbits are unremarkable. Other: None. IMPRESSION: 1. No acute intracranial abnormality. 2. Prominence of the lateral ventricles may be related to central predominant atrophy, although a component of normal pressure/communicating hydrocephalus cannot be excluded. Electronically Signed   By: Iven Finn M.D.   On: 02/01/2021 17:41   DG Pelvis Portable  Result Date: 02/03/2021 CLINICAL DATA:  Left hip replacement. EXAM: PORTABLE PELVIS 1-2 VIEWS COMPARISON:  Left hip x-rays dated February 01, 2021. FINDINGS: The left hip demonstrates a bipolar hemiarthroplasty without evidence of hardware failure or complication. There is no fracture or dislocation. The alignment is anatomic. Post-surgical changes noted in the surrounding soft tissues. IMPRESSION: 1. Left hip bipolar hemiarthroplasty without acute postoperative complication. Electronically Signed   By: Titus Dubin M.D.   On: 02/03/2021 14:43   DG CHEST PORT 1 VIEW  Result Date: 02/04/2021 CLINICAL DATA:  Fever EXAM: PORTABLE CHEST 1 VIEW COMPARISON:  07/04/2017 FINDINGS: The cardiomediastinal silhouette is unchanged in contour. No pleural effusion. No pneumothorax. No acute pleuroparenchymal abnormality. Visualized abdomen is unremarkable. IMPRESSION: No acute cardiopulmonary abnormality. Electronically Signed   By: Valentino Saxon M.D.   On: 02/04/2021 14:51   DG Knee Complete 4 Views Left  Result Date: 02/01/2021 CLINICAL DATA:  Fall.  Left knee pain. EXAM: LEFT KNEE - COMPLETE 4+ VIEW COMPARISON:  None. FINDINGS: The mineralization and alignment are normal. There is no evidence of acute fracture or dislocation. The joint spaces are relatively preserved, although there is chondrocalcinosis of both menisci and small joint effusion. There is mild spurring  at the quadriceps insertion on the patella. IMPRESSION: No acute osseous findings or significant arthropathic changes. Meniscal chondrocalcinosis and small joint effusion. Electronically Signed   By: Richardean Sale M.D.   On: 02/01/2021 18:04   DG HIP UNILAT WITH PELVIS 2-3 VIEWS LEFT  Result Date: 02/01/2021 CLINICAL DATA:  Pain after fall today EXAM: DG HIP (WITH OR WITHOUT PELVIS) 2-3V LEFT COMPARISON:  04/08/2013 FINDINGS: Acute left femoral neck fracture with mild displacement. Femoral head projects in joint. IMPRESSION: Acute mildly displaced left femoral neck fracture Electronically Signed   By: Donavan Foil M.D.   On: 02/01/2021 19:36    Microbiology: Recent Results (  from the past 240 hour(s))  Resp Panel by RT-PCR (Flu A&B, Covid) Nasopharyngeal Swab     Status: None   Collection Time: 02/01/21  8:14 PM   Specimen: Nasopharyngeal Swab; Nasopharyngeal(NP) swabs in vial transport medium  Result Value Ref Range Status   SARS Coronavirus 2 by RT PCR NEGATIVE NEGATIVE Final    Comment: (NOTE) SARS-CoV-2 target nucleic acids are NOT DETECTED.  The SARS-CoV-2 RNA is generally detectable in upper respiratory specimens during the acute phase of infection. The lowest concentration of SARS-CoV-2 viral copies this assay can detect is 138 copies/mL. A negative result does not preclude SARS-Cov-2 infection and should not be used as the sole basis for treatment or other patient management decisions. A negative result may occur with  improper specimen collection/handling, submission of specimen other than nasopharyngeal swab, presence of viral mutation(s) within the areas targeted by this assay, and inadequate number of viral copies(<138 copies/mL). A negative result must be combined with clinical observations, patient history, and epidemiological information. The expected result is Negative.  Fact Sheet for Patients:  BloggerCourse.com  Fact Sheet for Healthcare  Providers:  SeriousBroker.it  This test is no t yet approved or cleared by the [place] FDA and  has been authorized for detection and/or diagnosis of SARS-CoV-2 by FDA under an Emergency Use Authorization (EUA). This EUA will remain  in effect (meaning this test can be used) for the duration of the COVID-19 declaration under Section 564(b)(1) of the Act, 21 U.S.C.section 360bbb-3(b)(1), unless the authorization is terminated  or revoked sooner.       Influenza A by PCR NEGATIVE NEGATIVE Final   Influenza B by PCR NEGATIVE NEGATIVE Final    Comment: (NOTE) The Xpert Xpress SARS-CoV-2/FLU/RSV plus assay is intended as an aid in the diagnosis of influenza from Nasopharyngeal swab specimens and should not be used as a sole basis for treatment. Nasal washings and aspirates are unacceptable for Xpert Xpress SARS-CoV-2/FLU/RSV testing.  Fact Sheet for Patients: BloggerCourse.com  Fact Sheet for Healthcare Providers: SeriousBroker.it  This test is not yet approved or cleared by the [place] FDA and has been authorized for detection and/or diagnosis of SARS-CoV-2 by FDA under an Emergency Use Authorization (EUA). This EUA will remain in effect (meaning this test can be used) for the duration of the COVID-19 declaration under Section 564(b)(1) of the Act, 21 U.S.C. section 360bbb-3(b)(1), unless the authorization is terminated or revoked.  Performed at [facility], [address], [place], [place] [address]   Surgical pcr screen     Status: Abnormal   Collection Time: 02/02/21  7:21 AM   Specimen: Nasal Mucosa; Nasal Swab  Result Value Ref Range Status   MRSA, PCR NEGATIVE NEGATIVE Final    Comment: RCRV BERNARD BRANDI @1008  02/02/2021 BY ADGER J.   Staphylococcus aureus POSITIVE (A) NEGATIVE Final    Comment: RCRV BERNARD BRANDI @1008  02/02/2021 BY ADGER J (NOTE) The Xpert SA Assay (FDA approved  for NASAL specimens in patients 11 years of age and older), is one component of a comprehensive surveillance program. It is not intended to diagnose infection nor to guide or monitor treatment. Performed at [facility], [address] [place], [address] [place]   Culture, blood (routine x 2)     Status: None (Preliminary result)   Collection Time: 02/04/21  2:27 PM   Specimen: Left Antecubital; Blood  Result Value Ref Range Status   Specimen Description   Final    LEFT ANTECUBITAL BOTTLES DRAWN AEROBIC  AND ANAEROBIC   Special Requests Blood Culture adequate volume  Final   Culture   Final    NO GROWTH 3 DAYS Performed at [facility], [address] [place], [place] [address]    Report Status PENDING  Incomplete  Culture, blood (routine x 2)     Status: None (Preliminary result)   Collection Time: 02/04/21  2:32 PM   Specimen: BLOOD LEFT HAND  Result Value Ref Range Status   Specimen Description   Final    BLOOD LEFT HAND BOTTLES DRAWN AEROBIC AND ANAEROBIC   Special Requests Blood Culture adequate volume  Final   Culture   Final    NO GROWTH 3 DAYS Performed at [facility], [address] [place], [place] [address]    Report Status PENDING  Incomplete  Urine Culture     Status: None   Collection Time: 02/04/21  3:27 PM   Specimen: Urine, Clean Catch  Result Value Ref Range Status   Specimen Description   Final    URINE, CLEAN CATCH Performed at [facility], [address] [place], [place] [address]    Special Requests   Final    Normal Performed at [facility], [address] [place], [place] [address]    Culture   Final    NO GROWTH Performed at [facility], [place] [address] [place], [place] [address]    Report Status 02/06/2021 FINAL  Final     Labs: Basic Metabolic Panel: Recent Labs  Lab 02/01/21 2001 02/02/21 0215 02/04/21 0543 02/05/21 0402  NA 139 140 134* 132*  K 4.7 4.8 4.4 4.4  CL 104 103 99 99  CO2 26 27 26 25   GLUCOSE 147* 183* 227*  264*  BUN 35* 30* 20 26*  CREATININE 1.64* 1.47* 1.36* 1.56*  CALCIUM 9.4 9.4 8.5* 8.2*   Liver Function Tests: Recent Labs  Lab 02/02/21 0215  AST 16  ALT 19  ALKPHOS 38  BILITOT 0.6  PROT 6.7  ALBUMIN 3.9   CBC: Recent Labs  Lab 02/01/21 2001 02/02/21 0215 02/04/21 0543 02/05/21 0402 02/06/21 0427  WBC 11.3* 10.5 10.4 9.8 9.5  NEUTROABS 7.5 7.9*  --   --   --   HGB 11.4* 11.9* 11.2* 10.1* 9.9*  HCT 34.3* 36.7* 33.5* 31.2* 30.1*  MCV 93.5 93.1 93.1 92.9 91.5  PLT 211 213 168 161 177   CBG: Recent Labs  Lab 02/06/21 0743 02/06/21 1139 02/06/21 1613 02/07/21 0740 02/07/21 1132  GLUCAP 196* 244* 182* 139* 269*   Signed:  Barton Dubois MD.  [facility] 02/07/2021, 1:30 PM

## 2021-02-08 ENCOUNTER — Non-Acute Institutional Stay (SKILLED_NURSING_FACILITY): Payer: Medicare Other | Admitting: Adult Health

## 2021-02-08 ENCOUNTER — Encounter (HOSPITAL_COMMUNITY): Payer: Self-pay | Admitting: Orthopedic Surgery

## 2021-02-08 DIAGNOSIS — E1122 Type 2 diabetes mellitus with diabetic chronic kidney disease: Secondary | ICD-10-CM

## 2021-02-08 DIAGNOSIS — E1169 Type 2 diabetes mellitus with other specified complication: Secondary | ICD-10-CM | POA: Diagnosis not present

## 2021-02-08 DIAGNOSIS — I129 Hypertensive chronic kidney disease with stage 1 through stage 4 chronic kidney disease, or unspecified chronic kidney disease: Secondary | ICD-10-CM

## 2021-02-08 DIAGNOSIS — E785 Hyperlipidemia, unspecified: Secondary | ICD-10-CM | POA: Diagnosis not present

## 2021-02-08 DIAGNOSIS — N390 Urinary tract infection, site not specified: Secondary | ICD-10-CM | POA: Insufficient documentation

## 2021-02-08 DIAGNOSIS — S72002S Fracture of unspecified part of neck of left femur, sequela: Secondary | ICD-10-CM | POA: Diagnosis not present

## 2021-02-08 DIAGNOSIS — N400 Enlarged prostate without lower urinary tract symptoms: Secondary | ICD-10-CM

## 2021-02-08 DIAGNOSIS — N1832 Chronic kidney disease, stage 3b: Secondary | ICD-10-CM | POA: Diagnosis not present

## 2021-02-08 DIAGNOSIS — D631 Anemia in chronic kidney disease: Secondary | ICD-10-CM

## 2021-02-08 DIAGNOSIS — F339 Major depressive disorder, recurrent, unspecified: Secondary | ICD-10-CM | POA: Diagnosis not present

## 2021-02-08 DIAGNOSIS — K219 Gastro-esophageal reflux disease without esophagitis: Secondary | ICD-10-CM | POA: Diagnosis not present

## 2021-02-08 DIAGNOSIS — Z794 Long term (current) use of insulin: Secondary | ICD-10-CM

## 2021-02-08 DIAGNOSIS — K5909 Other constipation: Secondary | ICD-10-CM

## 2021-02-08 DIAGNOSIS — I10 Essential (primary) hypertension: Secondary | ICD-10-CM | POA: Insufficient documentation

## 2021-02-08 DIAGNOSIS — J449 Chronic obstructive pulmonary disease, unspecified: Secondary | ICD-10-CM

## 2021-02-08 DIAGNOSIS — S72002A Fracture of unspecified part of neck of left femur, initial encounter for closed fracture: Secondary | ICD-10-CM

## 2021-02-08 DIAGNOSIS — N183 Chronic kidney disease, stage 3 unspecified: Secondary | ICD-10-CM | POA: Insufficient documentation

## 2021-02-08 NOTE — Progress Notes (Signed)
Location:  [facility] Room Number: 136 Place of Service:  SNF (31) Provider: Ok Edwards, NP   CODE STATUS: FULL CODE  No Known Allergies  Chief Complaint  Patient presents with   Hospitalization Follow-up    HPI:  He is a 75 year old man who has been hospitalized from 02-01-21 through 02-07-21. His medical history includes: diabetes ckd stage 3; copd; hypertension. 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 He landed on his left knee. He was brought to the ED. He was found to have a left femoral neck fracture. He underwent left hip hemiarthroplasty on 02-03-21. He will need to complete abt for a UTI without positive culture. He is here for short term rehab. At this time this is a short term rehab placement. He does not complain of any pain. He states that he is tired. He denies any insomnia. 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:  Type 2 diabetes mellitus with stage 3b chronic kidney disease with current long term use of insulin:   Hyperlipidemia associated with type 2 diabetes mellitus:      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)    Arthritis    CKD (chronic kidney disease), stage III (HCC)    COPD (chronic obstructive pulmonary disease) ([place])    Depression    Diabetes mellitus without complication ([place])    Fatigue    Hyperlipidemia    Reflux     Past Surgical History:  Procedure Laterality Date   HIP ARTHROPLASTY Left 02/03/2021   Procedure: ARTHROPLASTY BIPOLAR HIP (HEMIARTHROPLASTY);  Surgeon: Carole Civil, MD;  Location: [facility];  Service: Orthopedics;  Laterality: Left;    Social History   Socioeconomic History   Marital status: Single    Spouse name: Not  on file   Number of children: Not on file   Years of education: Not on file   Highest education level: Not on file  Occupational History   Not on file  Tobacco Use   Smoking status: Every Day    Types: Pipe   Smokeless tobacco: Never  Substance and Sexual Activity   Alcohol use: No   Drug use: No   Sexual activity: Not on file  Other Topics Concern   Not on file  Social History Narrative   Not on file   Social Determinants of Health   Financial Resource Strain: Not on file  Food Insecurity: Not on file  Transportation Needs: Not on file  Physical Activity: Not on file  Stress: Not on file  Social Connections: Not on file  Intimate Partner Violence: Not on file   Family History  Problem Relation Age of Onset   Stroke Mother    Diabetes Father       VITAL SIGNS BP 122/74   Pulse 88   Temp (!) 97 F (36.1 C)   Resp 20   Ht 6' (1.829 m)   SpO2 98%   BMI 25.41 kg/m   Outpatient Encounter Medications as of 02/08/2021  Medication Sig   acetaminophen (TYLENOL) 325 MG tablet Take 2 tablets (650 mg total) by mouth every 6 (six) hours as needed for mild pain (or Fever >/= 101).   Alpha-Lipoic Acid 100 MG CAPS  Take 3 capsules by mouth every evening.   aspirin EC 81 MG EC tablet Take 1 tablet (81 mg total) by mouth 2 (two) times daily. Plan is to treat twice for 30 days; then resume daily use of baby aspirin.   atorvastatin (LIPITOR) 20 MG tablet Take 20 mg by mouth daily.   cefdinir (OMNICEF) 300 MG capsule Take 1 capsule (300 mg total) by mouth 2 (two) times daily for 5 days.   cetirizine (ZYRTEC) 10 MG tablet Take 10 mg by mouth at bedtime.   cholecalciferol (VITAMIN D) 1000 UNITS tablet Take 1,000 Units by mouth daily.   citalopram (CELEXA) 20 MG tablet Take 20 mg by mouth at bedtime.   COMBIVENT RESPIMAT 20-100 MCG/ACT AERS respimat Inhale 2 puffs into the lungs 2 (two) times daily.   FEROSUL 325 (65 Fe) MG tablet Take 1 tablet by mouth 2 (two) times daily.    fluticasone (FLONASE) 50 MCG/ACT nasal spray Place 1 spray into both nostrils daily.   lamoTRIgine (LAMICTAL) 100 MG tablet Take 1 tablet by mouth daily.   LEVEMIR FLEXTOUCH 100 UNIT/ML FlexTouch Pen Inject 30 Units into the skin at bedtime.   lisinopril (PRINIVIL,ZESTRIL) 2.5 MG tablet Take 2.5 mg by mouth daily.   metFORMIN (GLUCOPHAGE-XR) 500 MG 24 hr tablet Take 2 tablets (1,000 mg total) by mouth daily with breakfast.   NOVOLOG FLEXPEN 100 UNIT/ML FlexPen Inject 8 Units into the skin 3 (three) times daily. Hold if blood sugar is less than 60   omeprazole (PRILOSEC) 20 MG capsule Take 1 capsule by mouth daily.   Propylene Glycol (SYSTANE BALANCE OP) Apply 1 drop to eye 3 (three) times daily. 1 gtt in each eye BID   risperiDONE (RISPERDAL) 0.5 MG tablet Take 0.5 mg by mouth at bedtime.   senna-docusate (SENOKOT-S) 8.6-50 MG tablet Take 1 tablet by mouth at bedtime as needed for mild constipation.   tamsulosin (FLOMAX) 0.4 MG CAPS capsule Take 1 capsule by mouth daily.   TRADJENTA 5 MG TABS tablet Take 5 mg by mouth daily.   traMADol (ULTRAM) 50 MG tablet Take 1 tablet (50 mg total) by mouth every 8 (eight) hours as needed for up to 7 days for severe pain.   traZODone (DESYREL) 100 MG tablet Take 1 tablet by mouth at bedtime.   No facility-administered encounter medications on file as of 02/08/2021.     SIGNIFICANT DIAGNOSTIC EXAMS  TODAY  02-01-21: pelvic x-ray: Acute mildly displaced left femoral neck fracture  02-01-21: left knee x-ray: No acute osseous findings or significant arthropathic changes. Meniscal chondrocalcinosis and small joint effusion.  02-01-21: left knee x-ray:  1. No acute intracranial abnormality. 2. Prominence of the lateral ventricles may be related to central predominant atrophy, although a component of normal pressure/communicating hydrocephalus cannot be excluded.   LABS REVIEWED TODAY;   02-01-21: wbc 11.3; hgb 11.4; hct 34.3; mcv 93.5 plt 211; glucose  147; bun 35; creat 1.64; k+ 4.7; na++ 139; ca 9.4 GFR 44 02-02-21: wbc 10.5; hgb 11.9; hct 36.7; mcv 93.1 plt 213; glucose 183; bun 30; creat 1.47 ;k+ 4.8; na++ 140; ca 9.4 GFR 50 liver normal albumin 3.9 hgb a1c 6.5 02-04-21: blood and urine cultures: no growth 02-06-21: wbc 9.5; hgb 9.9; hct 30.1; mcv 91.5 pt 177   Review of Systems  Constitutional:  Negative for malaise/fatigue.  Respiratory:  Negative for cough and shortness of breath.   Cardiovascular:  Negative for chest pain, palpitations and leg swelling.  Gastrointestinal:  Negative for  abdominal pain, constipation and heartburn.  Musculoskeletal:  Negative for back pain, joint pain and myalgias.  Skin: Negative.   Neurological:  Negative for dizziness.  Psychiatric/Behavioral:  The patient is not nervous/anxious.      Physical Exam Constitutional:      General: He is not in acute distress.    Appearance: He is well-developed. He is not diaphoretic.  Neck:     Thyroid: No thyromegaly.  Cardiovascular:     Rate and Rhythm: Normal rate and regular rhythm.     Pulses: Normal pulses.     Heart sounds: Normal heart sounds.  Pulmonary:     Effort: Pulmonary effort is normal. No respiratory distress.     Breath sounds: Normal breath sounds.  Abdominal:     General: Bowel sounds are normal. There is no distension.     Palpations: Abdomen is soft.     Tenderness: There is no abdominal tenderness.  Musculoskeletal:        General: Normal range of motion.     Cervical back: Neck supple.     Right lower leg: No edema.     Left lower leg: No edema.     Comments: Is able to move all extremities:  Is status post bipolar left hip 02-03-21  Lymphadenopathy:     Cervical: No cervical adenopathy.  Skin:    General: Skin is warm and dry.     Comments: Incision line without signs of infection present.   Neurological:     Mental Status: He is alert and oriented to person, place, and time.  Psychiatric:        Mood and Affect: Mood  normal.       ASSESSMENT/ PLAN:  TODAY  Closed fracture of left femoral neck sequela: is stable will continue therapy as directed and will follow up with ortho as indicated. He will continue asa 81 mg twice daily for 30 days then return to daily. He has ultram 50 mg every 6 hours as needed for pain; will monitor his status.   2. Chronic obstructive pulmonary disease (COPD) unspecified type: is stable will continue combivent respmit 20/100 2 puffs twice daily; zyrtec 10 mg daily   3. 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 Type 2 diabetes mellitus with stage 3b chronic kidney disease with current long term use of insulin: is stable hgb a1c 6.5: will continue metformin 1 gm daily; tradjenta 5 mg daily levemir 30 units nightly novolog 8 units with meals.   5. Hyperlipidemia associated with type 2 diabetes mellitus: is stable will continue lipitor 20 mg daily   6. Gastroesophageal reflux disease without esophagitis: is stable will continue prilosec 20 mg daily   7. BPH without obstruction/lower urinary tract symptoms: is stable will continue flomax 0.4 mg daily   8. Anemia due to stage 3b chronic kidney disease: is stable hgb 9.9 will continue iron twice daily   9. CKD stage 3b due to type 2 diabetes mellitus: is stable bun 30; creat 1.47 GFR 50  10. Chronic constipation: is stable will continue senna s daily as needed  11. Major depression recurrent chronic: is stable will continue celexa 20 mg daily lamictal 100 mg daily to stabilize mood; risperal 0.5 mg daily and trazodone 100 mg nightly for sleep  12. Acute UTI: will complete omnicef 300 mg twice daily for 5 more days. Cultures were negative  for growth.    Will check BMP hgb/hct    Ok Edwards NP [facility]  Contact [phone] Monday through Friday 8am- 5pm  After hours call [phone]

## 2021-02-09 LAB — CULTURE, BLOOD (ROUTINE X 2)
Culture: NO GROWTH
Culture: NO GROWTH
Special Requests: ADEQUATE
Special Requests: ADEQUATE

## 2021-02-10 ENCOUNTER — Non-Acute Institutional Stay (SKILLED_NURSING_FACILITY): Payer: Medicare Other | Admitting: Internal Medicine

## 2021-02-10 ENCOUNTER — Encounter: Payer: Self-pay | Admitting: Internal Medicine

## 2021-02-10 DIAGNOSIS — N183 Chronic kidney disease, stage 3 unspecified: Secondary | ICD-10-CM | POA: Diagnosis not present

## 2021-02-10 DIAGNOSIS — R29818 Other symptoms and signs involving the nervous system: Secondary | ICD-10-CM

## 2021-02-10 DIAGNOSIS — S72002S Fracture of unspecified part of neck of left femur, sequela: Secondary | ICD-10-CM

## 2021-02-10 DIAGNOSIS — E1122 Type 2 diabetes mellitus with diabetic chronic kidney disease: Secondary | ICD-10-CM

## 2021-02-10 DIAGNOSIS — F0390 Unspecified dementia without behavioral disturbance: Secondary | ICD-10-CM | POA: Insufficient documentation

## 2021-02-10 DIAGNOSIS — R4189 Other symptoms and signs involving cognitive functions and awareness: Secondary | ICD-10-CM | POA: Diagnosis not present

## 2021-02-10 NOTE — Assessment & Plan Note (Signed)
WBAT as tolerated with PT/OT at the SNF.

## 2021-02-10 NOTE — Assessment & Plan Note (Signed)
During his hospitalization for the hip fracture initial creatinine was 1.56 and GFR 44 indicating stage IIIb CKD.  Prior to discharge creatinine was 1.64 with CKD of 50 indicating stage IIIa. A1c of 6.5% indicated good control; but blood sugars here at the SNF have ranged from 206 up to 310.  These values and A1c suggest discordance related to CKD.  He remains on NovoLog 8 units before meals if glucose is over 150.

## 2021-02-10 NOTE — Progress Notes (Signed)
NURSING HOME LOCATION: [facility] ROOM NUMBER:  136  CODE STATUS:  Full Code  PCP:  Doreen Beam MD  This is a comprehensive admission note to this SNFperformed on this date less than 30 days from date of admission. Included are preadmission medical/surgical history; reconciled medication list; family history; social history and comprehensive review of systems.  Corrections and additions to the records were documented. Comprehensive physical exam was also performed. Additionally a clinical summary was entered for each active diagnosis pertinent to this admission in the Problem List to enhance continuity of care.  HPI: He was hospitalized 11/9 - 02/07/2021 presenting to the ED with knee pain.  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  He denied any definite cardiac or neurologic prodrome prior to the fall.  Initially the knee pain was not significant but weightbearing exacerbated the pain.  Left knee images did not reveal any acute abnormalities.  As he was being discharged he mentioned left hip pain; imaging of the hip revealed a left closed fracture. Left hip hemiarthroplasty was performed by Dr. Fuller Canada without complication.  Aspirin twice daily was prescribed for DVT prophylaxis.  Weightbearing was to be as tolerated.  Mild anemia was documented with H/H of 11.4/34.3.  Glucoses in the hospital were 147-183.  A1c was 6.5% suggesting excellent control.  This was in the context of CKD.  Initial creatinine was 1.56 and GFR 44 indicating stage IIIb.  Prior to discharge creatinine was 1.64 with CKD of 50 indicating stage IIIa. PT recommended rehab at an SNF.  Past medical and surgical history: Includes history of chronic anemia, COPD, diabetes with CKD, and dyslipidemia.  Social history: Nondrinker; pipe smoker PTA.  Family history: Includes diabetes and stroke.   Review of systems:  Clinical neurocognitive deficits questioned both based on his variable history in the ED as well as here.  He exhibits inconsistency with answers and orientation according to the staff. BIMS  MSE is pending.  Today he stated that it was "Wednesday, 10th day, 10th month, and 11th year".  He identified the POTUS as "Riven".  . He can provide no meaningful history about his hospitalization.  When asked why he had been in the hospital he stated that his "back hurt sometimes and they worked on it".  He made no mention of his hip.  He does validate some shortness of breath.  When I asked where he had been living he stated "in [place]".  He went on to confabulate "I am in a group of people who have different and go to the [facility]".  Physical exam:  Pertinent or positive findings: He sits in the wheelchair at the bedside with his neck flexed.  As we conversed he kept looking down at the table in front of him.  He never  engaged me visibly.  He appears older than his stated age . Wrinkling is noted around the mouth and the mouth is sunken.  He is edentulous.  As noted he keeps his neck flexed.  All responses are markedly slow and delayed.  There is a suggestion of a very subtle head tremor.  Slight gallop cadence is suggested.  He has minor low-grade rales at the bases.  Abdomen is protuberant.  Pedal pulses are decreased.  Feet are cool.  There is trace edema at the sock line.  General appearance: Adequately nourished; no acute distress,  increased work of breathing is present.   Lymphatic: No lymphadenopathy about the head, neck, axilla. Eyes: No conjunctival inflammation or lid edema is present. There is no scleral icterus. Ears:  External ear exam shows no significant lesions or deformities.   Nose:  External nasal examination shows no deformity or inflammation. Nasal mucosa are pink and moist without lesions, exudates Neck:  No thyromegaly, masses, tenderness noted.    Heart:  No murmur, click, rub.   Lungs: without wheezes, rhonchi, rubs. Abdomen: Bowel sounds are normal.  Abdomen is soft and nontender with no organomegaly, hernias, masses. GU: Deferred  Extremities:  No cyanosis, clubbing. Neurologic exam:  Balance, Rhomberg, finger to nose testing could not be completed due to clinical state Skin: Warm & dry w/o tenting. No significant lesions or rash.  See clinical summary under each active problem in the Problem List with associated updated therapeutic plan

## 2021-02-11 NOTE — Patient Instructions (Signed)
See assessment and plan under each diagnosis in the problem list and acutely for this visit 

## 2021-02-11 NOTE — Assessment & Plan Note (Signed)
BIMS MSE pending

## 2021-02-13 ENCOUNTER — Other Ambulatory Visit (HOSPITAL_COMMUNITY)
Admission: RE | Admit: 2021-02-13 | Discharge: 2021-02-13 | Disposition: A | Discharge status: Home / Self Care | Payer: Medicare Other | Source: Skilled Nursing Facility | Attending: Adult Health | Admitting: Adult Health

## 2021-02-13 DIAGNOSIS — E1143 Type 2 diabetes mellitus with diabetic autonomic (poly)neuropathy: Secondary | ICD-10-CM | POA: Insufficient documentation

## 2021-02-13 LAB — BASIC METABOLIC PANEL
Anion gap: 8 (ref 5–15)
BUN: 27 mg/dL — ABNORMAL HIGH (ref 8–23)
CO2: 28 mmol/L (ref 22–32)
Calcium: 9.6 mg/dL (ref 8.9–10.3)
Chloride: 100 mmol/L (ref 98–111)
Creatinine, Ser: 1.35 mg/dL — ABNORMAL HIGH (ref 0.61–1.24)
GFR, Estimated: 55 mL/min — ABNORMAL LOW (ref >60)
Glucose, Bld: 169 mg/dL — ABNORMAL HIGH (ref 70–99)
Potassium: 5.6 mmol/L — ABNORMAL HIGH (ref 3.5–5.1)
Sodium: 136 mmol/L (ref 135–145)

## 2021-02-13 LAB — HEMOGLOBIN AND HEMATOCRIT, BLOOD
HCT: 39.3 % (ref 39.0–52.0)
Hemoglobin: 12.1 g/dL — ABNORMAL LOW (ref 13.0–17.0)

## 2021-02-22 DIAGNOSIS — F32A Depression, unspecified: Secondary | ICD-10-CM | POA: Diagnosis not present

## 2021-02-22 DIAGNOSIS — K219 Gastro-esophageal reflux disease without esophagitis: Secondary | ICD-10-CM | POA: Diagnosis not present

## 2021-02-24 ENCOUNTER — Non-Acute Institutional Stay (SKILLED_NURSING_FACILITY): Payer: Medicare Other | Admitting: Adult Health

## 2021-02-24 ENCOUNTER — Encounter: Payer: Self-pay | Admitting: Adult Health

## 2021-02-24 DIAGNOSIS — N183 Chronic kidney disease, stage 3 unspecified: Secondary | ICD-10-CM | POA: Diagnosis not present

## 2021-02-24 DIAGNOSIS — J449 Chronic obstructive pulmonary disease, unspecified: Secondary | ICD-10-CM

## 2021-02-24 DIAGNOSIS — E1122 Type 2 diabetes mellitus with diabetic chronic kidney disease: Secondary | ICD-10-CM

## 2021-02-24 DIAGNOSIS — S72002S Fracture of unspecified part of neck of left femur, sequela: Secondary | ICD-10-CM

## 2021-02-24 DIAGNOSIS — D631 Anemia in chronic kidney disease: Secondary | ICD-10-CM | POA: Diagnosis not present

## 2021-02-24 DIAGNOSIS — N1832 Chronic kidney disease, stage 3b: Secondary | ICD-10-CM

## 2021-02-24 NOTE — Progress Notes (Signed)
Location:  [place] Room Number: 136-P Place of Service:  SNF (31) Provider: Ok Edwards, NP  CODE STATUS: FULL CODE  No Known Allergies  Chief Complaint  Patient presents with   Acute Visit    Care plan meeting.    HPI:  We have come together for her care plan meeting. Family present. BIMS 6/15 mood 6/30: nervous; trouble concentrating. He is requires extensive assist to dependent with adls. He is  nonambulatory. He is incontinent of bladder and bowel. He has had several falls without injury. Dietary: CONCHO diet; weight is stable at 182.4 pounds has good appetite feeds self. Therapy: transfers: minimal assist; in/out of bed: min assist; ambulate 8 steps min assist. Upper body: min assist; lower body: max assist. BRP mod/max assist; ST cognition. He is poorly motivated poor left side weight bearing. 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)    Arthritis    CKD (chronic kidney disease), stage III (HCC)    COPD (chronic obstructive pulmonary disease) ([place])    Depression    Diabetes mellitus without complication (HCC)    Fatigue    Hyperlipidemia    Reflux     Past Surgical History:  Procedure Laterality Date   HIP ARTHROPLASTY Left 02/03/2021   Procedure: ARTHROPLASTY BIPOLAR HIP (HEMIARTHROPLASTY);  Surgeon: Carole Civil, MD;  Location: [facility];  Service: Orthopedics;  Laterality: Left;    Social History   Socioeconomic History   Marital status: Single    Spouse name: Not on file   Number of children: Not on file   Years of education: Not on file   Highest education level: Not on file  Occupational History   Not on file   Tobacco Use   Smoking status: Every Day    Types: Pipe   Smokeless tobacco: Never  Substance and Sexual Activity   Alcohol use: No   Drug use: No   Sexual activity: Not on file  Other Topics Concern   Not on file  Social History Narrative   Not on file   Social Determinants of Health   Financial Resource Strain: Not on file  Food Insecurity: Not on file  Transportation Needs: Not on file  Physical Activity: Not on file  Stress: Not on file  Social Connections: Not on file  Intimate Partner Violence: Not on file   Family History  Problem Relation Age of Onset   Stroke Mother    Diabetes Father       VITAL SIGNS BP (!) 102/54   Pulse 90   Temp (!) 97.4 F (36.3 C)   Resp 20   Ht 6' (1.829 m)   Wt 183 lb 3.2 oz (83.1 kg)   SpO2 92%   BMI 24.85 kg/m   Outpatient Encounter Medications as of 02/24/2021  Medication Sig   acetaminophen (TYLENOL) 325 MG tablet Take 2 tablets (650 mg total) by mouth every 6 (six) hours as needed for mild pain (or Fever >/= 101).   Alpha-Lipoic Acid 100 MG CAPS Take 3 capsules by mouth every evening.   aspirin EC 81 MG EC tablet Take 1 tablet (81 mg total) by mouth 2 (  two) times daily. Plan is to treat twice for 30 days; then resume daily use of baby aspirin.   atorvastatin (LIPITOR) 20 MG tablet Take 20 mg by mouth daily.   cetirizine (ZYRTEC) 10 MG tablet Take 10 mg by mouth at bedtime.   cholecalciferol (VITAMIN D) 1000 UNITS tablet Take 1,000 Units by mouth daily.   citalopram (CELEXA) 20 MG tablet Take 20 mg by mouth at bedtime.   COMBIVENT RESPIMAT 20-100 MCG/ACT AERS respimat Inhale 2 puffs into the lungs 2 (two) times daily.   FEROSUL 325 (65 Fe) MG tablet Take 1 tablet by mouth 2 (two) times daily.   fluticasone (FLONASE) 50 MCG/ACT nasal spray Place 1 spray into both nostrils daily.   lamoTRIgine (LAMICTAL) 100 MG tablet Take 1 tablet by mouth daily.   LEVEMIR FLEXTOUCH 100 UNIT/ML FlexTouch Pen Inject 30 Units into the skin at  bedtime.   lisinopril (PRINIVIL,ZESTRIL) 2.5 MG tablet Take 2.5 mg by mouth daily.   metFORMIN (GLUCOPHAGE-XR) 500 MG 24 hr tablet Take 2 tablets (1,000 mg total) by mouth daily with breakfast.   NOVOLOG FLEXPEN 100 UNIT/ML FlexPen Inject 8 Units into the skin 3 (three) times daily. Hold if blood sugar is less than 60   omeprazole (PRILOSEC) 20 MG capsule Take 1 capsule by mouth daily.   Propylene Glycol (SYSTANE BALANCE OP) Apply 1 drop to eye 3 (three) times daily. 1 gtt in each eye BID   risperiDONE (RISPERDAL) 0.5 MG tablet Take 0.5 mg by mouth at bedtime.   senna-docusate (SENOKOT-S) 8.6-50 MG tablet Take 1 tablet by mouth at bedtime as needed for mild constipation.   tamsulosin (FLOMAX) 0.4 MG CAPS capsule Take 1 capsule by mouth daily.   TRADJENTA 5 MG TABS tablet Take 5 mg by mouth daily.   traZODone (DESYREL) 100 MG tablet Take 1 tablet by mouth at bedtime.   No facility-administered encounter medications on file as of 02/24/2021.     SIGNIFICANT DIAGNOSTIC EXAMS  PREVIOUS   02-01-21: pelvic x-ray: Acute mildly displaced left femoral neck fracture  02-01-21: left knee x-ray: No acute osseous findings or significant arthropathic changes. Meniscal chondrocalcinosis and small joint effusion.  02-01-21: left knee x-ray:  1. No acute intracranial abnormality. 2. Prominence of the lateral ventricles may be related to central predominant atrophy, although a component of normal pressure/communicating hydrocephalus cannot be excluded.  NO NEW EXAMS    LABS REVIEWED PREVIOUS    02-01-21: wbc 11.3; hgb 11.4; hct 34.3; mcv 93.5 plt 211; glucose 147; bun 35; creat 1.64; k+ 4.7; na++ 139; ca 9.4 GFR 44 02-02-21: wbc 10.5; hgb 11.9; hct 36.7; mcv 93.1 plt 213; glucose 183; bun 30; creat 1.47 ;k+ 4.8; na++ 140; ca 9.4 GFR 50 liver normal albumin 3.9 hgb a1c 6.5 02-04-21: blood and urine cultures: no growth 02-06-21: wbc 9.5; hgb 9.9; hct 30.1; mcv 91.5 pt 177  NO NEW LABS.   Review of  Systems  Constitutional:  Negative for malaise/fatigue.  Respiratory:  Negative for cough and shortness of breath.   Cardiovascular:  Negative for chest pain, palpitations and leg swelling.  Gastrointestinal:  Negative for abdominal pain, constipation and heartburn.  Musculoskeletal:  Negative for back pain, joint pain and myalgias.  Skin: Negative.   Neurological:  Negative for dizziness.  Psychiatric/Behavioral:  The patient is not nervous/anxious.     Physical Exam Constitutional:      General: He is not in acute distress.    Appearance: He is well-developed. He is not diaphoretic.  Neck:     Thyroid: No thyromegaly.  Cardiovascular:     Rate and Rhythm: Normal rate and regular rhythm.     Pulses: Normal pulses.     Heart sounds: Normal heart sounds.  Pulmonary:     Effort: Pulmonary effort is normal. No respiratory distress.     Breath sounds: Normal breath sounds.  Abdominal:     General: Bowel sounds are normal. There is no distension.     Palpations: Abdomen is soft.     Tenderness: There is no abdominal tenderness.  Musculoskeletal:     Cervical back: Neck supple.     Right lower leg: No edema.     Left lower leg: No edema.     Comments:  Is able to move all extremities:  Is status post bipolar left hip 02-03-21   Lymphadenopathy:     Cervical: No cervical adenopathy.  Skin:    General: Skin is warm and dry.  Neurological:     Mental Status: He is alert. Mental status is at baseline.  Psychiatric:        Mood and Affect: Mood normal.     ASSESSMENT/ PLAN:  TODAY  Closed fracture of left neck femur sequela Chronic obstructive pulmonary disease unspecified COPD  CKD stage 3 due type 2 diabetes mellitus Anemia due to stage 3b chronic kidney disease  Will continue therapy as directed Will continue current medications Will continue current plan of care  Time spent with patient 40 minutes: care plan; therapy; medications.    Synthia Innocent NP [facility]  Adult Medicine  Contact [phone] Monday through Friday 8am- 5pm  After hours call [phone]

## 2021-03-06 ENCOUNTER — Encounter: Payer: Self-pay | Admitting: Orthopedic Surgery

## 2021-03-06 ENCOUNTER — Ambulatory Visit (INDEPENDENT_AMBULATORY_CARE_PROVIDER_SITE_OTHER): Payer: Medicare Other | Admitting: Orthopedic Surgery

## 2021-03-06 ENCOUNTER — Other Ambulatory Visit: Payer: Self-pay

## 2021-03-06 DIAGNOSIS — S72002D Fracture of unspecified part of neck of left femur, subsequent encounter for closed fracture with routine healing: Secondary | ICD-10-CM

## 2021-03-06 NOTE — Progress Notes (Signed)
Chief Complaint  Patient presents with   Post-op Follow-up    Left hip/ incision well healed, patient states he is walking with walker    DOS 02/03/21  PRE-OPERATIVE DIAGNOSIS:  left hip fracture   POST-OPERATIVE DIAGNOSIS:  left hip fracture   PROCEDURE:  Procedure(s): ARTHROPLASTY BIPOLAR HIP (HEMIARTHROPLASTY) (Left)  Currently no complaints.  Patient is ambulating with a walker weight-bear as tolerated  Hip range of motion did not cause any pain leg lengths seem relatively equal limb alignment was good  Follow-up in 2 months continue weight-bear as tolerated direct lateral hip precautions

## 2021-03-07 DIAGNOSIS — I739 Peripheral vascular disease, unspecified: Secondary | ICD-10-CM | POA: Diagnosis not present

## 2021-03-07 DIAGNOSIS — L602 Onychogryphosis: Secondary | ICD-10-CM | POA: Diagnosis not present

## 2021-03-08 ENCOUNTER — Encounter: Payer: Self-pay | Admitting: Adult Health

## 2021-03-08 ENCOUNTER — Non-Acute Institutional Stay (SKILLED_NURSING_FACILITY): Payer: Medicare Other | Admitting: Adult Health

## 2021-03-08 DIAGNOSIS — S72002D Fracture of unspecified part of neck of left femur, subsequent encounter for closed fracture with routine healing: Secondary | ICD-10-CM | POA: Diagnosis not present

## 2021-03-08 DIAGNOSIS — E1122 Type 2 diabetes mellitus with diabetic chronic kidney disease: Secondary | ICD-10-CM

## 2021-03-08 DIAGNOSIS — N1832 Chronic kidney disease, stage 3b: Secondary | ICD-10-CM

## 2021-03-08 DIAGNOSIS — I129 Hypertensive chronic kidney disease with stage 1 through stage 4 chronic kidney disease, or unspecified chronic kidney disease: Secondary | ICD-10-CM

## 2021-03-08 DIAGNOSIS — Z794 Long term (current) use of insulin: Secondary | ICD-10-CM

## 2021-03-08 DIAGNOSIS — J449 Chronic obstructive pulmonary disease, unspecified: Secondary | ICD-10-CM

## 2021-03-08 NOTE — Progress Notes (Signed)
Location:  [facility] Room Number: 136-P Place of Service:  SNF (31)   CODE STATUS: Full Code   No Known Allergies  Chief Complaint  Patient presents with   Medical Management of Chronic Issues                  Closed fracture of left femoral neck sequela:     Chronic obstructive pulmonary disease (COPD) unspecified type:    Hypertension associated with chronic kidney disease stage 3b due to type 2 diabetes mellitus:  Type 2 diabetes mellitus with stage 3b chronic kidney disease with current long term use of insulin:     HPI:  He is a resident of this facility being seen for the management of his chronic illnesses: Closed fracture of left femoral neck sequela:     Chronic obstructive pulmonary disease (COPD) unspecified type:    Hypertension associated with chronic kidney disease stage 3b due to type 2 diabetes mellitus:  Type 2 diabetes mellitus with stage 3b chronic kidney disease with current long term use of insulin. There are no reports of uncontrolled pain. Weight is stable appetite is good.   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)    Arthritis    CKD (chronic kidney disease), stage III (HCC)    COPD (chronic obstructive pulmonary disease) (HCC)    Depression    Diabetes mellitus without complication (HCC)    Fatigue    Hyperlipidemia    Reflux     Past Surgical History:  Procedure Laterality Date   HIP ARTHROPLASTY Left 02/03/2021   Procedure: ARTHROPLASTY BIPOLAR HIP (HEMIARTHROPLASTY);  Surgeon: Vickki Hearing, MD;  Location: [facility];  Service: Orthopedics;  Laterality: Left;    Social History   Socioeconomic History   Marital status: Single    Spouse name: Not on file   Number of children: Not on file   Years of education: Not on file   Highest education level: Not on file  Occupational History   Not on file  Tobacco Use   Smoking status: Every Day    Types: Pipe    Smokeless tobacco: Never  Vaping Use   Vaping Use: Never used  Substance and Sexual Activity   Alcohol use: No   Drug use: No   Sexual activity: Not on file  Other Topics Concern   Not on file  Social History Narrative   Not on file   Social Determinants of Health   Financial Resource Strain: Not on file  Food Insecurity: Not on file  Transportation Needs: Not on file  Physical Activity: Not on file  Stress: Not on file  Social Connections: Not on file  Intimate Partner Violence: Not on file   Family History  Problem Relation Age of Onset   Stroke Mother    Diabetes Father       VITAL SIGNS BP (!) 102/54    Pulse 90    Temp 97.7 F (36.5 C)    Resp 20    Ht 6' (1.829 m)    Wt 183 lb (83 kg)    SpO2 92%    BMI 24.82 kg/m   Outpatient Encounter Medications as of 03/08/2021  Medication Sig   acetaminophen (TYLENOL) 325 MG tablet Take 2 tablets (650 mg total) by mouth every 6 (six) hours as needed for mild pain (or Fever >/= 101).   Alpha-Lipoic Acid 300 MG CAPS Take 1  capsule by mouth daily at 12 noon. At 10 pm   atorvastatin (LIPITOR) 20 MG tablet Take 20 mg by mouth daily.   cetirizine (ZYRTEC) 10 MG tablet Take 10 mg by mouth at bedtime.   cholecalciferol (VITAMIN D) 1000 UNITS tablet Take 1,000 Units by mouth daily.   citalopram (CELEXA) 20 MG tablet Take 20 mg by mouth at bedtime.   COMBIVENT RESPIMAT 20-100 MCG/ACT AERS respimat Inhale 2 puffs into the lungs 2 (two) times daily.   feeding supplement, GLUCERNA SHAKE, (GLUCERNA SHAKE) LIQD Take 237 mLs by mouth daily.   FEROSUL 325 (65 Fe) MG tablet Take 1 tablet by mouth 2 (two) times daily.   fluticasone (FLONASE) 50 MCG/ACT nasal spray Place 1 spray into both nostrils daily.   lamoTRIgine (LAMICTAL) 100 MG tablet Take 1 tablet by mouth daily.   LEVEMIR FLEXTOUCH 100 UNIT/ML FlexTouch Pen Inject 30 Units into the skin at bedtime.   lisinopril (PRINIVIL,ZESTRIL) 2.5 MG tablet Take 2.5 mg by mouth daily.   metFORMIN  (GLUCOPHAGE-XR) 500 MG 24 hr tablet Take 2 tablets (1,000 mg total) by mouth daily with breakfast.   NOVOLOG FLEXPEN 100 UNIT/ML FlexPen Inject 8 Units into the skin 3 (three) times daily. Hold if blood sugar is less than 60   omeprazole (PRILOSEC) 20 MG capsule Take 1 capsule by mouth daily.   Propylene Glycol (SYSTANE BALANCE OP) Apply 1 drop to eye 2 (two) times daily. Both eyes   risperiDONE (RISPERDAL) 0.5 MG tablet Take 0.5 mg by mouth at bedtime.   senna-docusate (SENOKOT-S) 8.6-50 MG tablet Take 1 tablet by mouth at bedtime as needed for mild constipation.   tamsulosin (FLOMAX) 0.4 MG CAPS capsule Take 1 capsule by mouth daily.   TRADJENTA 5 MG TABS tablet Take 5 mg by mouth daily.   traZODone (DESYREL) 100 MG tablet Take 1 tablet by mouth at bedtime.   UNABLE TO FIND Diet: Consistent carbohydrate   [DISCONTINUED] Alpha-Lipoic Acid 100 MG CAPS Take 3 capsules by mouth every evening.   [DISCONTINUED] aspirin EC 81 MG EC tablet Take 1 tablet (81 mg total) by mouth 2 (two) times daily. Plan is to treat twice for 30 days; then resume daily use of baby aspirin.   [DISCONTINUED] atorvastatin (LIPITOR) 40 MG tablet Take 40 mg by mouth daily as needed.   No facility-administered encounter medications on file as of 03/08/2021.     SIGNIFICANT DIAGNOSTIC EXAMS   PREVIOUS   02-01-21: pelvic x-ray: Acute mildly displaced left femoral neck fracture  02-01-21: left knee x-ray: No acute osseous findings or significant arthropathic changes. Meniscal chondrocalcinosis and small joint effusion.  02-01-21: left knee x-ray:  1. No acute intracranial abnormality. 2. Prominence of the lateral ventricles may be related to central predominant atrophy, although a component of normal pressure/communicating hydrocephalus cannot be excluded.  NO NEW EXAMS    LABS REVIEWED PREVIOUS    02-01-21: wbc 11.3; hgb 11.4; hct 34.3; mcv 93.5 plt 211; glucose 147; bun 35; creat 1.64; k+ 4.7; na++ 139; ca 9.4 GFR  44 02-02-21: wbc 10.5; hgb 11.9; hct 36.7; mcv 93.1 plt 213; glucose 183; bun 30; creat 1.47 ;k+ 4.8; na++ 140; ca 9.4 GFR 50 liver normal albumin 3.9 hgb a1c 6.5 02-04-21: blood and urine cultures: no growth 02-06-21: wbc 9.5; hgb 9.9; hct 30.1; mcv 91.5 pt 177  TODAY  02-13-21: hgb 12.1; hct 39.3 glucose 169; bun 27; creat 1.35; k+ 5.6; na++ 136; ca 96 GFR 55   Review of Systems  Constitutional:  Negative for malaise/fatigue.  Respiratory:  Negative for cough and shortness of breath.   Cardiovascular:  Negative for chest pain, palpitations and leg swelling.  Gastrointestinal:  Negative for abdominal pain, constipation and heartburn.  Musculoskeletal:  Negative for back pain, joint pain and myalgias.  Skin: Negative.   Neurological:  Negative for dizziness.  Psychiatric/Behavioral:  The patient is not nervous/anxious.    Physical Exam Constitutional:      General: He is not in acute distress.    Appearance: He is well-developed. He is not diaphoretic.  Neck:     Thyroid: No thyromegaly.  Cardiovascular:     Rate and Rhythm: Normal rate and regular rhythm.     Pulses: Normal pulses.     Heart sounds: Normal heart sounds.  Pulmonary:     Effort: Pulmonary effort is normal. No respiratory distress.     Breath sounds: Normal breath sounds.  Abdominal:     General: Bowel sounds are normal. There is no distension.     Palpations: Abdomen is soft.     Tenderness: There is no abdominal tenderness.  Musculoskeletal:     Cervical back: Neck supple.     Right lower leg: No edema.     Left lower leg: No edema.     Comments:  Is able to move all extremities:  Is status post bipolar left hip 02-03-21    Lymphadenopathy:     Cervical: No cervical adenopathy.  Skin:    General: Skin is warm and dry.  Neurological:     Mental Status: He is alert. Mental status is at baseline.  Psychiatric:        Mood and Affect: Mood normal.     ASSESSMENT/ PLAN:  TODAY  Closed fracture of  left femoral neck sequela: is stable will continue to monitor and will follow up with ortho as indicated.   2. Chronic obstructive pulmonary disease (COPD) unspecified type: is stable will continue combivent respmit 20/100 2 puffs twice daily zyrtec 10 mg daily   3. 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 Type 2 diabetes mellitus with stage 3b chronic kidney disease with current long term use of insulin: is stable hgb a1c 6.5 will continue metformin 1 gm daily tradjetna 5 mg daily levemir 30 units nightly novolog 8 units with meals.   PREVIOUS   5. Hyperlipidemia associated with type 2 diabetes mellitus: is stable will continue lipitor 20 mg daily   6. Gastroesophageal reflux disease without esophagitis: is stable will continue prilosec 20 mg daily   7. BPH without obstruction/lower urinary tract symptoms: is stable will continue flomax 0.4 mg daily   8. Anemia due to stage 3b chronic kidney disease: is stable hgb 9.9 will continue iron twice daily   9. CKD stage 3b due to type 2 diabetes mellitus: is stable bun 30; creat 1.47 GFR 50  10. Chronic constipation: is stable will continue senna s daily as needed  11. Major depression recurrent chronic: is stable will continue celexa 20 mg daily lamictal 100 mg daily to stabilize mood; risperal 0.5 mg daily and trazodone 100 mg nightly for sleep    Ok Edwards NP [facility]  Contact [phone] Monday through Friday 8am- 5pm  After hours call [phone]

## 2021-03-22 ENCOUNTER — Encounter: Payer: Self-pay | Admitting: Adult Health

## 2021-03-22 ENCOUNTER — Other Ambulatory Visit: Payer: Self-pay | Admitting: Adult Health

## 2021-03-22 ENCOUNTER — Non-Acute Institutional Stay (SKILLED_NURSING_FACILITY): Payer: Medicare Other | Admitting: Adult Health

## 2021-03-22 DIAGNOSIS — E1122 Type 2 diabetes mellitus with diabetic chronic kidney disease: Secondary | ICD-10-CM | POA: Diagnosis not present

## 2021-03-22 DIAGNOSIS — J449 Chronic obstructive pulmonary disease, unspecified: Secondary | ICD-10-CM

## 2021-03-22 DIAGNOSIS — S72002D Fracture of unspecified part of neck of left femur, subsequent encounter for closed fracture with routine healing: Secondary | ICD-10-CM

## 2021-03-22 DIAGNOSIS — N1832 Chronic kidney disease, stage 3b: Secondary | ICD-10-CM

## 2021-03-22 DIAGNOSIS — I129 Hypertensive chronic kidney disease with stage 1 through stage 4 chronic kidney disease, or unspecified chronic kidney disease: Secondary | ICD-10-CM

## 2021-03-22 DIAGNOSIS — F339 Major depressive disorder, recurrent, unspecified: Secondary | ICD-10-CM | POA: Diagnosis not present

## 2021-03-22 MED ORDER — TAMSULOSIN HCL 0.4 MG PO CAPS: 0.4000 mg | ORAL_CAPSULE | Freq: Every day | ORAL | 0 refills | Status: DC

## 2021-03-22 MED ORDER — VITAMIN D3 25 MCG (1000 UNIT) PO TABS: 1000.0000 [IU] | ORAL_TABLET | Freq: Every day | ORAL | 0 refills | Status: AC

## 2021-03-22 MED ORDER — OMEPRAZOLE 20 MG PO CPDR: 20.0000 mg | DELAYED_RELEASE_CAPSULE | Freq: Every day | ORAL | 0 refills | Status: AC

## 2021-03-22 MED ORDER — SENNOSIDES-DOCUSATE SODIUM 8.6-50 MG PO TABS: 1.0000 | ORAL_TABLET | Freq: Every evening | ORAL | 0 refills | Status: DC | PRN

## 2021-03-22 MED ORDER — LEVEMIR FLEXTOUCH 100 UNIT/ML ~~LOC~~ SOPN: 30.0000 [IU] | PEN_INJECTOR | Freq: Every day | SUBCUTANEOUS | 0 refills | Status: AC

## 2021-03-22 MED ORDER — TRADJENTA 5 MG PO TABS: 5.0000 mg | ORAL_TABLET | Freq: Every day | ORAL | 0 refills | Status: AC

## 2021-03-22 MED ORDER — FEROSUL 325 (65 FE) MG PO TABS: 325.0000 mg | ORAL_TABLET | Freq: Two times a day (BID) | ORAL | 0 refills | Status: AC

## 2021-03-22 MED ORDER — INSULIN LISPRO (1 UNIT DIAL) 100 UNIT/ML (KWIKPEN): 8.0000 [IU] | PEN_INJECTOR | Freq: Three times a day (TID) | SUBCUTANEOUS | 0 refills | Status: DC

## 2021-03-22 MED ORDER — ATORVASTATIN CALCIUM 20 MG PO TABS: 20.0000 mg | ORAL_TABLET | Freq: Every day | ORAL | 0 refills | Status: AC

## 2021-03-22 MED ORDER — RISPERIDONE 0.5 MG PO TABS: 0.5000 mg | ORAL_TABLET | Freq: Every day | ORAL | 0 refills | Status: AC

## 2021-03-22 MED ORDER — FLUTICASONE PROPIONATE 50 MCG/ACT NA SUSP: 1.0000 | Freq: Every day | NASAL | 0 refills | Status: AC

## 2021-03-22 MED ORDER — TRAZODONE HCL 100 MG PO TABS: 100.0000 mg | ORAL_TABLET | Freq: Every day | ORAL | 0 refills | Status: AC

## 2021-03-22 MED ORDER — LISINOPRIL 2.5 MG PO TABS: 2.5000 mg | ORAL_TABLET | Freq: Every day | ORAL | 0 refills | Status: DC

## 2021-03-22 MED ORDER — ALPHA-LIPOIC ACID 300 MG PO CAPS: 1.0000 | ORAL_CAPSULE | Freq: Every day | ORAL | 0 refills | Status: DC

## 2021-03-22 MED ORDER — SYSTANE BALANCE 0.6 % OP SOLN: 1.0000 [drp] | Freq: Two times a day (BID) | OPHTHALMIC | 0 refills | Status: AC

## 2021-03-22 MED ORDER — LAMOTRIGINE 100 MG PO TABS: 100.0000 mg | ORAL_TABLET | Freq: Every day | ORAL | 0 refills | Status: AC

## 2021-03-22 MED ORDER — METFORMIN HCL ER 500 MG PO TB24: 1000.0000 mg | ORAL_TABLET | Freq: Every day | ORAL | 0 refills | Status: AC

## 2021-03-22 MED ORDER — CETIRIZINE HCL 10 MG PO TABS: 10.0000 mg | ORAL_TABLET | Freq: Every day | ORAL | 0 refills | Status: AC

## 2021-03-22 MED ORDER — GLUCERNA SHAKE PO LIQD: 237.0000 mL | Freq: Every day | ORAL | 0 refills | Status: DC

## 2021-03-22 MED ORDER — ASPIRIN EC 81 MG PO TBEC: 81.0000 mg | DELAYED_RELEASE_TABLET | Freq: Every day | ORAL | 0 refills | Status: AC

## 2021-03-22 MED ORDER — CITALOPRAM HYDROBROMIDE 20 MG PO TABS: 20.0000 mg | ORAL_TABLET | Freq: Every day | ORAL | 0 refills | Status: AC

## 2021-03-22 MED ORDER — ACETAMINOPHEN 325 MG PO TABS: 650.0000 mg | ORAL_TABLET | Freq: Four times a day (QID) | ORAL | 0 refills | Status: AC | PRN

## 2021-03-22 MED ORDER — COMBIVENT RESPIMAT 20-100 MCG/ACT IN AERS: 2.0000 | INHALATION_SPRAY | Freq: Two times a day (BID) | RESPIRATORY_TRACT | 0 refills | Status: AC

## 2021-03-22 NOTE — Progress Notes (Signed)
Location:  [facility] Room Number: 136-P Place of Service:  SNF (31)   CODE STATUS: Full Code   No Known Allergies  Chief Complaint  Patient presents with   Discharge Note    Discharge from [facility]     HPI:  He is being discharged to assisted living with home health for pt/ot/rn. He will need a wheelchair. He will need to have his prescriptions written and will need to follow up with his pcp. He had been hospitalized for a left femoral fracture. He had a left hip arthroplasty on 02-03-21. He was admitted to this facility for short term rehab. He has participated in both pt/ot to improve upon his level of independence with his adls. He is now ready to return back to his assisted living with a wheelchair.   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)    Arthritis    CKD (chronic kidney disease), stage III (HCC)    COPD (chronic obstructive pulmonary disease) ([place])    Depression    Diabetes mellitus without complication (HCC)    Fatigue    Hyperlipidemia    Reflux     Past Surgical History:  Procedure Laterality Date   HIP ARTHROPLASTY Left 02/03/2021   Procedure: ARTHROPLASTY BIPOLAR HIP (HEMIARTHROPLASTY);  Surgeon: Carole Civil, MD;  Location: [facility];  Service: Orthopedics;  Laterality: Left;    Social History   Socioeconomic History   Marital status: Single    Spouse name: Not on file   Number of children: Not on file   Years of education: Not on file   Highest education level: Not on file  Occupational History   Not on file  Tobacco Use   Smoking status: Every Day    Types: Pipe   Smokeless tobacco: Never  Vaping Use   Vaping Use: Never used  Substance and Sexual Activity   Alcohol use: No   Drug use: No   Sexual activity: Not on file  Other Topics Concern   Not on file  Social History Narrative   Not on file   Social Determinants of Health   Financial  Resource Strain: Not on file  Food Insecurity: Not on file  Transportation Needs: Not on file  Physical Activity: Not on file  Stress: Not on file  Social Connections: Not on file  Intimate Partner Violence: Not on file   Family History  Problem Relation Age of Onset   Stroke Mother    Diabetes Father       VITAL SIGNS BP (!) 102/54    Pulse 90    Temp 97.7 F (36.5 C)    Resp 20    Ht 6' (1.829 m)    Wt 183 lb 3.2 oz (83.1 kg)    SpO2 92%    BMI 24.85 kg/m   Outpatient Encounter Medications as of 03/22/2021  Medication Sig   acetaminophen (TYLENOL) 325 MG tablet Take 2 tablets (650 mg total) by mouth every 6 (six) hours as needed for mild pain (or Fever >/= 101).   Alpha-Lipoic Acid 300 MG CAPS Take 1 capsule by mouth daily at 12 noon. At 10 pm   aspirin EC 81 MG tablet Take 81 mg by mouth daily. Swallow whole.   atorvastatin (LIPITOR) 20 MG tablet Take 20 mg by mouth daily.   cetirizine (ZYRTEC) 10 MG tablet Take 10 mg by mouth at bedtime.  cholecalciferol (VITAMIN D) 1000 UNITS tablet Take 1,000 Units by mouth daily.   citalopram (CELEXA) 20 MG tablet Take 20 mg by mouth at bedtime.   COMBIVENT RESPIMAT 20-100 MCG/ACT AERS respimat Inhale 2 puffs into the lungs 2 (two) times daily.   feeding supplement, GLUCERNA SHAKE, (GLUCERNA SHAKE) LIQD Take 237 mLs by mouth daily.   FEROSUL 325 (65 Fe) MG tablet Take 1 tablet by mouth 2 (two) times daily.   fluticasone (FLONASE) 50 MCG/ACT nasal spray Place 1 spray into both nostrils daily.   insulin lispro (HUMALOG) 100 UNIT/ML KwikPen Inject 8 Units into the skin 3 (three) times daily with meals.   lamoTRIgine (LAMICTAL) 100 MG tablet Take 1 tablet by mouth daily.   LEVEMIR FLEXTOUCH 100 UNIT/ML FlexTouch Pen Inject 30 Units into the skin at bedtime.   lisinopril (PRINIVIL,ZESTRIL) 2.5 MG tablet Take 2.5 mg by mouth daily.   metFORMIN (GLUCOPHAGE-XR) 500 MG 24 hr tablet Take 2 tablets (1,000 mg total) by mouth daily with breakfast.    omeprazole (PRILOSEC) 20 MG capsule Take 1 capsule by mouth daily.   Propylene Glycol (SYSTANE BALANCE OP) Apply 1 drop to eye 2 (two) times daily. Both eyes   risperiDONE (RISPERDAL) 0.5 MG tablet Take 0.5 mg by mouth at bedtime.   senna-docusate (SENOKOT-S) 8.6-50 MG tablet Take 1 tablet by mouth at bedtime as needed for mild constipation.   tamsulosin (FLOMAX) 0.4 MG CAPS capsule Take 1 capsule by mouth daily.   TRADJENTA 5 MG TABS tablet Take 5 mg by mouth daily.   traZODone (DESYREL) 100 MG tablet Take 1 tablet by mouth at bedtime.   UNABLE TO FIND Diet: Consistent carbohydrate   [DISCONTINUED] NOVOLOG FLEXPEN 100 UNIT/ML FlexPen Inject 8 Units into the skin 3 (three) times daily. Hold if blood sugar is less than 60   No facility-administered encounter medications on file as of 03/22/2021.     SIGNIFICANT DIAGNOSTIC EXAMS   PREVIOUS   02-01-21: pelvic x-ray: Acute mildly displaced left femoral neck fracture  02-01-21: left knee x-ray: No acute osseous findings or significant arthropathic changes. Meniscal chondrocalcinosis and small joint effusion.  02-01-21: left knee x-ray:  1. No acute intracranial abnormality. 2. Prominence of the lateral ventricles may be related to central predominant atrophy, although a component of normal pressure/communicating hydrocephalus cannot be excluded.  NO NEW EXAMS    LABS REVIEWED PREVIOUS    02-01-21: wbc 11.3; hgb 11.4; hct 34.3; mcv 93.5 plt 211; glucose 147; bun 35; creat 1.64; k+ 4.7; na++ 139; ca 9.4 GFR 44 02-02-21: wbc 10.5; hgb 11.9; hct 36.7; mcv 93.1 plt 213; glucose 183; bun 30; creat 1.47 ;k+ 4.8; na++ 140; ca 9.4 GFR 50 liver normal albumin 3.9 hgb a1c 6.5 02-04-21: blood and urine cultures: no growth 02-06-21: wbc 9.5; hgb 9.9; hct 30.1; mcv 91.5 pt 177 02-13-21: hgb 12.1; hct 39.3 glucose 169; bun 27; creat 1.35; k+ 5.6; na++ 136; ca 96 GFR 55   NO NEW LABS.   Review of Systems  Constitutional:  Negative for  malaise/fatigue.  Respiratory:  Negative for cough and shortness of breath.   Cardiovascular:  Negative for chest pain, palpitations and leg swelling.  Gastrointestinal:  Negative for abdominal pain, constipation and heartburn.  Musculoskeletal:  Negative for back pain, joint pain and myalgias.  Skin: Negative.   Neurological:  Negative for dizziness.  Psychiatric/Behavioral:  The patient is not nervous/anxious.    Physical Exam Constitutional:      General: He is not in  acute distress.    Appearance: He is well-developed. He is not diaphoretic.  Neck:     Thyroid: No thyromegaly.  Cardiovascular:     Rate and Rhythm: Normal rate and regular rhythm.     Pulses: Normal pulses.     Heart sounds: Normal heart sounds.  Pulmonary:     Effort: Pulmonary effort is normal. No respiratory distress.     Breath sounds: Normal breath sounds.  Abdominal:     General: Bowel sounds are normal. There is no distension.     Palpations: Abdomen is soft.     Tenderness: There is no abdominal tenderness.  Musculoskeletal:     Cervical back: Neck supple.     Right lower leg: No edema.     Left lower leg: No edema.     Comments: Is able to move all extremities:  Is status post bipolar left hip 02-03-21     Lymphadenopathy:     Cervical: No cervical adenopathy.  Skin:    General: Skin is warm and dry.  Neurological:     Mental Status: He is alert. Mental status is at baseline.  Psychiatric:        Mood and Affect: Mood normal.     ASSESSMENT/ PLAN:  DISCHARGE ORDERS   Home health for pt/ot/rn to evaluate and treat as indicated for gait balance strength adl training and medication management.   2. Facility to setup follow appointment with his pcp  3. A 30 day supply of his prescription medications have [facility]  4. DME: wheelchair to allow him to maintain his current level of independence with his adls.   Time spent with patient: 45 minutes: home health; medications; dme.     Synthia Innocent NP [facility]  Contact [phone] Monday through Friday 8am- 5pm  After hours call [phone]

## 2021-03-24 DIAGNOSIS — F32A Depression, unspecified: Secondary | ICD-10-CM | POA: Diagnosis not present

## 2021-04-03 ENCOUNTER — Other Ambulatory Visit: Payer: Self-pay | Admitting: Internal Medicine

## 2021-04-18 ENCOUNTER — Encounter (HOSPITAL_COMMUNITY): Payer: Self-pay | Admitting: Emergency Medicine

## 2021-04-18 ENCOUNTER — Emergency Department (HOSPITAL_COMMUNITY)
Admission: EM | Admit: 2021-04-18 | Discharge: 2021-04-18 | Disposition: A | Discharge status: Home / Self Care | Payer: Medicare Other | Attending: Emergency Medicine | Admitting: Emergency Medicine

## 2021-04-18 ENCOUNTER — Emergency Department (HOSPITAL_COMMUNITY): Payer: Medicare Other

## 2021-04-18 ENCOUNTER — Other Ambulatory Visit: Payer: Self-pay

## 2021-04-18 DIAGNOSIS — R4182 Altered mental status, unspecified: Secondary | ICD-10-CM | POA: Diagnosis not present

## 2021-04-18 DIAGNOSIS — R531 Weakness: Secondary | ICD-10-CM | POA: Insufficient documentation

## 2021-04-18 DIAGNOSIS — R7309 Other abnormal glucose: Secondary | ICD-10-CM | POA: Insufficient documentation

## 2021-04-18 DIAGNOSIS — Z7982 Long term (current) use of aspirin: Secondary | ICD-10-CM | POA: Diagnosis not present

## 2021-04-18 LAB — COMPREHENSIVE METABOLIC PANEL
ALT: 14 U/L (ref 0–44)
AST: 13 U/L — ABNORMAL LOW (ref 15–41)
Albumin: 3.3 g/dL — ABNORMAL LOW (ref 3.5–5.0)
Alkaline Phosphatase: 47 U/L (ref 38–126)
Anion gap: 3 — ABNORMAL LOW (ref 5–15)
BUN: 33 mg/dL — ABNORMAL HIGH (ref 8–23)
CO2: 26 mmol/L (ref 22–32)
Calcium: 8.9 mg/dL (ref 8.9–10.3)
Chloride: 109 mmol/L (ref 98–111)
Creatinine, Ser: 1.59 mg/dL — ABNORMAL HIGH (ref 0.61–1.24)
GFR, Estimated: 46 mL/min — ABNORMAL LOW (ref >60)
Glucose, Bld: 85 mg/dL (ref 70–99)
Potassium: 4.5 mmol/L (ref 3.5–5.1)
Sodium: 138 mmol/L (ref 135–145)
Total Bilirubin: 0.2 mg/dL — ABNORMAL LOW (ref 0.3–1.2)
Total Protein: 6.2 g/dL — ABNORMAL LOW (ref 6.5–8.1)

## 2021-04-18 LAB — CBC WITH DIFFERENTIAL/PLATELET
Abs Immature Granulocytes: 0.03 10*3/uL (ref 0.00–0.07)
Basophils Absolute: 0.1 10*3/uL (ref 0.0–0.1)
Basophils Relative: 1 %
Eosinophils Absolute: 1.2 10*3/uL — ABNORMAL HIGH (ref 0.0–0.5)
Eosinophils Relative: 14 %
HCT: 34.7 % — ABNORMAL LOW (ref 39.0–52.0)
Hemoglobin: 11 g/dL — ABNORMAL LOW (ref 13.0–17.0)
Immature Granulocytes: 0 %
Lymphocytes Relative: 29 %
Lymphs Abs: 2.6 10*3/uL (ref 0.7–4.0)
MCH: 30.8 pg (ref 26.0–34.0)
MCHC: 31.7 g/dL (ref 30.0–36.0)
MCV: 97.2 fL (ref 80.0–100.0)
Monocytes Absolute: 0.8 10*3/uL (ref 0.1–1.0)
Monocytes Relative: 9 %
Neutro Abs: 4.1 10*3/uL (ref 1.7–7.7)
Neutrophils Relative %: 47 %
Platelets: 272 10*3/uL (ref 150–400)
RBC: 3.57 MIL/uL — ABNORMAL LOW (ref 4.22–5.81)
RDW: 13.8 % (ref 11.5–15.5)
WBC: 8.8 10*3/uL (ref 4.0–10.5)
nRBC: 0 % (ref 0.0–0.2)

## 2021-04-18 LAB — BLOOD GAS, ARTERIAL
Acid-base deficit: 2.4 mmol/L — ABNORMAL HIGH (ref 0.0–2.0)
Bicarbonate: 22.2 mmol/L (ref 20.0–28.0)
Drawn by: 27733
FIO2: 21
O2 Saturation: 95.5 %
Patient temperature: 36.5
pCO2 arterial: 41 mmHg (ref 32.0–48.0)
pH, Arterial: 7.353 (ref 7.350–7.450)
pO2, Arterial: 86.1 mmHg (ref 83.0–108.0)

## 2021-04-18 LAB — URINALYSIS, ROUTINE W REFLEX MICROSCOPIC
Bilirubin Urine: NEGATIVE
Glucose, UA: NEGATIVE mg/dL
Hgb urine dipstick: NEGATIVE
Ketones, ur: NEGATIVE mg/dL
Leukocytes,Ua: NEGATIVE
Nitrite: NEGATIVE
Protein, ur: NEGATIVE mg/dL
Specific Gravity, Urine: 1.021 (ref 1.005–1.030)
pH: 5 (ref 5.0–8.0)

## 2021-04-18 LAB — LACTIC ACID, PLASMA: Lactic Acid, Venous: 1.9 mmol/L (ref 0.5–1.9)

## 2021-04-18 LAB — CBG MONITORING, ED
Glucose-Capillary: 59 mg/dL — ABNORMAL LOW (ref 70–99)
Glucose-Capillary: 72 mg/dL (ref 70–99)
Glucose-Capillary: 72 mg/dL (ref 70–99)
Glucose-Capillary: 92 mg/dL (ref 70–99)

## 2021-04-18 MED ORDER — SODIUM CHLORIDE 0.9% FLUSH: 3.0000 mL | INTRAVENOUS | Status: DC | PRN

## 2021-04-18 MED ORDER — SODIUM CHLORIDE 0.9% FLUSH
3.0000 mL | Freq: Two times a day (BID) | INTRAVENOUS | Status: DC
  Administered 2021-04-18 (×2): 3 mL via INTRAVENOUS

## 2021-04-18 MED ORDER — SODIUM CHLORIDE 0.9 % IV SOLN: 250.0000 mL | INTRAVENOUS | Status: DC | PRN

## 2021-04-18 MED ORDER — LACTATED RINGERS IV BOLUS
1000.0000 mL | Freq: Once | INTRAVENOUS | Status: AC
  Administered 2021-04-18: 14:00:00 1000 mL via INTRAVENOUS

## 2021-04-18 NOTE — Discharge Instructions (Signed)
As discussed, your evaluation today has been largely reassuring.  But, it is important that you monitor your condition carefully, and do not hesitate to return to the ED if you develop new, or concerning changes in your condition. ? ?Otherwise, please follow-up with your physician for appropriate ongoing care. ? ?

## 2021-04-18 NOTE — ED Provider Notes (Signed)
8:38 PM Care of the patient assumed at signout.  We were awaiting urinalysis results.  These are available, unremarkable.  On exam the patient is in no distress, is calm, when informed about going home, he is amenable to this plan, has no ongoing complaints.   Gerhard Munch, MD 04/18/21 2039

## 2021-04-18 NOTE — ED Triage Notes (Signed)
Pt to the ED with [facility] from [facility] for evaluation for weakness.  The facility states he has been falling asleep in his wheelchair and has been weak. The facility stated to EMS they would like him evaluated for UTI. Pt denies pain.

## 2021-04-18 NOTE — Progress Notes (Signed)
**Note De-Identified  Obfuscation** RT note: ABG collected and delivered to lab

## 2021-04-18 NOTE — ED Notes (Signed)
Pt given protein bar

## 2021-04-18 NOTE — ED Notes (Signed)
Pt given 3 orange juices for BS

## 2021-04-18 NOTE — ED Notes (Signed)
Ambulance ride to [employer]

## 2021-04-18 NOTE — ED Provider Notes (Signed)
[facility] EMERGENCY DEPARTMENT Provider Note   CSN: [id number] Arrival date & time: 04/18/21  1253     History  Chief Complaint  Patient presents with   Weakness    Douglas Cook is a 76 y.o. male.  HPI 76 year old male presents with altered mental status.  History is somewhat from the patient but he appears confused and so history is primarily from caregiver at [facility] [place] [facility].  She states that he has been sleepier than typical this morning and early afternoon.  His glucose this morning was 150 and then 152 at noon.  He had a small amount of breakfast and was given all of his meds this morning.  Staff felt like he felt warm and was not as awake and responsive as typical and they were worried about UTI so they called EMS.  Yesterday he was his normal self.  Since coming back from the hospital at the end of 2022 he has been more disoriented and more confused than when he went in, but no changes up until today.  He typically is disoriented but normally very conversant.  Patient denies any type of pain.  He states he feels like his sugar is off, though when I was asking him what he feels like he had just had his glucose checked.  Home Medications Prior to Admission medications   Medication Sig Start Date End Date Taking? Authorizing Provider  acetaminophen (TYLENOL) 325 MG tablet Take 2 tablets (650 mg total) by mouth every 6 (six) hours as needed for mild pain (or Fever >/= 101). 03/22/21  Yes Sharee Holster, NP  alendronate (FOSAMAX) 70 MG tablet Take 70 mg by mouth once a week. 04/12/21  Yes [provider]  Alpha-Lipoic Acid 300 MG CAPS Take 1 capsule (300 mg total) by mouth daily at 12 noon. At 10 pm 03/22/21  Yes Sharee Holster, NP  aspirin EC 81 MG tablet Take 1 tablet (81 mg total) by mouth daily. Swallow whole. 03/22/21  Yes Sharee Holster, NP  atorvastatin (LIPITOR) 20 MG tablet Take 1 tablet (20 mg total) by mouth daily. Patient taking  differently: Take 40 mg by mouth daily. 03/22/21  Yes Sharee Holster, NP  cetirizine (ZYRTEC) 10 MG tablet Take 1 tablet (10 mg total) by mouth at bedtime. 03/22/21  Yes Sharee Holster, NP  cholecalciferol (VITAMIN D) 25 MCG (1000 UNIT) tablet Take 1 tablet (1,000 Units total) by mouth daily. 03/22/21  Yes Sharee Holster, NP  citalopram (CELEXA) 20 MG tablet Take 1 tablet (20 mg total) by mouth at bedtime. 03/22/21  Yes Sharee Holster, NP  FEROSUL 325 (65 Fe) MG tablet Take 1 tablet (325 mg total) by mouth 2 (two) times daily. 03/22/21  Yes Sharee Holster, NP  fluticasone (FLONASE) 50 MCG/ACT nasal spray Place 1 spray into both nostrils daily. 03/22/21  Yes Sharee Holster, NP  Ipratropium-Albuterol (COMBIVENT RESPIMAT) 20-100 MCG/ACT AERS respimat Inhale 2 puffs into the lungs 2 (two) times daily. 03/22/21  Yes Sharee Holster, NP  lamoTRIgine (LAMICTAL) 100 MG tablet Take 1 tablet (100 mg total) by mouth daily. 03/22/21  Yes Sharee Holster, NP  LEVEMIR FLEXTOUCH 100 UNIT/ML FlexTouch Pen Inject 30 Units into the skin at bedtime. 03/22/21  Yes Sharee Holster, NP  lisinopril (ZESTRIL) 2.5 MG tablet Take 1 tablet (2.5 mg total) by mouth daily. 03/22/21  Yes Sharee Holster, NP  metFORMIN (GLUCOPHAGE-XR) 500 MG 24 hr tablet  Take 2 tablets (1,000 mg total) by mouth daily with breakfast. 03/22/21  Yes Sharee Holster, NP  NOVOLOG FLEXPEN 100 UNIT/ML FlexPen Inject 8 Units into the skin 3 (three) times daily. 03/22/21  Yes [provider]  omeprazole (PRILOSEC) 20 MG capsule Take 1 capsule (20 mg total) by mouth daily. 03/22/21  Yes Sharee Holster, NP  Propylene Glycol (SYSTANE BALANCE) 0.6 % SOLN Apply 1 drop to eye 2 (two) times daily. Both eyes 03/22/21  Yes Sharee Holster, NP  risperiDONE (RISPERDAL) 0.5 MG tablet Take 1 tablet (0.5 mg total) by mouth at bedtime. 03/22/21  Yes Sharee Holster, NP  senna-docusate (SENOKOT-S) 8.6-50 MG tablet Take 1 tablet by mouth at  bedtime as needed for mild constipation. 03/22/21  Yes Sharee Holster, NP  tamsulosin (FLOMAX) 0.4 MG CAPS capsule Take 1 capsule (0.4 mg total) by mouth daily. 03/22/21  Yes Sharee Holster, NP  TRADJENTA 5 MG TABS tablet Take 1 tablet (5 mg total) by mouth daily. 03/22/21  Yes Sharee Holster, NP  traZODone (DESYREL) 100 MG tablet Take 1 tablet (100 mg total) by mouth at bedtime. 03/22/21  Yes Sharee Holster, NP  feeding supplement, GLUCERNA SHAKE, (GLUCERNA SHAKE) LIQD Take 237 mLs by mouth daily. Patient not taking: Reported on 04/18/2021 03/22/21   Sharee Holster, NP      Allergies    Patient has no known allergies.    Review of Systems   Review of Systems  Unable to perform ROS: Mental status change   Physical Exam Updated Vital Signs BP (!) 108/55    Pulse 92    Temp 97.8 F (36.6 C) (Rectal)    Resp 12    Ht 6' (1.829 m)    Wt 83.1 kg    SpO2 100%    BMI 24.85 kg/m  Physical Exam Vitals and nursing note reviewed.  Constitutional:      Appearance: He is well-developed.  HENT:     Head: Normocephalic and atraumatic.  Cardiovascular:     Rate and Rhythm: Normal rate and regular rhythm.     Heart sounds: Normal heart sounds.  Pulmonary:     Effort: Pulmonary effort is normal.     Breath sounds: Normal breath sounds.  Abdominal:     Palpations: Abdomen is soft.     Tenderness: There is no abdominal tenderness.  Skin:    General: Skin is warm and dry.  Neurological:     Mental Status: He is alert.    ED Results / Procedures / Treatments   Labs (all labs ordered are listed, but only abnormal results are displayed) Labs Reviewed  COMPREHENSIVE METABOLIC PANEL - Abnormal; Notable for the following components:      Result Value   BUN 33 (*)    Creatinine, Ser 1.59 (*)    Total Protein 6.2 (*)    Albumin 3.3 (*)    AST 13 (*)    Total Bilirubin 0.2 (*)    GFR, Estimated 46 (*)    Anion gap 3 (*)    All other components within normal limits  CBC WITH  DIFFERENTIAL/PLATELET - Abnormal; Notable for the following components:   RBC 3.57 (*)    Hemoglobin 11.0 (*)    HCT 34.7 (*)    Eosinophils Absolute 1.2 (*)    All other components within normal limits  BLOOD GAS, ARTERIAL - Abnormal; Notable for the following components:   Acid-base deficit 2.4 (*)  All other components within normal limits  CBG MONITORING, ED - Abnormal; Notable for the following components:   Glucose-Capillary 59 (*)    All other components within normal limits  LACTIC ACID, PLASMA  URINALYSIS, ROUTINE W REFLEX MICROSCOPIC  CBG MONITORING, ED  CBG MONITORING, ED  CBG MONITORING, ED    EKG None  Radiology CT Head Wo Contrast  Result Date: 04/18/2021 CLINICAL DATA:  Altered mental status, weakness and somnolence. EXAM: CT HEAD WITHOUT CONTRAST TECHNIQUE: Contiguous axial images were obtained from the base of the skull through the vertex without intravenous contrast. RADIATION DOSE REDUCTION: This exam was performed according to the departmental dose-optimization program which includes automated exposure control, adjustment of the mA and/or kV according to patient size and/or use of iterative reconstruction technique. COMPARISON:  02/01/2021 FINDINGS: Brain: Chronic ventriculomegaly noted including temporal horn dilatation, much of this may be ex vacuo, strictly speaking normal pressure hydrocephalus is not readily excluded. Periventricular white matter hypodensity is stable and likely primarily from chronic ischemic microvascular white matter disease. Otherwise, the brainstem, cerebellum, cerebral peduncles, thalamus, basal ganglia, basilar cisterns, and ventricular system appear within normal limits. No intracranial hemorrhage, mass lesion, or acute CVA. Vascular: Unremarkable Skull: Unremarkable Sinuses/Orbits: Unremarkable Other: No supplemental non-categorized findings. IMPRESSION: 1. No acute findings and no change from prior exams. 2. Ventriculomegaly may be ex  vacuo, but strictly speaking normal pressure hydrocephalus is not completely excluded. 3. Periventricular white matter and corona radiata hypodensities favor chronic ischemic microvascular white matter disease. Electronically Signed   By: Gaylyn RongWalter  Liebkemann M.D.   On: 04/18/2021 15:27   DG Chest Portable 1 View  Result Date: 04/18/2021 CLINICAL DATA:  Altered mental status. Chest x-ray dated February 04, 2021 EXAM: PORTABLE CHEST 1 VIEW COMPARISON:  Chest x-ray dated February 04, 2021 FINDINGS: The heart size and mediastinal contours are within normal limits. No acute parenchymal opacity. The visualized skeletal structures are unremarkable. IMPRESSION: No active disease. Electronically Signed   By: Allegra LaiLeah  Strickland M.D.   On: 04/18/2021 14:12    Procedures Procedures    Medications Ordered in ED Medications  sodium chloride flush (NS) 0.9 % injection 3 mL (3 mLs Intravenous Given 04/18/21 1406)  sodium chloride flush (NS) 0.9 % injection 3 mL (has no administration in time range)  0.9 %  sodium chloride infusion (has no administration in time range)  lactated ringers bolus 1,000 mL (1,000 mLs Intravenous New Bag/Given 04/18/21 1406)    ED Course/ Medical Decision Making/ A&P                           Medical Decision Making Amount and/or Complexity of Data Reviewed Labs: ordered. Radiology: ordered.  Risk Prescription drug management.   Patient's chart is been reviewed.  He was discharged in November to skilled nursing per his most recent discharge summary.  Here he is pleasantly confused.  This sounds slightly off from his baseline.  Chest x-ray has been personally reviewed and interpreted and shows no pneumonia.  Labs show a slightly worsening creatinine than baseline but it is minimal.  No leukocytosis.  His glucose was slightly low on arrival but came up with peanut butter and juice.  He will need to be monitored.  Urinalysis is still pending.  CT head has been ordered as well because  of the altered mental status.  However I suspect he could go back if there are no emergent findings and his glucose is stable. Care to  Dr. Jeraldine LootsLockwood.        Final Clinical Impression(s) / ED Diagnoses Final diagnoses:  None    Rx / DC Orders ED Discharge Orders     None         Pricilla LovelessGoldston, Fread Kottke, MD 04/18/21 [phone]

## 2021-04-18 NOTE — ED Notes (Addendum)
Gave pt 2 packets of peanut butter & 2 orange juices per RN for CBG being low.

## 2021-05-04 ENCOUNTER — Other Ambulatory Visit: Payer: Self-pay

## 2021-05-04 ENCOUNTER — Ambulatory Visit (INDEPENDENT_AMBULATORY_CARE_PROVIDER_SITE_OTHER): Payer: Medicare Other | Admitting: Orthopedic Surgery

## 2021-05-04 ENCOUNTER — Encounter: Payer: Medicare Other | Admitting: Orthopedic Surgery

## 2021-05-04 DIAGNOSIS — S72002D Fracture of unspecified part of neck of left femur, subsequent encounter for closed fracture with routine healing: Secondary | ICD-10-CM

## 2021-05-04 NOTE — Progress Notes (Signed)
Chief Complaint  Patient presents with   Hip Injury    LT hip/ DOS 02/03/21 Closed fracture of neck of left femur    76 year old male neck fracture multiple medical problems including COPD CKD anemia resides at  Nursing home status post bipolar hip replacement  Clinical exam was benign  Patient was released to normal activity no follow-up needed

## 2021-05-08 ENCOUNTER — Encounter: Payer: Medicare Other | Admitting: Orthopedic Surgery

## 2021-05-09 ENCOUNTER — Emergency Department (HOSPITAL_COMMUNITY): Payer: Medicare Other

## 2021-05-09 ENCOUNTER — Inpatient Hospital Stay (HOSPITAL_COMMUNITY)
Admission: EM | Admit: 2021-05-09 | Discharge: 2021-05-11 | DRG: 872 | Disposition: A | Discharge status: Nursing Home (Includes ICF) | Payer: Medicare Other | Source: Skilled Nursing Facility | Attending: Internal Medicine | Admitting: Internal Medicine

## 2021-05-09 ENCOUNTER — Other Ambulatory Visit: Payer: Self-pay

## 2021-05-09 ENCOUNTER — Encounter (HOSPITAL_COMMUNITY): Payer: Self-pay

## 2021-05-09 DIAGNOSIS — N1831 Chronic kidney disease, stage 3a: Secondary | ICD-10-CM | POA: Diagnosis present

## 2021-05-09 DIAGNOSIS — Z79899 Other long term (current) drug therapy: Secondary | ICD-10-CM | POA: Diagnosis not present

## 2021-05-09 DIAGNOSIS — Z7984 Long term (current) use of oral hypoglycemic drugs: Secondary | ICD-10-CM | POA: Diagnosis not present

## 2021-05-09 DIAGNOSIS — F17290 Nicotine dependence, other tobacco product, uncomplicated: Secondary | ICD-10-CM | POA: Diagnosis present

## 2021-05-09 DIAGNOSIS — F32A Depression, unspecified: Secondary | ICD-10-CM | POA: Diagnosis present

## 2021-05-09 DIAGNOSIS — J449 Chronic obstructive pulmonary disease, unspecified: Secondary | ICD-10-CM | POA: Diagnosis present

## 2021-05-09 DIAGNOSIS — I10 Essential (primary) hypertension: Secondary | ICD-10-CM | POA: Diagnosis not present

## 2021-05-09 DIAGNOSIS — E1122 Type 2 diabetes mellitus with diabetic chronic kidney disease: Secondary | ICD-10-CM | POA: Diagnosis present

## 2021-05-09 DIAGNOSIS — K219 Gastro-esophageal reflux disease without esophagitis: Secondary | ICD-10-CM | POA: Diagnosis present

## 2021-05-09 DIAGNOSIS — E785 Hyperlipidemia, unspecified: Secondary | ICD-10-CM | POA: Diagnosis present

## 2021-05-09 DIAGNOSIS — A419 Sepsis, unspecified organism: Secondary | ICD-10-CM | POA: Diagnosis present

## 2021-05-09 DIAGNOSIS — Z7982 Long term (current) use of aspirin: Secondary | ICD-10-CM | POA: Diagnosis not present

## 2021-05-09 DIAGNOSIS — Z96642 Presence of left artificial hip joint: Secondary | ICD-10-CM | POA: Diagnosis present

## 2021-05-09 DIAGNOSIS — Z20822 Contact with and (suspected) exposure to covid-19: Secondary | ICD-10-CM | POA: Diagnosis present

## 2021-05-09 DIAGNOSIS — Z823 Family history of stroke: Secondary | ICD-10-CM | POA: Diagnosis not present

## 2021-05-09 DIAGNOSIS — F0390 Unspecified dementia without behavioral disturbance: Secondary | ICD-10-CM | POA: Diagnosis present

## 2021-05-09 DIAGNOSIS — Z794 Long term (current) use of insulin: Secondary | ICD-10-CM

## 2021-05-09 DIAGNOSIS — R4189 Other symptoms and signs involving cognitive functions and awareness: Secondary | ICD-10-CM | POA: Diagnosis present

## 2021-05-09 DIAGNOSIS — Z7983 Long term (current) use of bisphosphonates: Secondary | ICD-10-CM | POA: Diagnosis not present

## 2021-05-09 DIAGNOSIS — A4151 Sepsis due to Escherichia coli [E. coli]: Principal | ICD-10-CM | POA: Diagnosis present

## 2021-05-09 DIAGNOSIS — F0393 Unspecified dementia, unspecified severity, with mood disturbance: Secondary | ICD-10-CM | POA: Diagnosis present

## 2021-05-09 DIAGNOSIS — Z833 Family history of diabetes mellitus: Secondary | ICD-10-CM | POA: Diagnosis not present

## 2021-05-09 DIAGNOSIS — E782 Mixed hyperlipidemia: Secondary | ICD-10-CM | POA: Diagnosis present

## 2021-05-09 DIAGNOSIS — N390 Urinary tract infection, site not specified: Secondary | ICD-10-CM

## 2021-05-09 DIAGNOSIS — I129 Hypertensive chronic kidney disease with stage 1 through stage 4 chronic kidney disease, or unspecified chronic kidney disease: Secondary | ICD-10-CM | POA: Diagnosis present

## 2021-05-09 DIAGNOSIS — R29818 Other symptoms and signs involving the nervous system: Secondary | ICD-10-CM | POA: Diagnosis present

## 2021-05-09 DIAGNOSIS — N1832 Chronic kidney disease, stage 3b: Secondary | ICD-10-CM | POA: Diagnosis not present

## 2021-05-09 DIAGNOSIS — N10 Acute tubulo-interstitial nephritis: Secondary | ICD-10-CM

## 2021-05-09 DIAGNOSIS — E119 Type 2 diabetes mellitus without complications: Secondary | ICD-10-CM

## 2021-05-09 LAB — COMPREHENSIVE METABOLIC PANEL
ALT: 14 U/L (ref 0–44)
AST: 14 U/L — ABNORMAL LOW (ref 15–41)
Albumin: 3.6 g/dL (ref 3.5–5.0)
Alkaline Phosphatase: 41 U/L (ref 38–126)
Anion gap: 11 (ref 5–15)
BUN: 25 mg/dL — ABNORMAL HIGH (ref 8–23)
CO2: 26 mmol/L (ref 22–32)
Calcium: 9.1 mg/dL (ref 8.9–10.3)
Chloride: 102 mmol/L (ref 98–111)
Creatinine, Ser: 1.38 mg/dL — ABNORMAL HIGH (ref 0.61–1.24)
GFR, Estimated: 54 mL/min — ABNORMAL LOW (ref >60)
Glucose, Bld: 197 mg/dL — ABNORMAL HIGH (ref 70–99)
Potassium: 4.3 mmol/L (ref 3.5–5.1)
Sodium: 139 mmol/L (ref 135–145)
Total Bilirubin: 0.5 mg/dL (ref 0.3–1.2)
Total Protein: 6.6 g/dL (ref 6.5–8.1)

## 2021-05-09 LAB — RESP PANEL BY RT-PCR (FLU A&B, COVID) ARPGX2
Influenza A by PCR: NEGATIVE
Influenza B by PCR: NEGATIVE
SARS Coronavirus 2 by RT PCR: NEGATIVE

## 2021-05-09 LAB — URINALYSIS, ROUTINE W REFLEX MICROSCOPIC
Bilirubin Urine: NEGATIVE
Glucose, UA: NEGATIVE mg/dL
Ketones, ur: NEGATIVE mg/dL
Nitrite: NEGATIVE
Protein, ur: NEGATIVE mg/dL
RBC / HPF: >50 RBC/hpf — ABNORMAL HIGH (ref 0–5)
Specific Gravity, Urine: 1.014 (ref 1.005–1.030)
WBC, UA: >50 WBC/hpf — ABNORMAL HIGH (ref 0–5)
pH: 6 (ref 5.0–8.0)

## 2021-05-09 LAB — CBC WITH DIFFERENTIAL/PLATELET
Abs Immature Granulocytes: 0.09 10*3/uL — ABNORMAL HIGH (ref 0.00–0.07)
Basophils Absolute: 0.1 10*3/uL (ref 0.0–0.1)
Basophils Relative: 1 %
Eosinophils Absolute: 0.6 10*3/uL — ABNORMAL HIGH (ref 0.0–0.5)
Eosinophils Relative: 4 %
HCT: 34.4 % — ABNORMAL LOW (ref 39.0–52.0)
Hemoglobin: 11.1 g/dL — ABNORMAL LOW (ref 13.0–17.0)
Immature Granulocytes: 1 %
Lymphocytes Relative: 8 %
Lymphs Abs: 1.2 10*3/uL (ref 0.7–4.0)
MCH: 29.8 pg (ref 26.0–34.0)
MCHC: 32.3 g/dL (ref 30.0–36.0)
MCV: 92.2 fL (ref 80.0–100.0)
Monocytes Absolute: 1 10*3/uL (ref 0.1–1.0)
Monocytes Relative: 7 %
Neutro Abs: 11.6 10*3/uL — ABNORMAL HIGH (ref 1.7–7.7)
Neutrophils Relative %: 79 %
Platelets: 232 10*3/uL (ref 150–400)
RBC: 3.73 MIL/uL — ABNORMAL LOW (ref 4.22–5.81)
RDW: 13.7 % (ref 11.5–15.5)
WBC: 14.5 10*3/uL — ABNORMAL HIGH (ref 4.0–10.5)
nRBC: 0 % (ref 0.0–0.2)

## 2021-05-09 LAB — GLUCOSE, CAPILLARY: Glucose-Capillary: 173 mg/dL — ABNORMAL HIGH (ref 70–99)

## 2021-05-09 LAB — LIPASE, BLOOD: Lipase: 37 U/L (ref 11–51)

## 2021-05-09 LAB — LACTIC ACID, PLASMA: Lactic Acid, Venous: 1.2 mmol/L (ref 0.5–1.9)

## 2021-05-09 LAB — PROTIME-INR
INR: 1 (ref 0.8–1.2)
Prothrombin Time: 12.8 seconds (ref 11.4–15.2)

## 2021-05-09 MED ORDER — ONDANSETRON HCL 4 MG PO TABS: 4.0000 mg | ORAL_TABLET | Freq: Four times a day (QID) | ORAL | Status: DC | PRN

## 2021-05-09 MED ORDER — RISPERIDONE 0.5 MG PO TABS
0.5000 mg | ORAL_TABLET | Freq: Every day | ORAL | Status: DC
  Administered 2021-05-09 – 2021-05-10 (×2): 0.5 mg via ORAL
  Filled 2021-05-09 (×2): qty 1

## 2021-05-09 MED ORDER — POLYETHYLENE GLYCOL 3350 17 G PO PACK: 17.0000 g | PACK | Freq: Every day | ORAL | Status: DC | PRN

## 2021-05-09 MED ORDER — VANCOMYCIN HCL 1750 MG/350ML IV SOLN
1750.0000 mg | Freq: Once | INTRAVENOUS | Status: AC
  Administered 2021-05-09: 1750 mg via INTRAVENOUS
  Filled 2021-05-09: qty 350

## 2021-05-09 MED ORDER — ACETAMINOPHEN 650 MG RE SUPP: 650.0000 mg | Freq: Four times a day (QID) | RECTAL | Status: DC | PRN

## 2021-05-09 MED ORDER — ONDANSETRON HCL 4 MG/2ML IJ SOLN: 4.0000 mg | Freq: Four times a day (QID) | INTRAMUSCULAR | Status: DC | PRN

## 2021-05-09 MED ORDER — HEPARIN SODIUM (PORCINE) 5000 UNIT/ML IJ SOLN
5000.0000 [IU] | Freq: Three times a day (TID) | INTRAMUSCULAR | Status: DC
  Administered 2021-05-09 – 2021-05-11 (×5): 5000 [IU] via SUBCUTANEOUS
  Filled 2021-05-09 (×5): qty 1

## 2021-05-09 MED ORDER — ASPIRIN EC 81 MG PO TBEC
81.0000 mg | DELAYED_RELEASE_TABLET | Freq: Every day | ORAL | Status: DC
Start: 2021-05-09 — End: 2021-05-11
  Administered 2021-05-09 – 2021-05-11 (×3): 81 mg via ORAL
  Filled 2021-05-09 (×3): qty 1

## 2021-05-09 MED ORDER — LACTATED RINGERS IV SOLN: INTRAVENOUS | Status: DC

## 2021-05-09 MED ORDER — PANTOPRAZOLE SODIUM 40 MG PO TBEC
40.0000 mg | DELAYED_RELEASE_TABLET | Freq: Every day | ORAL | Status: DC
  Administered 2021-05-09 – 2021-05-11 (×3): 40 mg via ORAL
  Filled 2021-05-09 (×3): qty 1

## 2021-05-09 MED ORDER — SODIUM CHLORIDE 0.9 % IV SOLN: 2.0000 g | Freq: Two times a day (BID) | INTRAVENOUS | Status: DC

## 2021-05-09 MED ORDER — LISINOPRIL 5 MG PO TABS
2.5000 mg | ORAL_TABLET | Freq: Every day | ORAL | Status: DC
  Administered 2021-05-09 – 2021-05-11 (×3): 2.5 mg via ORAL
  Filled 2021-05-09 (×3): qty 1

## 2021-05-09 MED ORDER — VANCOMYCIN HCL IN DEXTROSE 1-5 GM/200ML-% IV SOLN
1000.0000 mg | Freq: Once | INTRAVENOUS | Status: DC
Start: 2021-05-09 — End: 2021-05-09

## 2021-05-09 MED ORDER — SODIUM CHLORIDE 0.9 % IV SOLN
250.0000 mL | INTRAVENOUS | Status: DC | PRN
  Administered 2021-05-09: 250 mL via INTRAVENOUS

## 2021-05-09 MED ORDER — SODIUM CHLORIDE 0.9% FLUSH: 3.0000 mL | INTRAVENOUS | Status: DC | PRN

## 2021-05-09 MED ORDER — VANCOMYCIN HCL 1500 MG/300ML IV SOLN: 1500.0000 mg | INTRAVENOUS | Status: DC

## 2021-05-09 MED ORDER — LACTATED RINGERS IV BOLUS (SEPSIS)
500.0000 mL | Freq: Once | INTRAVENOUS | Status: AC
  Administered 2021-05-09: 500 mL via INTRAVENOUS

## 2021-05-09 MED ORDER — SODIUM CHLORIDE 0.9 % IV SOLN
1.0000 g | INTRAVENOUS | Status: DC
  Administered 2021-05-09 – 2021-05-10 (×2): 1 g via INTRAVENOUS
  Filled 2021-05-09 (×2): qty 10

## 2021-05-09 MED ORDER — CITALOPRAM HYDROBROMIDE 20 MG PO TABS
20.0000 mg | ORAL_TABLET | Freq: Every day | ORAL | Status: DC
  Administered 2021-05-09 – 2021-05-10 (×2): 20 mg via ORAL
  Filled 2021-05-09 (×2): qty 1

## 2021-05-09 MED ORDER — TRAZODONE HCL 50 MG PO TABS
100.0000 mg | ORAL_TABLET | Freq: Every day | ORAL | Status: DC
  Administered 2021-05-09 – 2021-05-10 (×2): 100 mg via ORAL
  Filled 2021-05-09 (×2): qty 2

## 2021-05-09 MED ORDER — INSULIN GLARGINE-YFGN 100 UNIT/ML ~~LOC~~ SOLN
30.0000 [IU] | Freq: Every day | SUBCUTANEOUS | Status: DC
  Administered 2021-05-09 – 2021-05-10 (×2): 30 [IU] via SUBCUTANEOUS
  Filled 2021-05-09 (×3): qty 0.3

## 2021-05-09 MED ORDER — LACTATED RINGERS IV BOLUS (SEPSIS)
2000.0000 mL | Freq: Once | INTRAVENOUS | Status: AC
  Administered 2021-05-09: 2000 mL via INTRAVENOUS

## 2021-05-09 MED ORDER — SODIUM CHLORIDE 0.9 % IV SOLN
2.0000 g | Freq: Once | INTRAVENOUS | Status: AC
  Administered 2021-05-09: 2 g via INTRAVENOUS
  Filled 2021-05-09: qty 2

## 2021-05-09 MED ORDER — INSULIN ASPART 100 UNIT/ML IJ SOLN
0.0000 [IU] | Freq: Three times a day (TID) | INTRAMUSCULAR | Status: DC
  Administered 2021-05-10: 2 [IU] via SUBCUTANEOUS
  Administered 2021-05-10: 1 [IU] via SUBCUTANEOUS
  Administered 2021-05-10: 2 [IU] via SUBCUTANEOUS
  Administered 2021-05-11: 1 [IU] via SUBCUTANEOUS
  Administered 2021-05-11: 3 [IU] via SUBCUTANEOUS

## 2021-05-09 MED ORDER — ACETAMINOPHEN 325 MG PO TABS
650.0000 mg | ORAL_TABLET | Freq: Once | ORAL | Status: AC
  Administered 2021-05-09: 650 mg via ORAL
  Filled 2021-05-09: qty 2

## 2021-05-09 MED ORDER — SODIUM CHLORIDE 0.9% FLUSH
3.0000 mL | Freq: Two times a day (BID) | INTRAVENOUS | Status: DC
  Administered 2021-05-09 – 2021-05-11 (×4): 3 mL via INTRAVENOUS

## 2021-05-09 MED ORDER — METRONIDAZOLE 500 MG/100ML IV SOLN
500.0000 mg | Freq: Once | INTRAVENOUS | Status: AC
  Administered 2021-05-09: 500 mg via INTRAVENOUS
  Filled 2021-05-09: qty 100

## 2021-05-09 MED ORDER — ATORVASTATIN CALCIUM 40 MG PO TABS
40.0000 mg | ORAL_TABLET | Freq: Every day | ORAL | Status: DC
  Administered 2021-05-09 – 2021-05-11 (×3): 40 mg via ORAL
  Filled 2021-05-09 (×3): qty 1

## 2021-05-09 MED ORDER — LAMOTRIGINE 100 MG PO TABS
100.0000 mg | ORAL_TABLET | Freq: Every day | ORAL | Status: DC
Start: 2021-05-09 — End: 2021-05-11
  Administered 2021-05-09 – 2021-05-11 (×3): 100 mg via ORAL
  Filled 2021-05-09 (×3): qty 1

## 2021-05-09 MED ORDER — TAMSULOSIN HCL 0.4 MG PO CAPS
0.4000 mg | ORAL_CAPSULE | Freq: Every day | ORAL | Status: DC
  Administered 2021-05-09 – 2021-05-11 (×3): 0.4 mg via ORAL
  Filled 2021-05-09 (×3): qty 1

## 2021-05-09 MED ORDER — ACETAMINOPHEN 325 MG PO TABS: 650.0000 mg | ORAL_TABLET | Freq: Four times a day (QID) | ORAL | Status: DC | PRN

## 2021-05-09 NOTE — Assessment & Plan Note (Addendum)
-  Continue the use of Lipitor  -Heart healthy diet advised. -Continue to follow LFTs and lipid panel periodically, with further adjustment to therapy as required.

## 2021-05-09 NOTE — H&P (Addendum)
History and Physical    Patient: Douglas Cook [id number] DOB: 12-Apr-1945 DOA: 05/09/2021 DOS: the patient was seen and examined on 05/09/2021 PCP: Glenda Chroman, MD  Patient coming from: SNF   Chief Complaint:  Chief Complaint  Patient presents with   Fever    HPI: Douglas Cook is a 76 y.o. male with medical history significant of CKD stage III, diabetes, hyperlipidemia, hypertension who presented from skilled nursing facility secondary to fever.  He has some baseline memory loss and is a poor historian overall.  Admits to having some left lower abdominal pain, dysuria and fever.  Also admits to nausea, vomiting, diarrhea.  Review of Systems: unable to review all systems due to the inability of the patient to answer questions. 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 He has never used smokeless tobacco. He reports that he does not drink alcohol and does not use drugs.  No Known Allergies  Family History  Problem Relation Age of Onset   Stroke Mother    Diabetes Father     Prior to Admission medications   Medication Sig Start Date End Date Taking? Authorizing Provider  acetaminophen (TYLENOL) 325 MG tablet Take 2 tablets (650 mg total) by mouth every 6 (six) hours as needed for mild pain (or Fever >/= 101). 03/22/21   Gerlene Fee, NP  alendronate (FOSAMAX) 70 MG tablet Take 70 mg by mouth once  a week. 04/12/21   [provider]  Alpha-Lipoic Acid 300 MG CAPS Take 1 capsule (300 mg total) by mouth daily at 12 noon. At 10 pm 03/22/21   Gerlene Fee, NP  aspirin EC 81 MG tablet Take 1 tablet (81 mg total) by mouth daily. Swallow whole. 03/22/21   Gerlene Fee, NP  atorvastatin (LIPITOR) 20 MG tablet Take 1 tablet (20 mg total) by mouth daily. Patient taking differently: Take 40 mg by mouth daily. 03/22/21   Gerlene Fee, NP  cetirizine (ZYRTEC) 10 MG tablet Take 1 tablet (10 mg total) by mouth at bedtime. 03/22/21   Gerlene Fee, NP  cholecalciferol (VITAMIN D) 25 MCG (1000 UNIT) tablet Take 1 tablet (1,000 Units total) by mouth daily. 03/22/21   Gerlene Fee, NP  citalopram (CELEXA) 20 MG tablet Take 1 tablet (20 mg total) by mouth at bedtime. 03/22/21   Gerlene Fee, NP  feeding supplement, GLUCERNA SHAKE, (GLUCERNA SHAKE) LIQD Take 237 mLs by mouth daily. 03/22/21   Gerlene Fee, NP  FEROSUL 325 (65 Fe) MG tablet Take 1 tablet (325 mg total) by mouth 2 (two) times daily. 03/22/21   Gerlene Fee, NP  fluticasone (FLONASE) 50 MCG/ACT nasal spray Place 1 spray into both nostrils daily. 03/22/21   Gerlene Fee, NP  Ipratropium-Albuterol (COMBIVENT RESPIMAT) 20-100 MCG/ACT AERS respimat Inhale 2 puffs into the lungs 2 (two) times  daily. 03/22/21   Gerlene Fee, NP  lamoTRIgine (LAMICTAL) 100 MG tablet Take 1 tablet (100 mg total) by mouth daily. 03/22/21   Gerlene Fee, NP  LEVEMIR FLEXTOUCH 100 UNIT/ML FlexTouch Pen Inject 30 Units into the skin at bedtime. 03/22/21   Gerlene Fee, NP  lisinopril (ZESTRIL) 2.5 MG tablet Take 1 tablet (2.5 mg total) by mouth daily. 03/22/21   Gerlene Fee, NP  metFORMIN (GLUCOPHAGE-XR) 500 MG 24 hr tablet Take 2 tablets (1,000 mg total) by mouth daily with breakfast. 03/22/21   Gerlene Fee, NP  NOVOLOG FLEXPEN 100 UNIT/ML FlexPen Inject 8 Units into the skin 3 (three) times daily. 03/22/21    [provider]  omeprazole (PRILOSEC) 20 MG capsule Take 1 capsule (20 mg total) by mouth daily. 03/22/21   Gerlene Fee, NP  Propylene Glycol (SYSTANE BALANCE) 0.6 % SOLN Apply 1 drop to eye 2 (two) times daily. Both eyes 03/22/21   Gerlene Fee, NP  risperiDONE (RISPERDAL) 0.5 MG tablet Take 1 tablet (0.5 mg total) by mouth at bedtime. 03/22/21   Gerlene Fee, NP  senna-docusate (SENOKOT-S) 8.6-50 MG tablet Take 1 tablet by mouth at bedtime as needed for mild constipation. 03/22/21   Gerlene Fee, NP  tamsulosin (FLOMAX) 0.4 MG CAPS capsule Take 1 capsule (0.4 mg total) by mouth daily. 03/22/21   Gerlene Fee, NP  TRADJENTA 5 MG TABS tablet Take 1 tablet (5 mg total) by mouth daily. 03/22/21   Gerlene Fee, NP  traZODone (DESYREL) 100 MG tablet Take 1 tablet (100 mg total) by mouth at bedtime. 03/22/21   Gerlene Fee, NP    Physical Exam: Vitals:   05/09/21 1017 05/09/21 1100 05/09/21 1200 05/09/21 1321  BP:  124/60 116/76 126/69  Pulse: (!) 110 (!) 108 (!) 109 (!) 101  Resp: 20 (!) 21 (!) 26 18  Temp: (!) 101.3 F (38.5 C)     TempSrc: Rectal     SpO2: 97% 96% 94% 94%  Weight:      Height:       Examination: General exam: Appears calm and comfortable, has resting tremor Respiratory system: Clear to auscultation. Respiratory effort normal. Cardiovascular system: S1 & S2 heard, tachycardic, regular rhythm Gastrointestinal system: Abdomen is nondistended, soft  Central nervous system: Alert and oriented to self and place only Extremities: Symmetric in appearance bilaterally  Skin: No rashes, lesions or ulcers on exposed skin  Psychiatry: Mood & affect appropriate.   Data Reviewed:   Cr 1.38, WBC 14.5, covid/flu negative, UA large leukocytes, many bacteria, >50 WBC. CXR unremarkable.   Assessment and Plan: * Sepsis secondary to UTI [facility])- (present on admission) Sepsis POA Urine culture, blood cultures pending Lactic acid 1.2   Rocephin  Neurocognitive deficits- (present on admission) Resides at [facility] Sister Douglas Cook is his main point of contact/blood relative who lives in [place]. Has a son and ex-wife who are not in contact.   HTN (hypertension)- (present on admission) Lisinopril   HLD (hyperlipidemia)- (present on admission) Lipitor   Type 2 diabetes mellitus (HCC) [place], SSI   Chronic kidney disease, stage 3a ([place])- (present on admission) Baseline Cr 1.4 Stable        Advance Care Planning:   Code Status: Prior   Consults: None   Family Communication: Sister over the phone   Severity of Illness: The appropriate patient status for this patient is INPATIENT. Inpatient status is judged to be reasonable and necessary in  order to provide the required intensity of service to ensure the patient's safety. The patient's presenting symptoms, physical exam findings, and initial radiographic and laboratory data in the context of their chronic comorbidities is felt to place them at high risk for further clinical deterioration. Furthermore, it is not anticipated that the patient will be medically stable for discharge from the hospital within 2 midnights of admission.   * I certify that at the point of admission it is my clinical judgment that the patient will require inpatient hospital care spanning beyond 2 midnights from the point of admission due to high intensity of service, high risk for further deterioration and high frequency of surveillance required.*  Author: Dessa Phi, DO 05/09/2021 2:59 PM  For on call review [email or web address].

## 2021-05-09 NOTE — Assessment & Plan Note (Addendum)
-  Resides at ALF -Sister Douglas Cook is his main point of contact/blood relative who lives in [place]. -Has a son and ex-wife who are not in contact.  -Continue supportive care and return back to assisted living facility when medically stable.

## 2021-05-09 NOTE — Assessment & Plan Note (Addendum)
-  Baseline Cr 1.4 -Stable/further improved with fluid resuscitation -Current creatinine level 1.27 -Continue to avoid nephrotoxic agents and maintain adequate hydration. -Patient with possible mild component acute kidney injury in the setting of dehydration and acute UTI.

## 2021-05-09 NOTE — ED Notes (Signed)
Nurse had successful insertion with in and out, but unable to collect the urine. Bladder scan showed 479mL. MD aware. Waiting on orders.

## 2021-05-09 NOTE — ED Provider Notes (Signed)
[facility] EMERGENCY DEPARTMENT Provider Note   CSN: [id number] Arrival date & time: 05/09/21  0848     History  Chief Complaint  Patient presents with   Fever    Douglas Cook is a 76 y.o. male.  HPI Patient presents for fever.  Medical history is notable for CKD, COPD, T2DM, GERD, HLD, depression, HTN.  He currently resides at [facility] long-term care.  This morning, he had a reported fever of 102 degrees.  He also had acute on chronic confusion.  EMS was called.  Emesis recorded a temperature of 100.8 degrees.  Vital signs during transit were otherwise notable for mild tachycardia and SPO2 of 92% on room air.  Patient denies any pain during transit.  On arrival, he does endorse some lower abdominal pain.  History is limited by the patient's dementia as well as his reported acute on chronic confusion.    Home Medications Prior to Admission medications   Medication Sig Start Date End Date Taking? Authorizing Provider  acetaminophen (TYLENOL) 325 MG tablet Take 2 tablets (650 mg total) by mouth every 6 (six) hours as needed for mild pain (or Fever >/= 101). 03/22/21  Yes Gerlene Fee, NP  albuterol (VENTOLIN HFA) 108 (90 Base) MCG/ACT inhaler Inhale 1-2 puffs into the lungs every 4 (four) hours as needed for wheezing or shortness of breath.   Yes [provider]  alendronate (FOSAMAX) 70 MG tablet Take 70 mg by mouth once a week. 04/12/21  Yes [provider]  Alpha-Lipoic Acid 300 MG CAPS Take 1 capsule (300 mg total) by mouth daily at 12 noon. At 10 pm 03/22/21  Yes Gerlene Fee, NP  aspirin EC 81 MG tablet Take 1 tablet (81 mg total) by mouth daily. Swallow whole. 03/22/21  Yes Gerlene Fee, NP  atorvastatin (LIPITOR) 20 MG tablet Take 1 tablet (20 mg total) by mouth daily. Patient taking differently: Take 40 mg by mouth daily. 03/22/21  Yes Gerlene Fee, NP  cefdinir (OMNICEF) 300 MG capsule Take 1 capsule (300 mg total) by mouth 2 (two) times  daily for 6 days. 05/11/21 05/17/21 Yes Barton Dubois, MD  cetirizine (ZYRTEC) 10 MG tablet Take 1 tablet (10 mg total) by mouth at bedtime. 03/22/21  Yes Gerlene Fee, NP  cholecalciferol (VITAMIN D) 25 MCG (1000 UNIT) tablet Take 1 tablet (1,000 Units total) by mouth daily. 03/22/21  Yes Gerlene Fee, NP  citalopram (CELEXA) 20 MG tablet Take 1 tablet (20 mg total) by mouth at bedtime. 03/22/21  Yes Gerlene Fee, NP  feeding supplement, GLUCERNA SHAKE, (GLUCERNA SHAKE) LIQD Take 237 mLs by mouth daily. 03/22/21  Yes Gerlene Fee, NP  FEROSUL 325 (65 Fe) MG tablet Take 1 tablet (325 mg total) by mouth 2 (two) times daily. 03/22/21  Yes Gerlene Fee, NP  fluticasone (FLONASE) 50 MCG/ACT nasal spray Place 1 spray into both nostrils daily. 03/22/21  Yes Gerlene Fee, NP  Ipratropium-Albuterol (COMBIVENT RESPIMAT) 20-100 MCG/ACT AERS respimat Inhale 2 puffs into the lungs 2 (two) times daily. 03/22/21  Yes Gerlene Fee, NP  lamoTRIgine (LAMICTAL) 100 MG tablet Take 1 tablet (100 mg total) by mouth daily. 03/22/21  Yes Gerlene Fee, NP  LEVEMIR FLEXTOUCH 100 UNIT/ML FlexTouch Pen Inject 30 Units into the skin at bedtime. 03/22/21  Yes Gerlene Fee, NP  lisinopril (ZESTRIL) 2.5 MG tablet Take 1 tablet (2.5 mg total) by mouth daily. 03/22/21  Yes Ok Edwards  S, NP  metFORMIN (GLUCOPHAGE-XR) 500 MG 24 hr tablet Take 2 tablets (1,000 mg total) by mouth daily with breakfast. Patient taking differently: Take 1,000 mg by mouth in the morning and at bedtime. 03/22/21  Yes Gerlene Fee, NP  NOVOLOG FLEXPEN 100 UNIT/ML FlexPen Inject 8 Units into the skin 3 (three) times daily. 03/22/21  Yes [provider]  omeprazole (PRILOSEC) 20 MG capsule Take 1 capsule (20 mg total) by mouth daily. 03/22/21  Yes Gerlene Fee, NP  Propylene Glycol (SYSTANE BALANCE) 0.6 % SOLN Apply 1 drop to eye 2 (two) times daily. Both eyes 03/22/21  Yes Gerlene Fee, NP   risperiDONE (RISPERDAL) 0.5 MG tablet Take 1 tablet (0.5 mg total) by mouth at bedtime. 03/22/21  Yes Gerlene Fee, NP  senna-docusate (SENOKOT-S) 8.6-50 MG tablet Take 1 tablet by mouth at bedtime as needed for mild constipation. 03/22/21  Yes Gerlene Fee, NP  tamsulosin (FLOMAX) 0.4 MG CAPS capsule Take 1 capsule (0.4 mg total) by mouth daily. 03/22/21  Yes Gerlene Fee, NP  TRADJENTA 5 MG TABS tablet Take 1 tablet (5 mg total) by mouth daily. 03/22/21  Yes Gerlene Fee, NP  traZODone (DESYREL) 100 MG tablet Take 1 tablet (100 mg total) by mouth at bedtime. 03/22/21  Yes Gerlene Fee, NP      Allergies    Patient has no known allergies.    Review of Systems   Review of Systems  Unable to perform ROS: Dementia  Constitutional:  Positive for fever.  Gastrointestinal:  Positive for abdominal pain.  All other systems reviewed and are negative.  Physical Exam Updated Vital Signs BP (!) 113/59 (BP Location: Right Arm)    Pulse 72    Temp 99.7 F (37.6 C) (Oral)    Resp 17    Ht 6' (1.829 m)    Wt 89.4 kg    SpO2 94%    BMI 26.73 kg/m  Physical Exam Vitals and nursing note reviewed.  Constitutional:      General: He is not in acute distress.    Appearance: Normal appearance. He is well-developed. He is not ill-appearing, toxic-appearing or diaphoretic.  HENT:     Head: Normocephalic and atraumatic.     Right Ear: External ear normal.     Left Ear: External ear normal.     Nose: Nose normal.     Mouth/Throat:     Mouth: Mucous membranes are moist.     Pharynx: Oropharynx is clear.  Eyes:     Extraocular Movements: Extraocular movements intact.     Conjunctiva/sclera: Conjunctivae normal.  Cardiovascular:     Rate and Rhythm: Regular rhythm. Tachycardia present.     Heart sounds: No murmur heard. Pulmonary:     Effort: Pulmonary effort is normal. No respiratory distress.     Breath sounds: Normal breath sounds. No wheezing or rales.  Abdominal:      Palpations: Abdomen is soft.     Tenderness: There is no abdominal tenderness.  Musculoskeletal:        General: No swelling or deformity.     Cervical back: Normal range of motion and neck supple.  Skin:    General: Skin is warm and dry.  Neurological:     General: No focal deficit present.     Mental Status: He is alert. Mental status is at baseline.     Cranial Nerves: No cranial nerve deficit.     Sensory: No sensory deficit.  Motor: No weakness.  Psychiatric:        Mood and Affect: Mood normal.        Behavior: Behavior normal.    ED Results / Procedures / Treatments   Labs (all labs ordered are listed, but only abnormal results are displayed) Labs Reviewed  URINE CULTURE - Abnormal; Notable for the following components:      Result Value   Culture 40,000 COLONIES/mL ESCHERICHIA COLI (*)    Organism ID, Bacteria ESCHERICHIA COLI (*)    All other components within normal limits  COMPREHENSIVE METABOLIC PANEL - Abnormal; Notable for the following components:   Glucose, Bld 197 (*)    BUN 25 (*)    Creatinine, Ser 1.38 (*)    AST 14 (*)    GFR, Estimated 54 (*)    All other components within normal limits  CBC WITH DIFFERENTIAL/PLATELET - Abnormal; Notable for the following components:   WBC 14.5 (*)    RBC 3.73 (*)    Hemoglobin 11.1 (*)    HCT 34.4 (*)    Neutro Abs 11.6 (*)    Eosinophils Absolute 0.6 (*)    Abs Immature Granulocytes 0.09 (*)    All other components within normal limits  URINALYSIS, ROUTINE W REFLEX MICROSCOPIC - Abnormal; Notable for the following components:   APPearance CLOUDY (*)    Hgb urine dipstick MODERATE (*)    Leukocytes,Ua LARGE (*)    RBC / HPF >50 (*)    WBC, UA >50 (*)    Bacteria, UA MANY (*)    All other components within normal limits  CBC - Abnormal; Notable for the following components:   WBC 14.0 (*)    RBC 3.52 (*)    Hemoglobin 10.7 (*)    HCT 33.5 (*)    All other components within normal limits  BASIC  METABOLIC PANEL - Abnormal; Notable for the following components:   Glucose, Bld 147 (*)    Creatinine, Ser 1.27 (*)    GFR, Estimated 60 (*)    All other components within normal limits  GLUCOSE, CAPILLARY - Abnormal; Notable for the following components:   Glucose-Capillary 173 (*)    All other components within normal limits  GLUCOSE, CAPILLARY - Abnormal; Notable for the following components:   Glucose-Capillary 123 (*)    All other components within normal limits  GLUCOSE, CAPILLARY - Abnormal; Notable for the following components:   Glucose-Capillary 168 (*)    All other components within normal limits  GLUCOSE, CAPILLARY - Abnormal; Notable for the following components:   Glucose-Capillary 188 (*)    All other components within normal limits  GLUCOSE, CAPILLARY - Abnormal; Notable for the following components:   Glucose-Capillary 199 (*)    All other components within normal limits  GLUCOSE, CAPILLARY - Abnormal; Notable for the following components:   Glucose-Capillary 123 (*)    All other components within normal limits  GLUCOSE, CAPILLARY - Abnormal; Notable for the following components:   Glucose-Capillary 206 (*)    All other components within normal limits  CULTURE, BLOOD (ROUTINE X 2)  CULTURE, BLOOD (ROUTINE X 2)  RESP PANEL BY RT-PCR (FLU A&B, COVID) ARPGX2  LACTIC ACID, PLASMA  PROTIME-INR  LIPASE, BLOOD    EKG None  Radiology No results found.  Procedures BLADDER CATHETERIZATION  Date/Time: 05/09/2021 1:30 PM Performed by: Godfrey Pick, MD Authorized by: Godfrey Pick, MD   Consent:    Consent obtained:  Verbal   Consent given by:  Patient   Risks, benefits, and alternatives were discussed: yes   Pre-procedure details:    Procedure purpose: Diagnostic and therapeutic.   Preparation: Patient was prepped and draped in usual sterile fashion   Anesthesia:    Anesthesia method:  None Procedure details:    Provider performed due to:  Nurse unable to  complete   Catheter insertion:  Non-indwelling   Catheter type:  Foley   Bladder irrigation: no     Number of attempts:  1   Urine characteristics:  Yellow Post-procedure details:    Procedure completion:  Tolerated well, no immediate complications    Medications Ordered in ED Medications  aspirin EC tablet 81 mg (81 mg Oral Given 05/11/21 0804)  atorvastatin (LIPITOR) tablet 40 mg (40 mg Oral Given 05/11/21 0804)  lisinopril (ZESTRIL) tablet 2.5 mg (2.5 mg Oral Given 05/11/21 0805)  citalopram (CELEXA) tablet 20 mg (20 mg Oral Given 05/10/21 2051)  risperiDONE (RISPERDAL) tablet 0.5 mg (0.5 mg Oral Given 05/10/21 2051)  traZODone (DESYREL) tablet 100 mg (100 mg Oral Given 05/10/21 2051)  pantoprazole (PROTONIX) EC tablet 40 mg (40 mg Oral Given 05/11/21 0804)  tamsulosin (FLOMAX) capsule 0.4 mg (0.4 mg Oral Given 05/11/21 0804)  lamoTRIgine (LAMICTAL) tablet 100 mg (100 mg Oral Given 05/11/21 0804)  insulin glargine-yfgn (SEMGLEE) injection 30 Units (30 Units Subcutaneous Given 05/10/21 2227)  insulin aspart (novoLOG) injection 0-9 Units (1 Units Subcutaneous Given 05/11/21 0808)  heparin injection 5,000 Units (5,000 Units Subcutaneous Given 05/11/21 0533)  sodium chloride flush (NS) 0.9 % injection 3 mL (3 mLs Intravenous Given 05/11/21 0808)  sodium chloride flush (NS) 0.9 % injection 3 mL (has no administration in time range)  0.9 %  sodium chloride infusion (250 mLs Intravenous New Bag/Given 05/09/21 2256)  acetaminophen (TYLENOL) tablet 650 mg (has no administration in time range)    Or  acetaminophen (TYLENOL) suppository 650 mg (has no administration in time range)  polyethylene glycol (MIRALAX / GLYCOLAX) packet 17 g (has no administration in time range)  ondansetron (ZOFRAN) tablet 4 mg (has no administration in time range)    Or  ondansetron (ZOFRAN) injection 4 mg (has no administration in time range)  cefTRIAXone (ROCEPHIN) 1 g in sodium chloride 0.9 % 100 mL IVPB (0 g Intravenous  Stopped 05/10/21 2304)  lactated ringers bolus 500 mL (0 mLs Intravenous Stopped 05/09/21 1110)  acetaminophen (TYLENOL) tablet 650 mg (650 mg Oral Given 05/09/21 1110)  lactated ringers bolus 2,000 mL (0 mLs Intravenous Stopped 05/09/21 1446)  ceFEPIme (MAXIPIME) 2 g in sodium chloride 0.9 % 100 mL IVPB (0 g Intravenous Stopped 05/09/21 1322)  metroNIDAZOLE (FLAGYL) IVPB 500 mg (0 mg Intravenous Stopped 05/09/21 1446)  vancomycin (VANCOREADY) IVPB 1750 mg/350 mL (0 mg Intravenous Stopped 05/09/21 1500)    ED Course/ Medical Decision Making/ A&P                           Medical Decision Making Amount and/or Complexity of Data Reviewed Labs: ordered. Radiology: ordered. ECG/medicine tests: ordered.  Risk OTC drugs. Prescription drug management. Decision regarding hospitalization.   This patient presents to the ED for concern of fever, this involves an extensive number of treatment options, and is a complaint that carries with it a high risk of complications and morbidity.  The differential diagnosis includes acute bacterial infection, acute viral infection, PE, NMS, serotonin syndrome   Co morbidities that complicate the patient evaluation  CKD, COPD, T2DM, GERD,  HLD, depression, HTN   Additional history obtained:  Additional history obtained from EMS External records from outside source obtained and reviewed including EMR   Lab Tests:  I Ordered, and personally interpreted labs.  The pertinent results include: Leukocytosis, baseline CKD, baseline normocytic anemia, UTI   Imaging Studies ordered:  I ordered imaging studies including chest x-ray I independently visualized and interpreted imaging which showed no acute findings I agree with the radiologist interpretation   Cardiac Monitoring:  The patient was maintained on a cardiac monitor.  I personally viewed and interpreted the cardiac monitored which showed an underlying rhythm of: Sinus rhythm   Medicines ordered  and prescription drug management:  I ordered medication including IVF and broad-spectrum antibiotics for acute infection; Tylenol for antipyresis Reevaluation of the patient after these medicines showed that the patient improved I have reviewed the patients home medicines and have made adjustments as needed  Problem List / ED Course:  Patient is a 76 year old male who presents from a long-term care facility for onset of fever today.  He does reportedly have dementia at baseline.  On his arrival in the ED, he is alert and oriented x2.  History is less limited.  He initially denied any complaints.  Subsequently, he endorsed some lower abdominal pain that is mild in nature.  He does not have associated tenderness.  Initial vital signs were notable for mild tachycardia.  He was normotensive and did not have fever upon arrival.  On subsequent vital sign check, he did have fever present.  Work-up was initiated to identify source of fever.  He remained hemodynamically stable during this work-up.  Tylenol was given for antipyresis.  Initial labs showed leukocytosis.  Given fever, tachycardia, and leukocytosis, patient was given 30 cc/kg of IV fluids and treated with broad-spectrum antibiotics.  There was a delay in obtaining a urine sample.  Patient arrived with a condom catheter on.  He was encouraged to urinate but was unable to do so.  On bedside ultrasound, patient had approximately 500 cc of urine in bladder, consistent with urinary retention.  Nursing attempted in and out catheter twice unsuccessfully.  Second attempt caused a small amount of urethral trauma.  It appears that these first attempts were unsuccessful because a male catheter was being used.  I performed successful in and out catheterization myself.  Urine showed acute infection.  Patient was admitted for further management.   Reevaluation:  After the interventions noted above, I reevaluated the patient and found that they have  :improved   Social Determinants of Health:  Dementia, resides in nursing facility   Dispostion:  After consideration of the diagnostic results and the patients response to treatment, I feel that the patent would benefit from admission.    CRITICAL CARE Performed by: Godfrey Pick   Total critical care time: 32 minutes  Critical care time was exclusive of separately billable procedures and treating other patients.  Critical care was necessary to treat or prevent imminent or life-threatening deterioration.  Critical care was time spent personally by me on the following activities: development of treatment plan with patient and/or surrogate as well as nursing, discussions with consultants, evaluation of patient's response to treatment, examination of patient, obtaining history from patient or surrogate, ordering and performing treatments and interventions, ordering and review of laboratory studies, ordering and review of radiographic studies, pulse oximetry and re-evaluation of patient's condition.         Final Clinical Impression(s) / ED Diagnoses Final diagnoses:  Sepsis,  due to unspecified organism, unspecified whether acute organ dysfunction present [facility])  Acute pyelonephritis    Rx / DC Orders ED Discharge Orders          Ordered    cefdinir (OMNICEF) 300 MG capsule  2 times daily        05/11/21 1215    Diet - low sodium heart healthy        05/11/21 1215    Discharge instructions       Comments: Take medications as prescribed Maintain adequate hydration Follow heart healthy/modified carbohydrate diet Arrange follow-up with PCP in 10 days.   05/11/21 1215    Diet Carb Modified        05/11/21 1215              Godfrey Pick, MD 05/11/21 1218

## 2021-05-09 NOTE — Assessment & Plan Note (Addendum)
-  Stable overall. -Continue home antihypertensive agents.   -Heart healthy diet encouraged.

## 2021-05-09 NOTE — Progress Notes (Signed)
[facility] is following Sepsis bundle   

## 2021-05-09 NOTE — ED Triage Notes (Signed)
Patient via EMS from [facility] for fever.

## 2021-05-09 NOTE — Assessment & Plan Note (Addendum)
-  Sepsis criteria met at time of admission (Patient with fever, dysuria/abnormal urinalysis for source of infection; elevated WBCs and elevated heart rate).  -Please follow-up final culture results and sensitivity; excellent response to the use of ceftriaxone empirically. -Isolated microorganism E. Coli. -Lactic acid 1.2  -Discharge on oral cefdinir twice a day to complete antibiotic therapy; patient advised to keep himself well-hydrated. -Sepsis features resolved at discharge.

## 2021-05-09 NOTE — Progress Notes (Signed)
Pharmacy Antibiotic Note  Douglas Cook is a 76 y.o. male admitted on 05/09/2021 with fevers of 102, nursing home resident. Pharmacy has been consulted for vancomycin and cefepime dosing.  Plan: Vancomycin LD 1750mg  x1, 1500mg  q 24 hrs (Kinetics: Vd 0.72, Ke 0.048, AUC target 519 using IBW) 2. Cefepime 2gm IV q 12 hours 3. Monitor renal function q 72 hours, f/u Blood cultures  Height: 6' (182.9 cm) Weight: 83.9 kg (185 lb) IBW/kg (Calculated) : 77.6  Temp (24hrs), Avg:100.3 F (37.9 C), Min:99.2 F (37.3 C), Max:101.3 F (38.5 C)  Recent Labs  Lab 05/09/21 0959  WBC 14.5*  CREATININE 1.38*  LATICACIDVEN 1.2    Estimated Creatinine Clearance: 52.3 mL/min (A) (by C-G formula based on SCr of 1.38 mg/dL (H)).    No Known Allergies  Antimicrobials this admission: Vanc 214 >>  Cefepime 2/14>>  Flagyl 2/14 x1  Dose adjustments this admission:   Microbiology results: 2/14 BCx: P 2/14 UCx: P  Thank you for allowing pharmacy to be a part of this patients care.  3/14 [facility] 05/09/2021 12:43 PM

## 2021-05-09 NOTE — Assessment & Plan Note (Addendum)
-  Resume home hypoglycemic regimen -Continue close follow-up outpatient CBGs with further adjustment to his therapy as needed. -Modified carbohydrate diet encouraged.

## 2021-05-10 DIAGNOSIS — I10 Essential (primary) hypertension: Secondary | ICD-10-CM

## 2021-05-10 DIAGNOSIS — R29818 Other symptoms and signs involving the nervous system: Secondary | ICD-10-CM

## 2021-05-10 DIAGNOSIS — N1832 Chronic kidney disease, stage 3b: Secondary | ICD-10-CM

## 2021-05-10 DIAGNOSIS — E1122 Type 2 diabetes mellitus with diabetic chronic kidney disease: Secondary | ICD-10-CM

## 2021-05-10 DIAGNOSIS — E785 Hyperlipidemia, unspecified: Secondary | ICD-10-CM

## 2021-05-10 DIAGNOSIS — N1831 Chronic kidney disease, stage 3a: Secondary | ICD-10-CM

## 2021-05-10 DIAGNOSIS — K219 Gastro-esophageal reflux disease without esophagitis: Secondary | ICD-10-CM

## 2021-05-10 DIAGNOSIS — Z794 Long term (current) use of insulin: Secondary | ICD-10-CM

## 2021-05-10 DIAGNOSIS — R4189 Other symptoms and signs involving cognitive functions and awareness: Secondary | ICD-10-CM

## 2021-05-10 LAB — CBC
HCT: 33.5 % — ABNORMAL LOW (ref 39.0–52.0)
Hemoglobin: 10.7 g/dL — ABNORMAL LOW (ref 13.0–17.0)
MCH: 30.4 pg (ref 26.0–34.0)
MCHC: 31.9 g/dL (ref 30.0–36.0)
MCV: 95.2 fL (ref 80.0–100.0)
Platelets: 208 10*3/uL (ref 150–400)
RBC: 3.52 MIL/uL — ABNORMAL LOW (ref 4.22–5.81)
RDW: 14.1 % (ref 11.5–15.5)
WBC: 14 10*3/uL — ABNORMAL HIGH (ref 4.0–10.5)
nRBC: 0 % (ref 0.0–0.2)

## 2021-05-10 LAB — BASIC METABOLIC PANEL
Anion gap: 14 (ref 5–15)
BUN: 21 mg/dL (ref 8–23)
CO2: 23 mmol/L (ref 22–32)
Calcium: 9.3 mg/dL (ref 8.9–10.3)
Chloride: 104 mmol/L (ref 98–111)
Creatinine, Ser: 1.27 mg/dL — ABNORMAL HIGH (ref 0.61–1.24)
GFR, Estimated: 60 mL/min — ABNORMAL LOW (ref >60)
Glucose, Bld: 147 mg/dL — ABNORMAL HIGH (ref 70–99)
Potassium: 4.4 mmol/L (ref 3.5–5.1)
Sodium: 141 mmol/L (ref 135–145)

## 2021-05-10 LAB — GLUCOSE, CAPILLARY
Glucose-Capillary: 123 mg/dL — ABNORMAL HIGH (ref 70–99)
Glucose-Capillary: 168 mg/dL — ABNORMAL HIGH (ref 70–99)
Glucose-Capillary: 188 mg/dL — ABNORMAL HIGH (ref 70–99)
Glucose-Capillary: 199 mg/dL — ABNORMAL HIGH (ref 70–99)

## 2021-05-10 NOTE — Assessment & Plan Note (Addendum)
-  Continue PPI. -No nausea, no vomiting, no epigastric pain.

## 2021-05-10 NOTE — TOC Initial Note (Signed)
Transition of Care [facility]) - Initial/Assessment Note    Patient Details  Name: Douglas Cook MRN: [id number] Date of Birth: [birth year]  Transition of Care [facility]) CM/SW Contact:    Salome Arnt, LCSW Phone Number: 05/10/2021, 11:15 AM  Clinical Narrative: Pt admitted with sepsis secondary to UTI. LCSW completed assessment with pt's sister, Douglas Cook as pt oriented to self and place only per chart. Douglas Cook lives in [place]. She reports pt has a son, but they are estranged. Pt has been a resident at [facility] for about 9 years. Douglas Cook requests return when medically stable. Per Sunday Spillers at [facility], pt requires assist with all ADLs. He recently returned to them from SNF after a hip fracture and has primarily used wheelchair since then. Sunday Spillers is unsure if pt is currently active with home health, but will update TOC. Okay to return.                   Expected Discharge Plan: Assisted Living Barriers to Discharge: Continued Medical Work up   Patient Goals and CMS Choice Patient states their goals for this hospitalization and ongoing recovery are:: return to ALF   Choice offered to / list presented to : Sibling  Expected Discharge Plan and Services Expected Discharge Plan: Assisted Living In-house Referral: Clinical Social Work   Post Acute Care Choice: (P) Resumption of Svcs/PTA Provider Living arrangements for the past 2 months: [place]                                      Prior Living Arrangements/Services Living arrangements for the past 2 months: [place] Lives with:: Facility Resident Patient language and need for interpreter reviewed:: Yes Do you feel safe going back to the place where you live?: Yes      Need for Family Participation in Patient Care: Yes (Comment) Care giver support system in place?: Yes (comment) Current home services: (P) DME Criminal Activity/Legal Involvement Pertinent to Current  Situation/Hospitalization: No - Comment as needed  Activities of Daily Living Home Assistive Devices/Equipment: Environmental consultant (specify type) ADL Screening (condition at time of admission) Patient's cognitive ability adequate to safely complete daily activities?: No Is the patient deaf or have difficulty hearing?: No Does the patient have difficulty seeing, even when wearing glasses/contacts?: No Does the patient have difficulty concentrating, remembering, or making decisions?: Yes Patient able to express need for assistance with ADLs?: Yes Does the patient have difficulty dressing or bathing?: Yes Independently performs ADLs?: No Does the patient have difficulty walking or climbing stairs?: Yes Weakness of Legs: Both Weakness of Arms/Hands: Both  Permission Sought/Granted   Permission granted to share information with : Yes, Verbal Permission Granted  Share Information with NAME: [place]     Permission granted to share info w Relationship: ALF     Emotional Assessment   Attitude/Demeanor/Rapport: Unable to Assess Affect (typically observed): Unable to Assess Orientation: : Oriented to Self, Oriented to Place Alcohol / Substance Use: Not Applicable Psych Involvement: No (comment)  Admission diagnosis:  Sepsis secondary to UTI ([place]) [A41.9, N39.0] Patient Active Problem List   Diagnosis Date Noted   Sepsis secondary to UTI ([place]) 05/09/2021   Neurocognitive deficits 02/10/2021   HTN (hypertension) 02/08/2021   BPH without obstruction/lower urinary tract symptoms 02/08/2021   Anemia due to stage 3b chronic kidney disease ([place]) 02/08/2021   CKD stage 3 due to type 2 diabetes mellitus (  [place]) 02/08/2021   Chronic constipation 02/08/2021   Major depression, recurrent, chronic ([place]) 02/08/2021   Closed fracture of neck of left femur ([place])    Hip fracture ([place]) 02/01/2021   AKI (acute kidney injury) ([place]) 07/04/2017   Chronic kidney disease, stage 3a ([place]) 07/04/2017   COPD (chronic  obstructive pulmonary disease) ([place]) 07/04/2017   Type 2 diabetes mellitus ([place]) 07/04/2017   GERD (gastroesophageal reflux disease) 07/04/2017   HLD (hyperlipidemia) 07/04/2017   Depression 07/04/2017   PCP:  Glenda Chroman, MD Pharmacy:   [place] [place] [facility] [address] [place] [address] Phone: [phone] Fax: [phone]  [place] [place] [address] [place] [address] Phone: [phone] Fax: [phone]     Social Determinants of Health (SDOH) Interventions    Readmission Risk Interventions Readmission Risk Prevention Plan 05/10/2021  Transportation Screening Complete  HRI or Home Care Consult Complete  Social Work Consult for [place] Planning/Counseling Complete  Palliative Care Screening Not Applicable  Medication Review Press photographer) Complete  Some recent data might be hidden

## 2021-05-10 NOTE — Progress Notes (Signed)
Progress Note   Patient: Douglas Cook [id number] DOB: 1946/01/27 DOA: 05/09/2021     1 DOS: the patient was seen and examined on 05/10/2021  Hospital admission narrative As per H&P written by Dr.Choi on 05/09/2021 Douglas Cook is a 76 y.o. male with medical history significant of CKD stage III, diabetes, hyperlipidemia, hypertension who presented from skilled nursing facility secondary to fever.  He has some baseline memory loss and is a poor historian overall.  Admits to having some left lower abdominal pain, dysuria and fever.  Also admits to nausea, vomiting, diarrhea.  Assessment and plan * Sepsis secondary to UTI [facility])- (present on admission) -Sepsis criteria met at time of admission.  -Final urine culture, blood cultures pending -Lactic acid 1.2  -Continue empirical use of Rocephin -Continue to maintain adequate hydration.  Neurocognitive deficits- (present on admission) -Resides at ALF -Sister [name] is his main point of contact/blood relative who lives in [place]. -Has a son and ex-wife who are not in contact.  -Continue supportive care and return back to assisted living facility when medically stable.  HTN (hypertension)- (present on admission) -Stable labetalol -Continue home antihypertensive agents.   -Heart healthy diet encouraged.    HLD (hyperlipidemia)- (present on admission) -Continue the use of Lipitor  -Heart healthy diet advised.  GERD (gastroesophageal reflux disease)- (present on admission) - Continue PPI.  Type 2 diabetes mellitus (H[facility]) -Continue the use of Semglee, SSI  -Modified carbohydrate diet encouraged.  Chronic kidney disease, stage 3a ([place])- (present on admission) -Baseline Cr 1.4 -Stable/further improve with fluid resuscitation -Current creatinine level 1.27 -Continue to avoid nephrotoxic agents and maintain adequate hydration.    Subjective:  No nausea, no vomiting, no fever; patient denies chest pain or shortness of breath.   Good appetite and tolerating diet without problems.  Currently denies dysuria.  Physical Exam: Vitals:   05/09/21 1830 05/09/21 2000 05/10/21 0543 05/10/21 1240  BP: 129/68 130/70 (!) 128/58 115/62  Pulse: (!) 103 97 79 82  Resp: $Remo'19 20 19 18  'aIfTY$ Temp: 99.1 F (37.3 C) 99.3 F (37.4 C) 98 F (36.7 C) 98.5 F (36.9 C)  TempSrc:  Oral  Oral  SpO2: 95% 99% 98% 97%  Weight:  89.4 kg    Height:  6' (1.829 m)     General exam: Alert, awake, following commands appropriately reporting good appetite.  No fever, no nausea or vomiting.  Patient denies dysuria. Respiratory system: Clear to auscultation. Respiratory effort normal.  Good saturation on room air.  No using accessory muscles. Cardiovascular system: Rate controlled, no rubs, no gallops, no JVD. Gastrointestinal system: Abdomen is nondistended, soft and nontender. No organomegaly or masses felt. Normal bowel sounds heard. Central nervous system: No focal neurological deficits. Extremities: No cyanosis, clubbing or edema. Skin: No petechiae. Psychiatry: Mood & affect appropriate.    Data Reviewed: Microscopic results demonstrating more than 40,000 E. coli Microorganism; sensitivity pending.  CBC: Stable hemoglobin at 10.7; WBCs 74,259  Basic metabolic panel with improvement in his creatinine, now 1.27; BUN 21 normal electrolytes.   Family Communication: no family at bedside.  Disposition: Status is: Inpatient Remains inpatient appropriate because: Pending final urine culture results and continue need of IV antibiotics.    Planned Discharge Destination: Assisted living facility at discharge.   Author: Barton Dubois, MD 05/10/2021 7:05 PM  For on call review [email or web address].

## 2021-05-11 LAB — URINE CULTURE: Culture: 40000 — AB

## 2021-05-11 LAB — GLUCOSE, CAPILLARY
Glucose-Capillary: 123 mg/dL — ABNORMAL HIGH (ref 70–99)
Glucose-Capillary: 206 mg/dL — ABNORMAL HIGH (ref 70–99)

## 2021-05-11 MED ORDER — CEFDINIR 300 MG PO CAPS
300.0000 mg | ORAL_CAPSULE | Freq: Two times a day (BID) | ORAL | 0 refills | Status: AC
Start: 2021-05-11 — End: 2021-05-17

## 2021-05-11 NOTE — Discharge Summary (Signed)
Physician Discharge Summary   Patient: Douglas Cook MRN: [id number] DOB: 09-Jul-1945  Admit date:     05/09/2021  Discharge date: 05/11/21  Discharge Physician: Barton Dubois   PCP: Glenda Chroman, MD   Recommendations at discharge:  Repeat basic metabolic panel to follow electrolytes and renal function. Reassess blood pressure and adjust antihypertensive regimen as needed. Continue to follow CBG/A1c with further adjustment to hypoglycemia regimen as required.  Discharge Diagnoses: Principal Problem:   Sepsis secondary to UTI [facility]) Active Problems:   Chronic kidney disease, stage 3a ([place])   Type 2 diabetes mellitus ([place])   GERD (gastroesophageal reflux disease)   HLD (hyperlipidemia)   HTN (hypertension)   Neurocognitive deficits  Hospital admission narrative As per H&P written by Dr.Choi on 05/09/2021 Douglas Cook is a 75 y.o. male with medical history significant of CKD stage III, diabetes, hyperlipidemia, hypertension who presented from skilled nursing facility secondary to fever.  He has some baseline memory loss and is a poor historian overall.  Admits to having some left lower abdominal pain, dysuria and fever.  Also admits to nausea, vomiting, diarrhea.  Assessment and Plan: * Sepsis secondary to UTI [facility])- (present on admission) -Sepsis criteria met at time of admission (Patient with fever, dysuria/abnormal urinalysis for source of infection; elevated WBCs and elevated heart rate).  -Please follow-up final culture results and sensitivity; excellent response to the use of ceftriaxone empirically. -Isolated microorganism E. Coli. -Lactic acid 1.2  -Discharge on oral cefdinir twice a day to complete antibiotic therapy; patient advised to keep himself well-hydrated. -Sepsis features resolved at discharge.  Neurocognitive deficits- (present on admission) -Resides at ALF -Sister [name] is his main point of contact/blood relative who lives in [place]. -Has a son and  ex-wife who are not in contact.  -Continue supportive care and return back to assisted living facility when medically stable.  HTN (hypertension)- (present on admission) -Stable overall. -Continue home antihypertensive agents.   -Heart healthy diet encouraged.    Depression- (present on admission) -Patient with history of depression/anxiety -Overall mood stable -Resume home psychotropic medications -Continue supportive care and outpatient follow-up.  HLD (hyperlipidemia)- (present on admission) -Continue the use of Lipitor  -Heart healthy diet advised. -Continue to follow LFTs and lipid panel periodically, with further adjustment to therapy as required.  GERD (gastroesophageal reflux disease)- (present on admission) -Continue PPI. -No nausea, no vomiting, no epigastric pain.  Type 2 diabetes mellitus ([place]) -Resume home hypoglycemic regimen -Continue close follow-up outpatient CBGs with further adjustment to his therapy as needed. -Modified carbohydrate diet encouraged.  Chronic kidney disease, stage 3a ([place])- (present on admission) -Baseline Cr 1.4 -Stable/further improved with fluid resuscitation -Current creatinine level 1.27 -Continue to avoid nephrotoxic agents and maintain adequate hydration. -Patient with possible mild component acute kidney injury in the setting of dehydration and acute UTI.   Consultants: None Procedures performed: See below for x-ray reports. Disposition: Discharged back to assisted living facility.  Diet recommendation:  Discharge Diet Orders (From admission, onward)     Start     Ordered   05/11/21 0000  Diet - low sodium heart healthy        05/11/21 1215   05/11/21 0000  Diet Carb Modified        05/11/21 1215           DISCHARGE MEDICATION: Allergies as of 05/11/2021   No Known Allergies      Medication List     TAKE these medications    acetaminophen  325 MG tablet Commonly known as: TYLENOL Take 2 tablets (650 mg total)  by mouth every 6 (six) hours as needed for mild pain (or Fever >/= 101).   albuterol 108 (90 Base) MCG/ACT inhaler Commonly known as: VENTOLIN HFA Inhale 1-2 puffs into the lungs every 4 (four) hours as needed for wheezing or shortness of breath.   alendronate 70 MG tablet Commonly known as: FOSAMAX Take 70 mg by mouth once a week.   Alpha-Lipoic Acid 300 MG Caps Take 1 capsule (300 mg total) by mouth daily at 12 noon. At 10 pm   aspirin EC 81 MG tablet Take 1 tablet (81 mg total) by mouth daily. Swallow whole.   atorvastatin 20 MG tablet Commonly known as: LIPITOR Take 1 tablet (20 mg total) by mouth daily. What changed: how much to take   cefdinir 300 MG capsule Commonly known as: OMNICEF Take 1 capsule (300 mg total) by mouth 2 (two) times daily for 6 days.   cetirizine 10 MG tablet Commonly known as: ZYRTEC Take 1 tablet (10 mg total) by mouth at bedtime.   cholecalciferol 25 MCG (1000 UNIT) tablet Commonly known as: VITAMIN D Take 1 tablet (1,000 Units total) by mouth daily.   citalopram 20 MG tablet Commonly known as: CELEXA Take 1 tablet (20 mg total) by mouth at bedtime.   Combivent Respimat 20-100 MCG/ACT Aers respimat Generic drug: Ipratropium-Albuterol Inhale 2 puffs into the lungs 2 (two) times daily.   feeding supplement (GLUCERNA SHAKE) Liqd Take 237 mLs by mouth daily.   FeroSul 325 (65 FE) MG tablet Generic drug: ferrous sulfate Take 1 tablet (325 mg total) by mouth 2 (two) times daily.   fluticasone 50 MCG/ACT nasal spray Commonly known as: FLONASE Place 1 spray into both nostrils daily.   lamoTRIgine 100 MG tablet Commonly known as: LAMICTAL Take 1 tablet (100 mg total) by mouth daily.   Levemir FlexTouch 100 UNIT/ML FlexPen Generic drug: insulin detemir Inject 30 Units into the skin at bedtime.   lisinopril 2.5 MG tablet Commonly known as: ZESTRIL Take 1 tablet (2.5 mg total) by mouth daily.   metFORMIN 500 MG 24 hr tablet Commonly  known as: GLUCOPHAGE-XR Take 2 tablets (1,000 mg total) by mouth daily with breakfast. What changed: when to take this   NovoLOG FlexPen 100 UNIT/ML FlexPen Generic drug: insulin aspart Inject 8 Units into the skin 3 (three) times daily.   omeprazole 20 MG capsule Commonly known as: PRILOSEC Take 1 capsule (20 mg total) by mouth daily.   risperiDONE 0.5 MG tablet Commonly known as: RISPERDAL Take 1 tablet (0.5 mg total) by mouth at bedtime.   senna-docusate 8.6-50 MG tablet Commonly known as: Senokot-S Take 1 tablet by mouth at bedtime as needed for mild constipation.   Systane Balance 0.6 % Soln Generic drug: Propylene Glycol Apply 1 drop to eye 2 (two) times daily. Both eyes   tamsulosin 0.4 MG Caps capsule Commonly known as: FLOMAX Take 1 capsule (0.4 mg total) by mouth daily.   Tradjenta 5 MG Tabs tablet Generic drug: linagliptin Take 1 tablet (5 mg total) by mouth daily.   traZODone 100 MG tablet Commonly known as: DESYREL Take 1 tablet (100 mg total) by mouth at bedtime.        Follow-up Information     Vyas, Dhruv B, MD. Schedule an appointment as soon as possible for a visit in 10 day(s).   Specialty: Internal Medicine Contact information: [address] [place] [address] [phone]                 Discharge Exam: Filed Weights   05/09/21 0931 05/09/21 2000  Weight: 83.9 kg 89.4 kg   General exam: Alert, awake, following commands appropriately and in no acute distress.  Patient is afebrile, no nausea, no vomiting, no dysuria. Respiratory system: Clear to auscultation. Respiratory effort normal.  No using accessory muscles.  Good saturation on room air. Cardiovascular system:RRR. No murmurs, rubs, gallops or JVD. Gastrointestinal system: Abdomen is nondistended, soft and nontender. No organomegaly or masses felt. Normal bowel sounds heard. Central nervous system: No focal neurological deficits. Extremities: No cyanosis or clubbing. Skin: No  petechiae. Psychiatry: Mood & affect appropriate.    Condition at discharge: stable  The results of significant diagnostics from this hospitalization (including imaging, microbiology, ancillary and laboratory) are listed below for reference.   Imaging Studies: CT Head Wo Contrast  Result Date: 04/18/2021 CLINICAL DATA:  Altered mental status, weakness and somnolence. EXAM: CT HEAD WITHOUT CONTRAST TECHNIQUE: Contiguous axial images were obtained from the base of the skull through the vertex without intravenous contrast. RADIATION DOSE REDUCTION: This exam was performed according to the departmental dose-optimization program which includes automated exposure control, adjustment of the mA and/or kV according to patient size and/or use of iterative reconstruction technique. COMPARISON:  02/01/2021 FINDINGS: Brain: Chronic ventriculomegaly noted including temporal horn dilatation, much of this may be ex vacuo, strictly speaking normal pressure hydrocephalus is not readily excluded. Periventricular white matter hypodensity is stable and likely primarily from chronic ischemic microvascular white matter disease. Otherwise, the brainstem, cerebellum, cerebral peduncles, thalamus, basal ganglia, basilar cisterns, and ventricular system appear within normal limits. No intracranial hemorrhage, mass lesion, or acute CVA. Vascular: Unremarkable Skull: Unremarkable Sinuses/Orbits: Unremarkable Other: No supplemental non-categorized findings. IMPRESSION: 1. No acute findings and no change from prior exams. 2. Ventriculomegaly may be ex vacuo, but strictly speaking normal pressure hydrocephalus is not completely excluded. 3. Periventricular white matter and corona radiata hypodensities favor chronic ischemic microvascular white matter disease. Electronically Signed   By: Van Clines M.D.   On: 04/18/2021 15:27   DG Chest Port 1 View  Result Date: 05/09/2021 CLINICAL DATA:  A 76 year old male presents for  questionable sepsis. EXAM: PORTABLE CHEST 1 VIEW COMPARISON:  April 18, 2021. FINDINGS: EKG leads project over the chest. Cardiomediastinal contours and hilar structures are stable. No effusion or consolidation on frontal radiograph. No visible pneumothorax. On limited assessment there is no acute skeletal process. IMPRESSION: 1. No acute cardiopulmonary process. 2. Stable appearance of the chest. Electronically Signed   By: Zetta Bills M.D.   On: 05/09/2021 10:19   DG Chest Portable 1 View  Result Date: 04/18/2021 CLINICAL DATA:  Altered mental status. Chest x-ray dated February 04, 2021 EXAM: PORTABLE CHEST 1 VIEW COMPARISON:  Chest x-ray dated February 04, 2021 FINDINGS: The heart size and mediastinal contours are within normal limits. No acute parenchymal opacity. The visualized skeletal structures are unremarkable. IMPRESSION: No active disease. Electronically Signed   By: Yetta Glassman M.D.   On: 04/18/2021 14:12    Microbiology: Results for orders placed or performed during the hospital encounter of 05/09/21  Urine Culture     Status: Abnormal   Collection Time: 05/09/21  9:43 AM   Specimen: Urine, Catheterized  Result Value Ref Range Status   Specimen Description   Final    URINE, CATHETERIZED Performed at [facility], [address] [place], [place] [address]  Special Requests   Final    NONE Performed at [facility], [address], [place], [place] [address]    Culture 40,000 COLONIES/mL ESCHERICHIA COLI (A)  Final   Report Status 05/11/2021 FINAL  Final   Organism ID, Bacteria ESCHERICHIA COLI (A)  Final      Susceptibility   Escherichia coli - MIC*    AMPICILLIN 16 INTERMEDIATE Intermediate     CEFAZOLIN <=4 SENSITIVE Sensitive     CEFEPIME <=0.12 SENSITIVE Sensitive     CEFTRIAXONE <=0.25 SENSITIVE Sensitive     CIPROFLOXACIN >=4 RESISTANT Resistant     GENTAMICIN <=1 SENSITIVE Sensitive     IMIPENEM <=0.25 SENSITIVE Sensitive     NITROFURANTOIN <=16 SENSITIVE  Sensitive     TRIMETH/SULFA <=20 SENSITIVE Sensitive     AMPICILLIN/SULBACTAM 4 SENSITIVE Sensitive     PIP/TAZO <=4 SENSITIVE Sensitive     * 40,000 COLONIES/mL ESCHERICHIA COLI  Resp Panel by RT-PCR (Flu A&B, Covid) Nasopharyngeal Swab     Status: None   Collection Time: 05/09/21  9:43 AM   Specimen: Nasopharyngeal Swab; Nasopharyngeal(NP) swabs in vial transport medium  Result Value Ref Range Status   SARS Coronavirus 2 by RT PCR NEGATIVE NEGATIVE Final    Comment: (NOTE) SARS-CoV-2 target nucleic acids are NOT DETECTED.  The SARS-CoV-2 RNA is generally detectable in upper respiratory specimens during the acute phase of infection. The lowest concentration of SARS-CoV-2 viral copies this assay can detect is 138 copies/mL. A negative result does not preclude SARS-Cov-2 infection and should not be used as the sole basis for treatment or other patient management decisions. A negative result may occur with  improper specimen collection/handling, submission of specimen other than nasopharyngeal swab, presence of viral mutation(s) within the areas targeted by this assay, and inadequate number of viral copies(<138 copies/mL). A negative result must be combined with clinical observations, patient history, and epidemiological information. The expected result is Negative.  Fact Sheet for Patients:  EntrepreneurPulse.com.au  Fact Sheet for Healthcare Providers:  IncredibleEmployment.be  This test is no t yet approved or cleared by the [place] FDA and  has been authorized for detection and/or diagnosis of SARS-CoV-2 by FDA under an Emergency Use Authorization (EUA). This EUA will remain  in effect (meaning this test can be used) for the duration of the COVID-19 declaration under Section 564(b)(1) of the Act, 21 U.S.C.section 360bbb-3(b)(1), unless the authorization is terminated  or revoked sooner.       Influenza A by PCR NEGATIVE NEGATIVE  Final   Influenza B by PCR NEGATIVE NEGATIVE Final    Comment: (NOTE) The Xpert Xpress SARS-CoV-2/FLU/RSV plus assay is intended as an aid in the diagnosis of influenza from Nasopharyngeal swab specimens and should not be used as a sole basis for treatment. Nasal washings and aspirates are unacceptable for Xpert Xpress SARS-CoV-2/FLU/RSV testing.  Fact Sheet for Patients: EntrepreneurPulse.com.au  Fact Sheet for Healthcare Providers: IncredibleEmployment.be  This test is not yet approved or cleared by the [place] FDA and has been authorized for detection and/or diagnosis of SARS-CoV-2 by FDA under an Emergency Use Authorization (EUA). This EUA will remain in effect (meaning this test can be used) for the duration of the COVID-19 declaration under Section 564(b)(1) of the Act, 21 U.S.C. section 360bbb-3(b)(1), unless the authorization is terminated or revoked.  Performed at [facility], [address] [place], [place] [address]   Blood Culture (routine x 2)     Status: None (Preliminary result)   Collection Time:  05/09/21 10:00 AM   Specimen: BLOOD  Result Value Ref Range Status   Specimen Description BLOOD RIGHT ANTECUBITAL  Final   Special Requests   Final    BOTTLES DRAWN AEROBIC AND ANAEROBIC Blood Culture results may not be optimal due to an excessive volume of blood received in culture bottles   Culture   Final    NO GROWTH 2 DAYS Performed at [facility], [address] [place], [place] [address]    Report Status PENDING  Incomplete  Blood Culture (routine x 2)     Status: None (Preliminary result)   Collection Time: 05/09/21 10:30 AM   Specimen: BLOOD  Result Value Ref Range Status   Specimen Description BLOOD BLOOD LEFT WRIST  Final   Special Requests   Final    BOTTLES DRAWN AEROBIC AND ANAEROBIC Blood Culture results may not be optimal due to an excessive volume of blood received in culture bottles   Culture   Final     NO GROWTH 2 DAYS Performed at [facility], [address], [place], [place] [address]    Report Status PENDING  Incomplete    Labs: CBC: Recent Labs  Lab 05/09/21 0959 05/10/21 0616  WBC 14.5* 14.0*  NEUTROABS 11.6*  --   HGB 11.1* 10.7*  HCT 34.4* 33.5*  MCV 92.2 95.2  PLT 232 030   Basic Metabolic Panel: Recent Labs  Lab 05/09/21 0959 05/10/21 0616  NA 139 141  K 4.3 4.4  CL 102 104  CO2 26 23  GLUCOSE 197* 147*  BUN 25* 21  CREATININE 1.38* 1.27*  CALCIUM 9.1 9.3   Liver Function Tests: Recent Labs  Lab 05/09/21 0959  AST 14*  ALT 14  ALKPHOS 41  BILITOT 0.5  PROT 6.6  ALBUMIN 3.6   CBG: Recent Labs  Lab 05/10/21 1120 05/10/21 1632 05/10/21 2122 05/11/21 0743 05/11/21 1124  GLUCAP 168* 188* 199* 123* 206*    Discharge time spent: greater than 30 minutes.  Signed: Barton Dubois, MD [facility] 05/11/2021

## 2021-05-11 NOTE — Progress Notes (Signed)
Pt has discharge orders, discharge teaching given and no further questions from pt at this time. Awaiting arrival of transportation from [facility].

## 2021-05-11 NOTE — Care Management Important Message (Signed)
Important Message  Patient Details  Name: Douglas Cook MRN: [id number] Date of Birth: Mar 07, 1946   Medicare Important Message Given:  N/A - LOS <3 / Initial given by admissions     Corey Harold 05/11/2021, 11:55 AM

## 2021-05-11 NOTE — Assessment & Plan Note (Signed)
-  Patient with history of depression/anxiety -Overall mood stable -Resume home psychotropic medications -Continue supportive care and outpatient follow-up.

## 2021-05-11 NOTE — TOC Transition Note (Addendum)
Transition of Care [facility]) - CM/SW Discharge Note   Patient Details  Name: Douglas Cook MRN: [id number] Date of Birth: 1945-08-30  Transition of Care [facility]) CM/SW Contact:  Iona Beard, [place] Phone Number: 05/11/2021, 12:52 PM   Clinical Narrative:    CSW updated pt is ready for D/C back to [facility] today. CSW completed updated Fl2. Fl2, d/c summary and d/c orders sent to facility via [facility]. CSW spoke to Hollis at [facility] who states that they will review the fl2 and reach back out to CSW about setting up transportation. CSW requested that RN get pt ready. CSW updated pts sister Douglas Cook that pt will be d/c back to ALF today. Pts ALF will arrive at 2pm to pick pt up. CSW updated RN and informed facility will call nurses station when they arrive. TOC signing off.   Final next level of care: Assisted Living Barriers to Discharge: Barriers Resolved   Patient Goals and CMS Choice Patient states their goals for this hospitalization and ongoing recovery are:: Return to ALF   Choice offered to / list presented to : Sibling  Discharge Placement              Patient chooses bed at: Other - please specify in the comment section below: ([facility])        Discharge Plan and Services In-house Referral: Clinical Social Work   Post Acute Care Choice: (P) Resumption of Svcs/PTA Provider                               Social Determinants of Health (SDOH) Interventions     Readmission Risk Interventions Readmission Risk Prevention Plan 05/10/2021  Transportation Screening Complete  HRI or [place] Complete  Social Work Consult for [place] Planning/Counseling Complete  Palliative Care Screening Not Applicable  Medication Review Press photographer) Complete  Some recent data might be hidden

## 2021-05-11 NOTE — NC FL2 (Signed)
[place] LEVEL OF CARE SCREENING TOOL     IDENTIFICATION  Patient Name: Douglas Cook Birthdate: [birth year] Sex: male Admission Date (Current Location): 05/09/2021  [place] and [place] Number:  [id number] [place] and Address:  [facility] [address] [place]      Provider Number: [id number]  Attending Physician Name and Address:  Barton Dubois, MD  Relative Name and Phone Number:       Current Level of Care: Hospital Recommended Level of Care: Assisted Living Facility Prior Approval Number:    Date Approved/Denied:   PASRR Number:    Discharge Plan: Other (Comment) (ALF)    Current Diagnoses: Patient Active Problem List   Diagnosis Date Noted   Sepsis secondary to UTI ([place]) 05/09/2021   Neurocognitive deficits 02/10/2021   HTN (hypertension) 02/08/2021   BPH without obstruction/lower urinary tract symptoms 02/08/2021   Anemia due to stage 3b chronic kidney disease ([place]) 02/08/2021   CKD stage 3 due to type 2 diabetes mellitus ([place]) 02/08/2021   Chronic constipation 02/08/2021   Major depression, recurrent, chronic ([place]) 02/08/2021   Closed fracture of neck of left femur (HCC)    Hip fracture ([place]) 02/01/2021   AKI (acute kidney injury) ([place]) 07/04/2017   Chronic kidney disease, stage 3a ([place]) 07/04/2017   COPD (chronic obstructive pulmonary disease) ([place]) 07/04/2017   Type 2 diabetes mellitus ([place]) 07/04/2017   GERD (gastroesophageal reflux disease) 07/04/2017   HLD (hyperlipidemia) 07/04/2017   Depression 07/04/2017    Orientation RESPIRATION BLADDER Height & Weight     Self, Place  Normal Incontinent Weight: 197 lb 1.5 oz (89.4 kg) Height:  6' (182.9 cm)  BEHAVIORAL SYMPTOMS/MOOD NEUROLOGICAL BOWEL NUTRITION STATUS      Incontinent Diet (Carb modified. See d/c summary for updates.)  AMBULATORY STATUS COMMUNICATION OF NEEDS Skin   Extensive Assist Verbally Normal                        Personal Care Assistance Level of Assistance  Bathing, Dressing, Feeding Bathing Assistance: Limited assistance Feeding assistance: Limited assistance Dressing Assistance: Limited assistance     Functional Limitations Info  Hearing, Sight, Speech Sight Info: Impaired Hearing Info: Impaired Speech Info: Adequate    SPECIAL CARE FACTORS FREQUENCY                       Contractures      Additional Factors Info  Code Status, Allergies, Psychotropic Code Status Info: Full code Allergies Info: No known allergies Psychotropic Info: Celexa, Trazodone, Risperdal         Current Medications (05/11/2021):  This is the current hospital active medication list Current Facility-Administered Medications  Medication Dose Route Frequency Provider Last Rate Last Admin   0.9 %  sodium chloride infusion  250 mL Intravenous PRN Dessa Phi, DO 10 mL/hr at 05/09/21 2256 250 mL at 05/09/21 2256   acetaminophen (TYLENOL) tablet 650 mg  650 mg Oral Q6H PRN Dessa Phi, DO       Or   acetaminophen (TYLENOL) suppository 650 mg  650 mg Rectal Q6H PRN Dessa Phi, DO       aspirin EC tablet 81 mg  81 mg Oral Daily Dessa Phi, DO   81 mg at 05/11/21 0804   atorvastatin (LIPITOR) tablet 40 mg  40 mg Oral Daily Dessa Phi, DO   40 mg at 05/11/21 0804   cefTRIAXone (ROCEPHIN) 1 g in sodium  chloride 0.9 % 100 mL IVPB  1 g Intravenous Q24H Dessa Phi, DO   Stopped at 05/10/21 2304   citalopram (CELEXA) tablet 20 mg  20 mg Oral QHS Dessa Phi, DO   20 mg at 05/10/21 2051   heparin injection 5,000 Units  5,000 Units Subcutaneous Q8H Dessa Phi, DO   5,000 Units at 05/11/21 0533   insulin aspart (novoLOG) injection 0-9 Units  0-9 Units Subcutaneous TID WC Dessa Phi, DO   1 Units at 05/11/21 [id number]   insulin glargine-yfgn (SEMGLEE) injection 30 Units  30 Units Subcutaneous QHS Dessa Phi, DO   30 Units at 05/10/21 2227   lamoTRIgine (LAMICTAL) tablet 100 mg  100 mg  Oral Daily Dessa Phi, DO   100 mg at 05/11/21 0804   lisinopril (ZESTRIL) tablet 2.5 mg  2.5 mg Oral Daily Dessa Phi, DO   2.5 mg at 05/11/21 0805   ondansetron (ZOFRAN) tablet 4 mg  4 mg Oral Q6H PRN Dessa Phi, DO       Or   ondansetron [facility]) injection 4 mg  4 mg Intravenous Q6H PRN Dessa Phi, DO       pantoprazole (PROTONIX) EC tablet 40 mg  40 mg Oral Daily Dessa Phi, DO   40 mg at 05/11/21 0804   polyethylene glycol (MIRALAX / GLYCOLAX) packet 17 g  17 g Oral Daily PRN Dessa Phi, DO       risperiDONE (RISPERDAL) tablet 0.5 mg  0.5 mg Oral QHS Dessa Phi, DO   0.5 mg at 05/10/21 2051   sodium chloride flush (NS) 0.9 % injection 3 mL  3 mL Intravenous Q12H Dessa Phi, DO   3 mL at 05/11/21 [id number]   sodium chloride flush (NS) 0.9 % injection 3 mL  3 mL Intravenous PRN Dessa Phi, DO       tamsulosin [facility]) capsule 0.4 mg  0.4 mg Oral Daily Dessa Phi, DO   0.4 mg at 05/11/21 0804   traZODone (DESYREL) tablet 100 mg  100 mg Oral QHS Dessa Phi, DO   100 mg at 05/10/21 2051     Discharge Medications: TAKE these medications     acetaminophen 325 MG tablet Commonly known as: TYLENOL Take 2 tablets (650 mg total) by mouth every 6 (six) hours as needed for mild pain (or Fever >/= 101).    albuterol 108 (90 Base) MCG/ACT inhaler Commonly known as: VENTOLIN HFA Inhale 1-2 puffs into the lungs every 4 (four) hours as needed for wheezing or shortness of breath.    alendronate 70 MG tablet Commonly known as: FOSAMAX Take 70 mg by mouth once a week.    Alpha-Lipoic Acid 300 MG Caps Take 1 capsule (300 mg total) by mouth daily at 12 noon. At 10 pm    aspirin EC 81 MG tablet Take 1 tablet (81 mg total) by mouth daily. Swallow whole.    atorvastatin 20 MG tablet Commonly known as: LIPITOR Take 1 tablet (20 mg total) by mouth daily. What changed: how much to take    cefdinir 300 MG capsule Commonly known as: OMNICEF Take 1 capsule (300  mg total) by mouth 2 (two) times daily for 6 days.    cetirizine 10 MG tablet Commonly known as: ZYRTEC Take 1 tablet (10 mg total) by mouth at bedtime.    cholecalciferol 25 MCG (1000 UNIT) tablet Commonly known as: VITAMIN D Take 1 tablet (1,000 Units total) by mouth daily.    citalopram 20 MG tablet Commonly known  as: CELEXA Take 1 tablet (20 mg total) by mouth at bedtime.    Combivent Respimat 20-100 MCG/ACT Aers respimat Generic drug: Ipratropium-Albuterol Inhale 2 puffs into the lungs 2 (two) times daily.    feeding supplement (GLUCERNA SHAKE) Liqd Take 237 mLs by mouth daily.    FeroSul 325 (65 FE) MG tablet Generic drug: ferrous sulfate Take 1 tablet (325 mg total) by mouth 2 (two) times daily.    fluticasone 50 MCG/ACT nasal spray Commonly known as: FLONASE Place 1 spray into both nostrils daily.    lamoTRIgine 100 MG tablet Commonly known as: LAMICTAL Take 1 tablet (100 mg total) by mouth daily.    Levemir FlexTouch 100 UNIT/ML FlexPen Generic drug: insulin detemir Inject 30 Units into the skin at bedtime.    lisinopril 2.5 MG tablet Commonly known as: ZESTRIL Take 1 tablet (2.5 mg total) by mouth daily.    metFORMIN 500 MG 24 hr tablet Commonly known as: GLUCOPHAGE-XR Take 2 tablets (1,000 mg total) by mouth daily with breakfast. What changed: when to take this    NovoLOG FlexPen 100 UNIT/ML FlexPen Generic drug: insulin aspart Inject 8 Units into the skin 3 (three) times daily.    omeprazole 20 MG capsule Commonly known as: PRILOSEC Take 1 capsule (20 mg total) by mouth daily.    risperiDONE 0.5 MG tablet Commonly known as: RISPERDAL Take 1 tablet (0.5 mg total) by mouth at bedtime.    senna-docusate 8.6-50 MG tablet Commonly known as: Senokot-S Take 1 tablet by mouth at bedtime as needed for mild constipation.    Systane Balance 0.6 % Soln Generic drug: Propylene Glycol Apply 1 drop to eye 2 (two) times daily. Both eyes    tamsulosin 0.4  MG Caps capsule Commonly known as: FLOMAX Take 1 capsule (0.4 mg total) by mouth daily.    Tradjenta 5 MG Tabs tablet Generic drug: linagliptin Take 1 tablet (5 mg total) by mouth daily.    traZODone 100 MG tablet Commonly known as: DESYREL Take 1 tablet (100 mg total) by mouth at bedtime.    Relevant Imaging Results:  Relevant Lab Results:   Additional Information PT SSN [id number].  Iona Beard, LCSWA

## 2021-05-14 LAB — CULTURE, BLOOD (ROUTINE X 2)
Culture: NO GROWTH
Culture: NO GROWTH

## 2021-06-09 ENCOUNTER — Other Ambulatory Visit: Payer: Self-pay | Admitting: Internal Medicine

## 2021-06-18 ENCOUNTER — Inpatient Hospital Stay (HOSPITAL_COMMUNITY)
Admission: EM | Admit: 2021-06-18 | Discharge: 2021-06-20 | DRG: 689 | Disposition: A | Discharge status: Home Health | Payer: Medicare Other | Attending: Internal Medicine | Admitting: Internal Medicine

## 2021-06-18 ENCOUNTER — Other Ambulatory Visit: Payer: Self-pay

## 2021-06-18 ENCOUNTER — Emergency Department (HOSPITAL_COMMUNITY): Payer: Medicare Other

## 2021-06-18 ENCOUNTER — Encounter (HOSPITAL_COMMUNITY): Payer: Self-pay | Admitting: *Deleted

## 2021-06-18 DIAGNOSIS — Z20822 Contact with and (suspected) exposure to covid-19: Secondary | ICD-10-CM | POA: Diagnosis present

## 2021-06-18 DIAGNOSIS — F0390 Unspecified dementia without behavioral disturbance: Secondary | ICD-10-CM

## 2021-06-18 DIAGNOSIS — Z79899 Other long term (current) drug therapy: Secondary | ICD-10-CM

## 2021-06-18 DIAGNOSIS — Z7982 Long term (current) use of aspirin: Secondary | ICD-10-CM

## 2021-06-18 DIAGNOSIS — Z823 Family history of stroke: Secondary | ICD-10-CM

## 2021-06-18 DIAGNOSIS — E782 Mixed hyperlipidemia: Secondary | ICD-10-CM | POA: Diagnosis present

## 2021-06-18 DIAGNOSIS — N179 Acute kidney failure, unspecified: Secondary | ICD-10-CM | POA: Diagnosis present

## 2021-06-18 DIAGNOSIS — Z833 Family history of diabetes mellitus: Secondary | ICD-10-CM | POA: Diagnosis not present

## 2021-06-18 DIAGNOSIS — F32A Depression, unspecified: Secondary | ICD-10-CM | POA: Diagnosis present

## 2021-06-18 DIAGNOSIS — R4189 Other symptoms and signs involving cognitive functions and awareness: Secondary | ICD-10-CM | POA: Diagnosis present

## 2021-06-18 DIAGNOSIS — R102 Pelvic and perineal pain: Secondary | ICD-10-CM | POA: Diagnosis present

## 2021-06-18 DIAGNOSIS — B9620 Unspecified Escherichia coli [E. coli] as the cause of diseases classified elsewhere: Secondary | ICD-10-CM | POA: Diagnosis present

## 2021-06-18 DIAGNOSIS — N1831 Chronic kidney disease, stage 3a: Secondary | ICD-10-CM | POA: Diagnosis not present

## 2021-06-18 DIAGNOSIS — F0393 Unspecified dementia, unspecified severity, with mood disturbance: Secondary | ICD-10-CM | POA: Diagnosis present

## 2021-06-18 DIAGNOSIS — E785 Hyperlipidemia, unspecified: Secondary | ICD-10-CM | POA: Diagnosis present

## 2021-06-18 DIAGNOSIS — E869 Volume depletion, unspecified: Secondary | ICD-10-CM | POA: Diagnosis present

## 2021-06-18 DIAGNOSIS — N3001 Acute cystitis with hematuria: Secondary | ICD-10-CM | POA: Diagnosis not present

## 2021-06-18 DIAGNOSIS — I129 Hypertensive chronic kidney disease with stage 1 through stage 4 chronic kidney disease, or unspecified chronic kidney disease: Secondary | ICD-10-CM | POA: Diagnosis present

## 2021-06-18 DIAGNOSIS — Z8619 Personal history of other infectious and parasitic diseases: Secondary | ICD-10-CM

## 2021-06-18 DIAGNOSIS — I10 Essential (primary) hypertension: Secondary | ICD-10-CM | POA: Diagnosis present

## 2021-06-18 DIAGNOSIS — E119 Type 2 diabetes mellitus without complications: Secondary | ICD-10-CM

## 2021-06-18 DIAGNOSIS — J449 Chronic obstructive pulmonary disease, unspecified: Secondary | ICD-10-CM | POA: Diagnosis present

## 2021-06-18 DIAGNOSIS — Z87891 Personal history of nicotine dependence: Secondary | ICD-10-CM | POA: Diagnosis not present

## 2021-06-18 DIAGNOSIS — N390 Urinary tract infection, site not specified: Secondary | ICD-10-CM | POA: Diagnosis not present

## 2021-06-18 DIAGNOSIS — Z993 Dependence on wheelchair: Secondary | ICD-10-CM

## 2021-06-18 DIAGNOSIS — Z7983 Long term (current) use of bisphosphonates: Secondary | ICD-10-CM

## 2021-06-18 DIAGNOSIS — E1122 Type 2 diabetes mellitus with diabetic chronic kidney disease: Secondary | ICD-10-CM | POA: Diagnosis present

## 2021-06-18 DIAGNOSIS — R319 Hematuria, unspecified: Secondary | ICD-10-CM

## 2021-06-18 DIAGNOSIS — Z96642 Presence of left artificial hip joint: Secondary | ICD-10-CM | POA: Diagnosis present

## 2021-06-18 DIAGNOSIS — G9341 Metabolic encephalopathy: Secondary | ICD-10-CM | POA: Diagnosis not present

## 2021-06-18 DIAGNOSIS — D631 Anemia in chronic kidney disease: Secondary | ICD-10-CM | POA: Diagnosis present

## 2021-06-18 DIAGNOSIS — T464X5A Adverse effect of angiotensin-converting-enzyme inhibitors, initial encounter: Secondary | ICD-10-CM | POA: Diagnosis present

## 2021-06-18 DIAGNOSIS — K219 Gastro-esophageal reflux disease without esophagitis: Secondary | ICD-10-CM | POA: Diagnosis present

## 2021-06-18 DIAGNOSIS — M1990 Unspecified osteoarthritis, unspecified site: Secondary | ICD-10-CM | POA: Diagnosis present

## 2021-06-18 DIAGNOSIS — Z7984 Long term (current) use of oral hypoglycemic drugs: Secondary | ICD-10-CM

## 2021-06-18 HISTORY — DX: Unspecified osteoarthritis, unspecified site: M19.90

## 2021-06-18 HISTORY — DX: Gastro-esophageal reflux disease without esophagitis: K21.9

## 2021-06-18 LAB — HEMOGLOBIN A1C
Hgb A1c MFr Bld: 7 % — ABNORMAL HIGH (ref 4.8–5.6)
Mean Plasma Glucose: 154.2 mg/dL

## 2021-06-18 LAB — COMPREHENSIVE METABOLIC PANEL
ALT: 12 U/L (ref 0–44)
AST: 12 U/L — ABNORMAL LOW (ref 15–41)
Albumin: 3.4 g/dL — ABNORMAL LOW (ref 3.5–5.0)
Alkaline Phosphatase: 37 U/L — ABNORMAL LOW (ref 38–126)
Anion gap: 8 (ref 5–15)
BUN: 29 mg/dL — ABNORMAL HIGH (ref 8–23)
CO2: 29 mmol/L (ref 22–32)
Calcium: 9.4 mg/dL (ref 8.9–10.3)
Chloride: 102 mmol/L (ref 98–111)
Creatinine, Ser: 1.74 mg/dL — ABNORMAL HIGH (ref 0.61–1.24)
GFR, Estimated: 41 mL/min — ABNORMAL LOW (ref >60)
Glucose, Bld: 187 mg/dL — ABNORMAL HIGH (ref 70–99)
Potassium: 4 mmol/L (ref 3.5–5.1)
Sodium: 139 mmol/L (ref 135–145)
Total Bilirubin: 0.8 mg/dL (ref 0.3–1.2)
Total Protein: 6.6 g/dL (ref 6.5–8.1)

## 2021-06-18 LAB — URINALYSIS, ROUTINE W REFLEX MICROSCOPIC
Bilirubin Urine: NEGATIVE
Glucose, UA: NEGATIVE mg/dL
Ketones, ur: NEGATIVE mg/dL
Nitrite: POSITIVE — AB
Protein, ur: 100 mg/dL — AB
RBC / HPF: >50 RBC/hpf — ABNORMAL HIGH (ref 0–5)
Specific Gravity, Urine: 1.016 (ref 1.005–1.030)
WBC, UA: >50 WBC/hpf — ABNORMAL HIGH (ref 0–5)
pH: 5 (ref 5.0–8.0)

## 2021-06-18 LAB — CBC WITH DIFFERENTIAL/PLATELET
Abs Immature Granulocytes: 0.03 10*3/uL (ref 0.00–0.07)
Basophils Absolute: 0.1 10*3/uL (ref 0.0–0.1)
Basophils Relative: 1 %
Eosinophils Absolute: 0.1 10*3/uL (ref 0.0–0.5)
Eosinophils Relative: 1 %
HCT: 34.3 % — ABNORMAL LOW (ref 39.0–52.0)
Hemoglobin: 11 g/dL — ABNORMAL LOW (ref 13.0–17.0)
Immature Granulocytes: 0 %
Lymphocytes Relative: 10 %
Lymphs Abs: 1.2 10*3/uL (ref 0.7–4.0)
MCH: 30.1 pg (ref 26.0–34.0)
MCHC: 32.1 g/dL (ref 30.0–36.0)
MCV: 93.7 fL (ref 80.0–100.0)
Monocytes Absolute: 1.3 10*3/uL — ABNORMAL HIGH (ref 0.1–1.0)
Monocytes Relative: 11 %
Neutro Abs: 9 10*3/uL — ABNORMAL HIGH (ref 1.7–7.7)
Neutrophils Relative %: 77 %
Platelets: 195 10*3/uL (ref 150–400)
RBC: 3.66 MIL/uL — ABNORMAL LOW (ref 4.22–5.81)
RDW: 14.1 % (ref 11.5–15.5)
WBC: 11.6 10*3/uL — ABNORMAL HIGH (ref 4.0–10.5)
nRBC: 0 % (ref 0.0–0.2)

## 2021-06-18 LAB — LACTIC ACID, PLASMA
Lactic Acid, Venous: 1 mmol/L (ref 0.5–1.9)
Lactic Acid, Venous: 1 mmol/L (ref 0.5–1.9)

## 2021-06-18 LAB — GLUCOSE, CAPILLARY
Glucose-Capillary: 169 mg/dL — ABNORMAL HIGH (ref 70–99)
Glucose-Capillary: 188 mg/dL — ABNORMAL HIGH (ref 70–99)

## 2021-06-18 LAB — PROTIME-INR
INR: 1.1 (ref 0.8–1.2)
Prothrombin Time: 14.2 seconds (ref 11.4–15.2)

## 2021-06-18 LAB — APTT: aPTT: 30 seconds (ref 24–36)

## 2021-06-18 LAB — RESP PANEL BY RT-PCR (FLU A&B, COVID) ARPGX2
Influenza A by PCR: NEGATIVE
Influenza B by PCR: NEGATIVE
SARS Coronavirus 2 by RT PCR: NEGATIVE

## 2021-06-18 MED ORDER — RISPERIDONE 0.5 MG PO TABS
0.5000 mg | ORAL_TABLET | Freq: Every day | ORAL | Status: DC
  Administered 2021-06-18 – 2021-06-19 (×2): 0.5 mg via ORAL
  Filled 2021-06-18 (×2): qty 1

## 2021-06-18 MED ORDER — SODIUM CHLORIDE 0.9 % IV BOLUS
1000.0000 mL | Freq: Once | INTRAVENOUS | Status: AC
  Administered 2021-06-18: 1000 mL via INTRAVENOUS

## 2021-06-18 MED ORDER — VITAMIN D 25 MCG (1000 UNIT) PO TABS
1000.0000 [IU] | ORAL_TABLET | Freq: Every day | ORAL | Status: DC
  Administered 2021-06-19 – 2021-06-20 (×2): 1000 [IU] via ORAL
  Filled 2021-06-18 (×5): qty 1

## 2021-06-18 MED ORDER — ENOXAPARIN SODIUM 40 MG/0.4ML IJ SOSY
40.0000 mg | PREFILLED_SYRINGE | INTRAMUSCULAR | Status: DC
  Administered 2021-06-18 – 2021-06-19 (×2): 40 mg via SUBCUTANEOUS
  Filled 2021-06-18 (×2): qty 0.4

## 2021-06-18 MED ORDER — ASPIRIN EC 81 MG PO TBEC
81.0000 mg | DELAYED_RELEASE_TABLET | Freq: Every day | ORAL | Status: DC
Start: 2021-06-18 — End: 2021-06-20
  Administered 2021-06-18 – 2021-06-20 (×3): 81 mg via ORAL
  Filled 2021-06-18 (×3): qty 1

## 2021-06-18 MED ORDER — INSULIN ASPART 100 UNIT/ML IJ SOLN: 0.0000 [IU] | Freq: Every day | INTRAMUSCULAR | Status: DC

## 2021-06-18 MED ORDER — FERROUS SULFATE 325 (65 FE) MG PO TABS
325.0000 mg | ORAL_TABLET | Freq: Two times a day (BID) | ORAL | Status: DC
  Administered 2021-06-18 – 2021-06-20 (×4): 325 mg via ORAL
  Filled 2021-06-18 (×4): qty 1

## 2021-06-18 MED ORDER — LAMOTRIGINE 100 MG PO TABS
100.0000 mg | ORAL_TABLET | Freq: Every day | ORAL | Status: DC
  Administered 2021-06-18 – 2021-06-20 (×3): 100 mg via ORAL
  Filled 2021-06-18: qty 1
  Filled 2021-06-18: qty 4
  Filled 2021-06-18: qty 1

## 2021-06-18 MED ORDER — IPRATROPIUM-ALBUTEROL 20-100 MCG/ACT IN AERS
2.0000 | INHALATION_SPRAY | Freq: Two times a day (BID) | RESPIRATORY_TRACT | Status: DC
  Filled 2021-06-18: qty 4

## 2021-06-18 MED ORDER — INSULIN ASPART 100 UNIT/ML IJ SOLN
0.0000 [IU] | Freq: Three times a day (TID) | INTRAMUSCULAR | Status: DC
  Administered 2021-06-18 – 2021-06-19 (×3): 1 [IU] via SUBCUTANEOUS
  Administered 2021-06-19: 2 [IU] via SUBCUTANEOUS
  Administered 2021-06-20: 1 [IU] via SUBCUTANEOUS
  Administered 2021-06-20: 4 [IU] via SUBCUTANEOUS

## 2021-06-18 MED ORDER — TAMSULOSIN HCL 0.4 MG PO CAPS
0.4000 mg | ORAL_CAPSULE | Freq: Every day | ORAL | Status: DC
  Administered 2021-06-18 – 2021-06-20 (×3): 0.4 mg via ORAL
  Filled 2021-06-18 (×3): qty 1

## 2021-06-18 MED ORDER — IPRATROPIUM-ALBUTEROL 0.5-2.5 (3) MG/3ML IN SOLN
3.0000 mL | Freq: Two times a day (BID) | RESPIRATORY_TRACT | Status: DC
  Administered 2021-06-19: 3 mL via RESPIRATORY_TRACT
  Filled 2021-06-18: qty 3

## 2021-06-18 MED ORDER — ONDANSETRON HCL 4 MG PO TABS: 4.0000 mg | ORAL_TABLET | Freq: Four times a day (QID) | ORAL | Status: DC | PRN

## 2021-06-18 MED ORDER — ACETAMINOPHEN 650 MG RE SUPP
650.0000 mg | Freq: Four times a day (QID) | RECTAL | Status: DC | PRN
Start: 2021-06-18 — End: 2021-06-20

## 2021-06-18 MED ORDER — LORATADINE 10 MG PO TABS
10.0000 mg | ORAL_TABLET | Freq: Every day | ORAL | Status: DC
  Administered 2021-06-18 – 2021-06-20 (×3): 10 mg via ORAL
  Filled 2021-06-18 (×3): qty 1

## 2021-06-18 MED ORDER — ACETAMINOPHEN 325 MG PO TABS: 650.0000 mg | ORAL_TABLET | Freq: Four times a day (QID) | ORAL | Status: DC | PRN

## 2021-06-18 MED ORDER — IPRATROPIUM-ALBUTEROL 0.5-2.5 (3) MG/3ML IN SOLN
RESPIRATORY_TRACT | Status: AC
  Administered 2021-06-18: 3 mL
  Filled 2021-06-18: qty 3

## 2021-06-18 MED ORDER — ONDANSETRON HCL 4 MG/2ML IJ SOLN: 4.0000 mg | Freq: Four times a day (QID) | INTRAMUSCULAR | Status: DC | PRN

## 2021-06-18 MED ORDER — GLUCERNA SHAKE PO LIQD
237.0000 mL | Freq: Every day | ORAL | Status: DC
  Administered 2021-06-18: 237 mL via ORAL
  Filled 2021-06-18 (×2): qty 237

## 2021-06-18 MED ORDER — LACTATED RINGERS IV SOLN: INTRAVENOUS | Status: AC

## 2021-06-18 MED ORDER — ATORVASTATIN CALCIUM 10 MG PO TABS
20.0000 mg | ORAL_TABLET | Freq: Every day | ORAL | Status: DC
Start: 2021-06-18 — End: 2021-06-20
  Administered 2021-06-18 – 2021-06-20 (×3): 20 mg via ORAL
  Filled 2021-06-18 (×3): qty 2

## 2021-06-18 MED ORDER — LINAGLIPTIN 5 MG PO TABS
5.0000 mg | ORAL_TABLET | Freq: Every day | ORAL | Status: DC
Start: 2021-06-18 — End: 2021-06-20
  Administered 2021-06-18 – 2021-06-20 (×3): 5 mg via ORAL
  Filled 2021-06-18 (×3): qty 1

## 2021-06-18 MED ORDER — CEFTRIAXONE SODIUM 2 G IJ SOLR
2.0000 g | Freq: Once | INTRAMUSCULAR | Status: AC
  Administered 2021-06-18: 2 g via INTRAVENOUS
  Filled 2021-06-18: qty 20

## 2021-06-18 MED ORDER — INSULIN GLARGINE-YFGN 100 UNIT/ML ~~LOC~~ SOLN
20.0000 [IU] | Freq: Every day | SUBCUTANEOUS | Status: DC
  Administered 2021-06-19: 20 [IU] via SUBCUTANEOUS
  Filled 2021-06-18 (×3): qty 0.2

## 2021-06-18 MED ORDER — FLUTICASONE PROPIONATE 50 MCG/ACT NA SUSP
1.0000 | Freq: Every day | NASAL | Status: DC
  Administered 2021-06-18 – 2021-06-20 (×3): 1 via NASAL
  Filled 2021-06-18 (×2): qty 16

## 2021-06-18 MED ORDER — SODIUM CHLORIDE 0.9 % IV SOLN
1.0000 g | INTRAVENOUS | Status: DC
  Administered 2021-06-19: 1 g via INTRAVENOUS
  Filled 2021-06-18: qty 10

## 2021-06-18 MED ORDER — CITALOPRAM HYDROBROMIDE 20 MG PO TABS
20.0000 mg | ORAL_TABLET | Freq: Every day | ORAL | Status: DC
  Administered 2021-06-18 – 2021-06-19 (×2): 20 mg via ORAL
  Filled 2021-06-18 (×2): qty 1

## 2021-06-18 MED ORDER — PANTOPRAZOLE SODIUM 40 MG PO TBEC
40.0000 mg | DELAYED_RELEASE_TABLET | Freq: Every day | ORAL | Status: DC
  Administered 2021-06-18 – 2021-06-20 (×3): 40 mg via ORAL
  Filled 2021-06-18 (×3): qty 1

## 2021-06-18 NOTE — Hospital Course (Addendum)
28 old male with a history of cognitive impairment, CKD stage III, diabetes mellitus type 2, hyperlipidemia, hypertension, depression presenting from [facility] with generalized weakness and confusion of 1 day duration.  The patient was recently admitted to the hospital from 05/09/2021 to 05/11/2021 for sepsis secondary to UTI.  Urine cultures grew E. coli.  The patient was treated with ceftriaxone and discharged home with cefdinir.  The patient is a poor historian secondary to his cognitive impairment.  Apparently, the patient had much difficulty getting out of bed on the morning of 06/18/2021.  As result, patient was brought to emergency department for further evaluation.  At baseline, patient is able to ambulate a few steps with a walker, but he mostly gets around in a wheelchair.  He does need assistance with bathing and clothing. ?There have been no reports of fevers, headache, chest pain, short of breath, coughing, hemoptysis, nausea, vomiting, diarrhea.  The patient himself does complain of some dysuria and suprapubic pain. ?In the ED, the patient was afebrile hemodynamically stable with oxygen saturation of 98% on room air.  WBC 11.6, hemoglobin 1.0, platelets 195,000.  Sodium 139, potassium 4.0, bicarbonate 29, serum creatinine 1.74.  LFTs were unremarkable.  CT of the brain was negative for any acute findings.  UA showed >50 WBC.  Patient was started on IV fluids and ceftriaxone.  Notably, Foley catheter was placed in the emergency department with return of 600 cc of urine.  He was admitted for further evaluation and treatment. ? ?3/27:  more alert and conversant.  Foley removed.  Still waiting for UO after foley out.  Continue IVF. ? ?3/28:  mental status overall improved, stable.  A&O x 2 which is his usual baseline.  D/C back to [facility] with cefdinir x 5 more days.  Also set up [place].  Final culture susceptibility not back but prelim was Ecoli.  Pt with Ecoli on month ago sensitive to cefdinir.  Able to  urinate spontaneously without foley. ?

## 2021-06-18 NOTE — Assessment & Plan Note (Signed)
Continue Celexa, Lamictal, Risperdal ?

## 2021-06-18 NOTE — Assessment & Plan Note (Addendum)
Holding lisinopril in the setting of progression of CKD ?BP remains controlled ? ?

## 2021-06-18 NOTE — Assessment & Plan Note (Addendum)
Secondary to volume depletion in the setting of lisinopril ?Baseline creatinine 1.2-1.3 previously ?Presented with serum creatinine 1.74 ?Continue IV fluids>.improving ?Pt likely has progression of his CKD with new baseline 1.3-1.5 ?

## 2021-06-18 NOTE — Assessment & Plan Note (Addendum)
Continue Lamictal, Risperdal, and trazodone ?A&O x 2 at baseline ?

## 2021-06-18 NOTE — ED Provider Notes (Signed)
?[facility] ?Provider Note ? ? ?CSN: [id number] ?Arrival date & time: 06/18/21  [id number] ? ?  ? ?History ? ?Chief Complaint  ?Patient presents with  ? Altered Mental Status  ? ? ?Douglas Cook is a 76 y.o. male. ? ?Patient brought over from his either his nursing home or assisted living facility because of worsening mental capacity.  Patient has a history of diabetes COPD, ? ?The history is provided by the patient and the nursing home. No language interpreter was used.  ?Altered Mental Status ?Presenting symptoms: behavior changes   ?Severity:  Moderate ?Most recent episode:  Today ?Episode history:  Continuous ?Timing:  Constant ?Progression:  Waxing and waning ?Chronicity:  Recurrent ?Context: not alcohol use   ?Associated symptoms: no abdominal pain, no hallucinations, no headaches, no rash and no seizures   ? ?  ? ?Home Medications ?Prior to Admission medications   ?Medication Sig Start Date End Date Taking? Authorizing Provider  ?acetaminophen (TYLENOL) 325 MG tablet Take 2 tablets (650 mg total) by mouth every 6 (six) hours as needed for mild pain (or Fever >/= 101). 03/22/21   Gerlene Fee, NP  ?albuterol (VENTOLIN HFA) 108 (90 Base) MCG/ACT inhaler Inhale 1-2 puffs into the lungs every 4 (four) hours as needed for wheezing or shortness of breath.    [provider]  ?alendronate (FOSAMAX) 70 MG tablet Take 70 mg by mouth once a week. 04/12/21   [provider]  ?Alpha-Lipoic Acid 300 MG CAPS Take 1 capsule (300 mg total) by mouth daily at 12 noon. At 10 pm 03/22/21   Gerlene Fee, NP  ?aspirin EC 81 MG tablet Take 1 tablet (81 mg total) by mouth daily. Swallow whole. 03/22/21   Gerlene Fee, NP  ?atorvastatin (LIPITOR) 20 MG tablet Take 1 tablet (20 mg total) by mouth daily. ?Patient taking differently: Take 40 mg by mouth daily. 03/22/21   Gerlene Fee, NP  ?cetirizine (ZYRTEC) 10 MG tablet Take 1 tablet (10 mg total) by mouth at bedtime. 03/22/21   Gerlene Fee, NP  ?cholecalciferol (VITAMIN D) 25 MCG (1000 UNIT) tablet Take 1 tablet (1,000 Units total) by mouth daily. 03/22/21   Gerlene Fee, NP  ?citalopram (CELEXA) 20 MG tablet Take 1 tablet (20 mg total) by mouth at bedtime. 03/22/21   Gerlene Fee, NP  ?feeding supplement, GLUCERNA SHAKE, (GLUCERNA SHAKE) LIQD Take 237 mLs by mouth daily. 03/22/21   Gerlene Fee, NP  ?FEROSUL 325 (65 Fe) MG tablet Take 1 tablet (325 mg total) by mouth 2 (two) times daily. 03/22/21   Gerlene Fee, NP  ?fluticasone (FLONASE) 50 MCG/ACT nasal spray Place 1 spray into both nostrils daily. 03/22/21   Gerlene Fee, NP  ?Ipratropium-Albuterol (COMBIVENT RESPIMAT) 20-100 MCG/ACT AERS respimat Inhale 2 puffs into the lungs 2 (two) times daily. 03/22/21   Gerlene Fee, NP  ?lamoTRIgine (LAMICTAL) 100 MG tablet Take 1 tablet (100 mg total) by mouth daily. 03/22/21   Gerlene Fee, NP  ?LEVEMIR FLEXTOUCH 100 UNIT/ML FlexTouch Pen Inject 30 Units into the skin at bedtime. 03/22/21   Gerlene Fee, NP  ?lisinopril (ZESTRIL) 2.5 MG tablet Take 1 tablet (2.5 mg total) by mouth daily. 03/22/21   Gerlene Fee, NP  ?metFORMIN (GLUCOPHAGE-XR) 500 MG 24 hr tablet Take 2 tablets (1,000 mg total) by mouth daily with breakfast. ?Patient taking differently: Take 1,000 mg by mouth in the morning and at bedtime. 03/22/21  Gerlene Fee, NP  ?NOVOLOG FLEXPEN 100 UNIT/ML FlexPen Inject 8 Units into the skin 3 (three) times daily. 03/22/21   [provider]  ?omeprazole (PRILOSEC) 20 MG capsule Take 1 capsule (20 mg total) by mouth daily. 03/22/21   Gerlene Fee, NP  ?Propylene Glycol (SYSTANE BALANCE) 0.6 % SOLN Apply 1 drop to eye 2 (two) times daily. Both eyes 03/22/21   Gerlene Fee, NP  ?risperiDONE (RISPERDAL) 0.5 MG tablet Take 1 tablet (0.5 mg total) by mouth at bedtime. 03/22/21   Gerlene Fee, NP  ?senna-docusate (SENOKOT-S) 8.6-50 MG tablet Take 1 tablet by mouth at bedtime as needed  for mild constipation. 03/22/21   Gerlene Fee, NP  ?tamsulosin (FLOMAX) 0.4 MG CAPS capsule Take 1 capsule (0.4 mg total) by mouth daily. 03/22/21   Gerlene Fee, NP  ?TRADJENTA 5 MG TABS tablet Take 1 tablet (5 mg total) by mouth daily. 03/22/21   Gerlene Fee, NP  ?traZODone (DESYREL) 100 MG tablet Take 1 tablet (100 mg total) by mouth at bedtime. 03/22/21   Gerlene Fee, NP  ?   ? ?Allergies    ?Patient has no known allergies.   ? ?Review of Systems   ?Review of Systems  ?Constitutional:  Positive for fatigue. Negative for appetite change.  ?HENT:  Negative for congestion, ear discharge and sinus pressure.   ?Eyes:  Negative for discharge.  ?Respiratory:  Negative for cough.   ?Cardiovascular:  Negative for chest pain.  ?Gastrointestinal:  Negative for abdominal pain and diarrhea.  ?Genitourinary:  Negative for frequency and hematuria.  ?Musculoskeletal:  Negative for back pain.  ?Skin:  Negative for rash.  ?Neurological:  Negative for seizures and headaches.  ?Psychiatric/Behavioral:  Negative for hallucinations.   ? ?Physical Exam ?Updated Vital Signs ?BP (!) 119/54   Pulse 91   Temp 99.8 ?F (37.7 ?C) (Oral)   Resp 18   Ht 6' (1.829 m)   Wt 89.4 kg   SpO2 98%   BMI 26.73 kg/m?  ?Physical Exam ?Vitals and nursing note reviewed.  ?Constitutional:   ?   Appearance: He is well-developed.  ?   Comments: Mild lethargy  ?HENT:  ?   Head: Normocephalic.  ?   Nose: Nose normal.  ?Eyes:  ?   General: No scleral icterus. ?   Conjunctiva/sclera: Conjunctivae normal.  ?Neck:  ?   Thyroid: No thyromegaly.  ?Cardiovascular:  ?   Rate and Rhythm: Normal rate and regular rhythm.  ?   Heart sounds: No murmur heard. ?  No friction rub. No gallop.  ?Pulmonary:  ?   Breath sounds: No stridor. No wheezing or rales.  ?Chest:  ?   Chest wall: No tenderness.  ?Abdominal:  ?   General: There is no distension.  ?   Tenderness: There is no abdominal tenderness. There is no rebound.  ?Musculoskeletal:     ?    General: Normal range of motion.  ?   Cervical back: Neck supple.  ?Lymphadenopathy:  ?   Cervical: No cervical adenopathy.  ?Skin: ?   Findings: No erythema or rash.  ?Neurological:  ?   Mental Status: He is oriented to person, place, and time.  ?   Motor: No abnormal muscle tone.  ?   Coordination: Coordination normal.  ?Psychiatric:     ?   Behavior: Behavior normal.  ? ? ?ED Results / Procedures / Treatments   ?Labs ?(all labs ordered are listed, but only  abnormal results are displayed) ?Labs Reviewed  ?COMPREHENSIVE METABOLIC PANEL - Abnormal; Notable for the following components:  ?    Result Value  ? Glucose, Bld 187 (*)   ? BUN 29 (*)   ? Creatinine, Ser 1.74 (*)   ? Albumin 3.4 (*)   ? AST 12 (*)   ? Alkaline Phosphatase 37 (*)   ? GFR, Estimated 41 (*)   ? All other components within normal limits  ?CBC WITH DIFFERENTIAL/PLATELET - Abnormal; Notable for the following components:  ? WBC 11.6 (*)   ? RBC 3.66 (*)   ? Hemoglobin 11.0 (*)   ? HCT 34.3 (*)   ? Neutro Abs 9.0 (*)   ? Monocytes Absolute 1.3 (*)   ? All other components within normal limits  ?URINALYSIS, ROUTINE W REFLEX MICROSCOPIC - Abnormal; Notable for the following components:  ? APPearance TURBID (*)   ? Hgb urine dipstick MODERATE (*)   ? Protein, ur 100 (*)   ? Nitrite POSITIVE (*)   ? Leukocytes,Ua LARGE (*)   ? RBC / HPF >50 (*)   ? WBC, UA >50 (*)   ? Bacteria, UA FEW (*)   ? All other components within normal limits  ?CULTURE, BLOOD (ROUTINE X 2)  ?CULTURE, BLOOD (ROUTINE X 2)  ?RESP PANEL BY RT-PCR (FLU A&B, COVID) [id number]  ?URINE CULTURE  ?LACTIC ACID, PLASMA  ?LACTIC ACID, PLASMA  ?PROTIME-INR  ?APTT  ? ? ?EKG ?None ? ?Radiology ?CT Head Wo Contrast ? ?Result Date: 06/18/2021 ?CLINICAL DATA:  Dizziness. EXAM: CT HEAD WITHOUT CONTRAST TECHNIQUE: Contiguous axial images were obtained from the base of the skull through the vertex without intravenous contrast. RADIATION DOSE REDUCTION: This exam was performed according to the departmental  dose-optimization program which includes automated exposure control, adjustment of the mA and/or kV according to patient size and/or use of iterative reconstruction technique. COMPARISON:  04/18/2021 and older exa

## 2021-06-18 NOTE — Assessment & Plan Note (Addendum)
UA >50 WBC ?Continue empiric ceftriaxone pending culture data>>GNR ?Ecoli more recently ?Continue cefdinir at d/c x 5 more days ?

## 2021-06-18 NOTE — Assessment & Plan Note (Signed)
Continue statin. 

## 2021-06-18 NOTE — Assessment & Plan Note (Signed)
Stable on room air ?Continue bronchodilators ?

## 2021-06-18 NOTE — ED Notes (Signed)
Pt to CT

## 2021-06-18 NOTE — ED Triage Notes (Signed)
Pt brought in by [facility] from [facility] with c/o AMS since yesterday. Pt recently treated for UTI. EMS reports foul smelling urine. Pt also c/o lower abdominal pain.  ?

## 2021-06-18 NOTE — Assessment & Plan Note (Addendum)
02/02/2021 hemoglobin A1c 6.5 ?3/27  A1c--7.0 ?Start reduced dose Semglee ?NovoLog sliding scale ?Holding metformin--will not restart due to progression of CKD ?

## 2021-06-18 NOTE — H&P (Signed)
?History and Physical  ? ? ?Patient: Douglas Cook [id number] DOB: 12-26-1945 ?DOA: 06/18/2021 ?DOS: the patient was seen and examined on 06/18/2021 ?PCP: Glenda Chroman, MD  ?Patient coming from: ALF/ILF ? ?Chief Complaint:  ?Chief Complaint  ?Patient presents with  ? Altered Mental Status  ? ?HPI: Douglas Cook is a 78 old male with a history of cognitive impairment, CKD stage III, diabetes mellitus type 2, hyperlipidemia, hypertension, depression presenting from [facility] with generalized weakness and confusion of 1 day duration.  The patient was recently admitted to the hospital from 05/09/2021 to 05/11/2021 for sepsis secondary to UTI.  Urine cultures grew E. coli.  The patient was treated with ceftriaxone and discharged home with cefdinir.  The patient is a poor historian secondary to his cognitive impairment.  Apparently, the patient had much difficulty getting out of bed on the morning of 06/18/2021.  As result, patient was brought to emergency department for further evaluation.  At baseline, patient is able to ambulate a few steps with a walker, but he mostly gets around in a wheelchair.  He does need assistance with bathing and clothing. ?There have been no reports of fevers, headache, chest pain, short of breath, coughing, hemoptysis, nausea, vomiting, diarrhea.  The patient himself does complain of some dysuria and suprapubic pain. ?In the ED, the patient was afebrile hemodynamically stable with oxygen saturation of 98% on room air.  WBC 11.6, hemoglobin 1.0, platelets 195,000.  Sodium 139, potassium 4.0, bicarbonate 29, serum creatinine 1.74.  LFTs were unremarkable.  CT of the brain was negative for any acute findings.  UA showed >50 WBC.  Patient was started on IV fluids and ceftriaxone.  Notably, Foley catheter was placed in the emergency department with return of 600 cc of urine.  He was admitted for further evaluation and treatment. ? ?Review of Systems: As mentioned in the history of present  illness. All other systems reviewed and are negative. ?Past Medical History:  ?Diagnosis Date  ? 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 Arthritis   ? CKD (chronic kidney disease), stage III ([place])   ? COPD (chronic obstructive pulmonary disease) ([place])   ? Depression   ? Diabetes mellitus without complication ([place])   ? Fatigue   ? GERD (gastroesophageal reflux disease)   ? Hyperlipidemia   ? Osteoarthritis   ? Reflux   ? ?Past Surgical History:  ?Procedure Laterality Date  ? HIP ARTHROPLASTY Left 02/03/2021  ? Procedure: ARTHROPLASTY BIPOLAR HIP (HEMIARTHROPLASTY);  Surgeon: Carole Civil, MD;  Location: [facility];  Service: Orthopedics;  Laterality: Left;  ? ?Social History:  reports that he has quit smoking. His smoking use included pipe. He has never used smokeless tobacco. He reports that he does not drink alcohol and does not use drugs. ? ?No Known Allergies ? ?Family History  ?Problem Relation Age of Onset  ? Stroke Mother   ? Diabetes Father   ? ? ?Prior to Admission medications   ?Medication Sig Start Date End Date Taking? Authorizing Provider  ?acetaminophen (TYLENOL) 325 MG tablet Take 2 tablets (650 mg total) by mouth every 6 (six) hours as needed for mild pain (or Fever >/= 101). 03/22/21  Yes Gerlene Fee, NP  ?albuterol (VENTOLIN HFA) 108 (90 Base) MCG/ACT inhaler Inhale 1-2 puffs into the lungs every 4 (four) hours as needed for wheezing or shortness of breath.   Yes [provider]  ?alendronate (FOSAMAX) 70 MG tablet Take 70  mg by mouth once a week. 04/12/21  Yes [provider]  ?Alpha-Lipoic Acid 300 MG CAPS Take 1 capsule (300 mg total) by mouth daily at 12 noon. At 10 pm 03/22/21  Yes Gerlene Fee, NP  ?aspirin EC 81 MG tablet Take 1 tablet (81 mg total) by mouth daily. Swallow whole. 03/22/21  Yes Gerlene Fee, NP  ?atorvastatin (LIPITOR) 20 MG tablet Take 1 tablet (20 mg total) by mouth daily. 03/22/21  Yes Gerlene Fee, NP  ?cetirizine  (ZYRTEC) 10 MG tablet Take 1 tablet (10 mg total) by mouth at bedtime. 03/22/21  Yes Gerlene Fee, NP  ?cholecalciferol (VITAMIN D) 25 MCG (1000 UNIT) tablet Take 1 tablet (1,000 Units total) by mouth daily. 03/22/21  Yes Gerlene Fee, NP  ?citalopram (CELEXA) 20 MG tablet Take 1 tablet (20 mg total) by mouth at bedtime. 03/22/21  Yes Gerlene Fee, NP  ?feeding supplement, GLUCERNA SHAKE, (GLUCERNA SHAKE) LIQD Take 237 mLs by mouth daily. 03/22/21  Yes Gerlene Fee, NP  ?FEROSUL 325 (65 Fe) MG tablet Take 1 tablet (325 mg total) by mouth 2 (two) times daily. 03/22/21  Yes Gerlene Fee, NP  ?fluticasone (FLONASE) 50 MCG/ACT nasal spray Place 1 spray into both nostrils daily. 03/22/21  Yes Gerlene Fee, NP  ?Ipratropium-Albuterol (COMBIVENT RESPIMAT) 20-100 MCG/ACT AERS respimat Inhale 2 puffs into the lungs 2 (two) times daily. 03/22/21  Yes Gerlene Fee, NP  ?lamoTRIgine (LAMICTAL) 100 MG tablet Take 1 tablet (100 mg total) by mouth daily. 03/22/21  Yes Gerlene Fee, NP  ?LEVEMIR FLEXTOUCH 100 UNIT/ML FlexTouch Pen Inject 30 Units into the skin at bedtime. 03/22/21  Yes Gerlene Fee, NP  ?lisinopril (ZESTRIL) 2.5 MG tablet Take 1 tablet (2.5 mg total) by mouth daily. 03/22/21  Yes Gerlene Fee, NP  ?metFORMIN (GLUCOPHAGE-XR) 500 MG 24 hr tablet Take 2 tablets (1,000 mg total) by mouth daily with breakfast. 03/22/21  Yes Gerlene Fee, NP  ?NOVOLOG FLEXPEN 100 UNIT/ML FlexPen Inject 8 Units into the skin 3 (three) times daily. 03/22/21  Yes [provider]  ?omeprazole (PRILOSEC) 20 MG capsule Take 1 capsule (20 mg total) by mouth daily. 03/22/21  Yes Gerlene Fee, NP  ?Propylene Glycol (SYSTANE BALANCE) 0.6 % SOLN Apply 1 drop to eye 2 (two) times daily. Both eyes 03/22/21  Yes Gerlene Fee, NP  ?risperiDONE (RISPERDAL) 0.5 MG tablet Take 1 tablet (0.5 mg total) by mouth at bedtime. 03/22/21  Yes Gerlene Fee, NP  ?senna-docusate (SENOKOT-S) 8.6-50 MG  tablet Take 1 tablet by mouth at bedtime as needed for mild constipation. 03/22/21  Yes Gerlene Fee, NP  ?tamsulosin (FLOMAX) 0.4 MG CAPS capsule Take 1 capsule (0.4 mg total) by mouth daily. 03/22/21  Yes Gerlene Fee, NP  ?TRADJENTA 5 MG TABS tablet Take 1 tablet (5 mg total) by mouth daily. 03/22/21  Yes Gerlene Fee, NP  ?traZODone (DESYREL) 100 MG tablet Take 1 tablet (100 mg total) by mouth at bedtime. 03/22/21  Yes Gerlene Fee, NP  ? ? ?Physical Exam: ?Vitals:  ? 06/18/21 1145 06/18/21 1215 06/18/21 1245 06/18/21 1315  ?BP:  (!) 114/54 (!) 141/70 (!) 119/54  ?Pulse: 95 89 93 91  ?Resp: 17 16 13 18   ?Temp:      ?TempSrc:      ?SpO2: 97% 96% 99% 98%  ?Weight:      ?Height:      ? ?GENERAL:  A&O x 1, NAD, well developed, cooperative, follows commands ?HEENT: [place]/AT, No thrush, No icterus, No oral ulcers ?Neck:  No neck mass, No meningismus, soft, supple ?CV: RRR, no S3, no S4, no rub, no JVD ?Lungs:  CTA, no wheeze, no rhonchi, good air movement ?Abd: soft/NT +BS, nondistended ?Ext: No edema, no lymphangitis, no cyanosis, no rashes ?Neuro:  CN II-XII intact, strength 4/5 in RUE, RLE, strength 4/5 LUE, LLE; sensation intact bilateral; no dysmetria; babinski equivocal ? ?Data Reviewed: ?Data reviewed in history ? ?Assessment and Plan: ?* Acute metabolic encephalopathy ?At baseline, patient is alert and oriented x2, requires assistance with some of his ADLs particularly bathing and clothing; essentially wheelchair-bound but able to take a few steps with a walker for PT ?Secondary to UTI ?Continue empiric ceftriaxone pending culture data ? ?Major neurocognitive disorder ([place]) ?Continue Lamictal, Risperdal, and trazodone ? ?UTI (urinary tract infection) ?UA >50 WBC ?Continue empiric ceftriaxone pending culture data ? ?HTN (hypertension) ?Holding lisinopril in the setting of AKI ? ?Depression ?Continue Celexa, Lamictal, Risperdal ? ?HLD (hyperlipidemia) ?Continue statin ? ?Controlled type 2 diabetes  mellitus without complication, without long-term current use of insulin ([place]) ?02/02/2021 hemoglobin A1c 6.5 ?Repeat A1c ?Start reduced dose Semglee ?NovoLog sliding scale ?Holding metformin ? ?COPD (chro

## 2021-06-18 NOTE — Assessment & Plan Note (Addendum)
At baseline, patient is alert and oriented x2, requires assistance with some of his ADLs particularly bathing and clothing; essentially wheelchair-bound but able to take a few steps with a walker for PT ?Secondary to UTI ?Continued empiric ceftriaxone pending culture data ?Mental status at baseline at time of d/c ?

## 2021-06-19 DIAGNOSIS — G9341 Metabolic encephalopathy: Secondary | ICD-10-CM | POA: Diagnosis not present

## 2021-06-19 DIAGNOSIS — N3001 Acute cystitis with hematuria: Secondary | ICD-10-CM | POA: Diagnosis not present

## 2021-06-19 DIAGNOSIS — E119 Type 2 diabetes mellitus without complications: Secondary | ICD-10-CM | POA: Diagnosis not present

## 2021-06-19 DIAGNOSIS — F0390 Unspecified dementia without behavioral disturbance: Secondary | ICD-10-CM

## 2021-06-19 DIAGNOSIS — N179 Acute kidney failure, unspecified: Secondary | ICD-10-CM | POA: Diagnosis not present

## 2021-06-19 LAB — HEMOGLOBIN A1C
Hgb A1c MFr Bld: 7 % — ABNORMAL HIGH (ref 4.8–5.6)
Mean Plasma Glucose: 154.2 mg/dL

## 2021-06-19 LAB — GLUCOSE, CAPILLARY
Glucose-Capillary: 181 mg/dL — ABNORMAL HIGH (ref 70–99)
Glucose-Capillary: 214 mg/dL — ABNORMAL HIGH (ref 70–99)
Glucose-Capillary: 197 mg/dL — ABNORMAL HIGH (ref 70–99)
Glucose-Capillary: 211 mg/dL — ABNORMAL HIGH (ref 70–99)

## 2021-06-19 LAB — CBC
HCT: 29.6 % — ABNORMAL LOW (ref 39.0–52.0)
Hemoglobin: 9.6 g/dL — ABNORMAL LOW (ref 13.0–17.0)
MCH: 30.3 pg (ref 26.0–34.0)
MCHC: 32.4 g/dL (ref 30.0–36.0)
MCV: 93.4 fL (ref 80.0–100.0)
Platelets: 159 10*3/uL (ref 150–400)
RBC: 3.17 MIL/uL — ABNORMAL LOW (ref 4.22–5.81)
RDW: 13.6 % (ref 11.5–15.5)
WBC: 7.3 10*3/uL (ref 4.0–10.5)
nRBC: 0 % (ref 0.0–0.2)

## 2021-06-19 LAB — BASIC METABOLIC PANEL
Anion gap: 4 — ABNORMAL LOW (ref 5–15)
BUN: 25 mg/dL — ABNORMAL HIGH (ref 8–23)
CO2: 28 mmol/L (ref 22–32)
Calcium: 8.5 mg/dL — ABNORMAL LOW (ref 8.9–10.3)
Chloride: 107 mmol/L (ref 98–111)
Creatinine, Ser: 1.48 mg/dL — ABNORMAL HIGH (ref 0.61–1.24)
GFR, Estimated: 50 mL/min — ABNORMAL LOW (ref >60)
Glucose, Bld: 189 mg/dL — ABNORMAL HIGH (ref 70–99)
Potassium: 4.1 mmol/L (ref 3.5–5.1)
Sodium: 139 mmol/L (ref 135–145)

## 2021-06-19 MED ORDER — CHLORHEXIDINE GLUCONATE CLOTH 2 % EX PADS: 6.0000 | MEDICATED_PAD | Freq: Every day | CUTANEOUS | Status: DC

## 2021-06-19 MED ORDER — LACTATED RINGERS IV SOLN: INTRAVENOUS | Status: DC

## 2021-06-19 MED ORDER — ALBUTEROL SULFATE (2.5 MG/3ML) 0.083% IN NEBU: 2.5000 mg | INHALATION_SOLUTION | RESPIRATORY_TRACT | Status: DC | PRN

## 2021-06-19 NOTE — Plan of Care (Signed)
?  Problem: Nutrition: ?Goal: Adequate nutrition will be maintained ?06/19/2021 0433 by Beaulah Dinning, RN ?Outcome: Progressing ?06/19/2021 0433 by Beaulah Dinning, RN ?Outcome: Progressing ?  ?Problem: Coping: ?Goal: Level of anxiety will decrease ?06/19/2021 0433 by Beaulah Dinning, RN ?Outcome: Progressing ?06/19/2021 0433 by Beaulah Dinning, RN ?Outcome: Progressing ?  ?Problem: Elimination: ?Goal: Will not experience complications related to bowel motility ?06/19/2021 0433 by Beaulah Dinning, RN ?Outcome: Progressing ?06/19/2021 0433 by Beaulah Dinning, RN ?Outcome: Progressing ?Goal: Will not experience complications related to urinary retention ?06/19/2021 0433 by Beaulah Dinning, RN ?Outcome: Progressing ?06/19/2021 0433 by Beaulah Dinning, RN ?Outcome: Progressing ?  ?Problem: Safety: ?Goal: Ability to remain free from injury will improve ?06/19/2021 0433 by Beaulah Dinning, RN ?Outcome: Progressing ?06/19/2021 0433 by Beaulah Dinning, RN ?Outcome: Progressing ?  ?Problem: Skin Integrity: ?Goal: Risk for impaired skin integrity will decrease ?06/19/2021 0433 by Beaulah Dinning, RN ?Outcome: Progressing ?06/19/2021 0433 by Beaulah Dinning, RN ?Outcome: Progressing ?  ?

## 2021-06-19 NOTE — Progress Notes (Signed)
?  ?       ?PROGRESS NOTE ? ?Douglas Cook [id number] DOB: 02-20-1946 DOA: 06/18/2021 ?PCP: Glenda Chroman, MD ? ?Brief History:  ?72 old male with a history of cognitive impairment, CKD stage III, diabetes mellitus type 2, hyperlipidemia, hypertension, depression presenting from [facility] with generalized weakness and confusion of 1 day duration.  The patient was recently admitted to the hospital from 05/09/2021 to 05/11/2021 for sepsis secondary to UTI.  Urine cultures grew E. coli.  The patient was treated with ceftriaxone and discharged home with cefdinir.  The patient is a poor historian secondary to his cognitive impairment.  Apparently, the patient had much difficulty getting out of bed on the morning of 06/18/2021.  As result, patient was brought to emergency department for further evaluation.  At baseline, patient is able to ambulate a few steps with a walker, but he mostly gets around in a wheelchair.  He does need assistance with bathing and clothing. ?There have been no reports of fevers, headache, chest pain, short of breath, coughing, hemoptysis, nausea, vomiting, diarrhea.  The patient himself does complain of some dysuria and suprapubic pain. ?In the ED, the patient was afebrile hemodynamically stable with oxygen saturation of 98% on room air.  WBC 11.6, hemoglobin 1.0, platelets 195,000.  Sodium 139, potassium 4.0, bicarbonate 29, serum creatinine 1.74.  LFTs were unremarkable.  CT of the brain was negative for any acute findings.  UA showed >50 WBC.  Patient was started on IV fluids and ceftriaxone.  Notably, Foley catheter was placed in the emergency department with return of 600 cc of urine.  He was admitted for further evaluation and treatment. ? ?3/27:  more alert and conversant.  Foley removed.  Still waiting for UO after foley out.  Continue IVF.  ? ? ?Assessment and Plan: ?* Acute metabolic encephalopathy ?At baseline, patient is alert and oriented x2, requires assistance with some of his  ADLs particularly bathing and clothing; essentially wheelchair-bound but able to take a few steps with a walker for PT ?Secondary to UTI ?Continue empiric ceftriaxone pending culture data ? ?Major neurocognitive disorder ([place]) ?Continue Lamictal, Risperdal, and trazodone ? ?UTI (urinary tract infection) ?UA >50 WBC ?Continue empiric ceftriaxone pending culture data ? ?HTN (hypertension) ?Holding lisinopril in the setting of AKI ? ?Depression ?Continue Celexa, Lamictal, Risperdal ? ?HLD (hyperlipidemia) ?Continue statin ? ?Controlled type 2 diabetes mellitus without complication, without long-term current use of insulin ([place]) ?02/02/2021 hemoglobin A1c 6.5 ?3/27  A1c--7.0 ?Start reduced dose Semglee ?NovoLog sliding scale ?Holding metformin ? ?COPD (chronic obstructive pulmonary disease) ([place]) ?Stable on room air ?Continue bronchodilators ? ?Acute renal failure superimposed on stage 3a chronic kidney disease ([place]) ?Secondary to volume depletion in the setting of lisinopril ?Baseline creatinine 1.2-1.3 ?Presented with serum creatinine 1.74 ?Continue IV fluids>.improving ? ? ? ? ? ? ? ? ?Status is: Inpatient ?Remains inpatient appropriate because: altered mental status and requirement for IVF and IV abx ? ? ? ?Family Communication:  no Family at bedside ? ?Consultants:  none ? ?Code Status:  FULL  ? ?DVT Prophylaxis:  [place] Lovenox ? ? ?Procedures: ?As Listed in Progress Note Above ? ?Antibiotics: ?Ceftriaxone 3/26>> ? ? ? ? ?Subjective: ?Patient denies fevers, chills, headache, chest pain, dyspnea, nausea, vomiting, diarrhea, abdominal pain, dysuria, hematuria,  ? ? ?Objective: ?Vitals:  ? 06/19/21 0448 06/19/21 0448 06/19/21 0839 06/19/21 1404  ?BP: (!) 114/54 (!) 114/54  (!) 116/56  ?Pulse: 99 98  95  ?Resp: 18 18  18  ?Temp: 98.5 ?F (36.9 ?C) 98.5 ?F (36.9 ?C)  (!) 100.6 ?F (38.1 ?C)  ?TempSrc: Oral Oral  Oral  ?SpO2: 93% 93% 92% 98%  ?Weight:      ?Height:      ? ? ?Intake/Output Summary (Last 24 hours) at 06/19/2021  1751 ?Last data filed at 06/19/2021 1735 ?Gross per 24 hour  ?Intake 2504.81 ml  ?Output 1700 ml  ?Net 804.81 ml  ? ?Weight change:  ?Exam: ? ?General:  Pt is alert, follows commands appropriately, not in acute distress ?HEENT: No icterus, No thrush, No neck mass, [place]/AT ?Cardiovascular: RRR, S1/S2, no rubs, no gallops ?Respiratory: fine bibasilar crackles. No wheeze ?Abdomen: Soft/+BS, non tender, non distended, no guarding ?Extremities: No edema, No lymphangitis, No petechiae, No rashes, no synovitis ? ? ?Data Reviewed: ?I have personally reviewed following labs and imaging studies ?Basic Metabolic Panel: ?Recent Labs  ?Lab 06/18/21 ?0825 06/19/21 ?[id number]  ?NA 139 139  ?K 4.0 4.1  ?CL 102 107  ?CO2 29 28  ?GLUCOSE 187* 189*  ?BUN 29* 25*  ?CREATININE 1.74* 1.48*  ?CALCIUM 9.4 8.5*  ? ?Liver Function Tests: ?Recent Labs  ?Lab 06/18/21 ?0825  ?AST 12*  ?ALT 12  ?ALKPHOS 37*  ?BILITOT 0.8  ?PROT 6.6  ?ALBUMIN 3.4*  ? ?No results for input(s): LIPASE, AMYLASE in the last 168 hours. ?No results for input(s): AMMONIA in the last 168 hours. ?Coagulation Profile: ?Recent Labs  ?Lab 06/18/21 ?0825  ?INR 1.1  ? ?CBC: ?Recent Labs  ?Lab 06/18/21 ?0825 06/19/21 ?[id number]  ?WBC 11.6* 7.3  ?NEUTROABS 9.0*  --   ?HGB 11.0* 9.6*  ?HCT 34.3* 29.6*  ?MCV 93.7 93.4  ?PLT 195 159  ? ?Cardiac Enzymes: ?No results for input(s): CKTOTAL, CKMB, CKMBINDEX, TROPONINI in the last 168 hours. ?BNP: ?Invalid input(s): POCBNP ?CBG: ?Recent Labs  ?Lab 06/18/21 ?1746 06/18/21 ?2326 06/19/21 ?[id number] 06/19/21 ?1112 06/19/21 ?1557  ?GLUCAP 169* 188* 181* 214* 197*  ? ?HbA1C: ?Recent Labs  ?  06/18/21 ?0825 06/19/21 ?[id number]  ?HGBA1C 7.0* 7.0*  ? ?Urine analysis: ?   ?Component Value Date/Time  ? [place] YELLOW 06/18/2021 0915  ? APPEARANCEUR TURBID (A) 06/18/2021 0915  ? LABSPEC 1.016 06/18/2021 0915  ? PHURINE 5.0 06/18/2021 0915  ? GLUCOSEU NEGATIVE 06/18/2021 0915  ? HGBUR MODERATE (A) 06/18/2021 0915  ? [place] NEGATIVE 06/18/2021 0915  ? [place]  NEGATIVE 06/18/2021 0915  ? PROTEINUR 100 (A) 06/18/2021 0915  ? NITRITE POSITIVE (A) 06/18/2021 0915  ? LEUKOCYTESUR LARGE (A) 06/18/2021 0915  ? ?Sepsis Labs: ?@LABRCNTIP (procalcitonin:4,lacticidven:4) ?) ?Recent Results (from the past 240 hour(s))  ?Blood Culture (routine x 2)     Status: None (Preliminary result)  ? Collection Time: 06/18/21  8:25 AM  ? Specimen: Right Antecubital; Blood  ?Result Value Ref Range Status  ? Specimen Description   Final  ?  RIGHT ANTECUBITAL BOTTLES DRAWN AEROBIC AND ANAEROBIC  ? Special Requests Blood Culture adequate volume  Final  ? Culture   Final  ?  NO GROWTH < 24 HOURS ?Performed at [facility], [address], [place], [place] [address] ?  ? Report Status PENDING  Incomplete  ?Blood Culture (routine x 2)     Status: None (Preliminary result)  ? Collection Time: 06/18/21  8:44 AM  ? Specimen: BLOOD LEFT HAND  ?Result Value Ref Range Status  ? Specimen Description   Final  ?  BLOOD LEFT HAND BOTTLES DRAWN AEROBIC AND ANAEROBIC  ? Special Requests Blood Culture adequate volume  Final  ? Culture   Final  ?  NO GROWTH < 24 HOURS ?Performed at [facility], [address] [place], [place] [address] ?  ? Report Status PENDING  Incomplete  ?Resp Panel by RT-PCR (Flu A&B, Covid) Nasopharyngeal Swab     Status: None  ? Collection Time: 06/18/21  9:19 AM  ? Specimen: Nasopharyngeal Swab; Nasopharyngeal(NP) swabs in vial transport medium  ?Result Value Ref Range Status  ? SARS Coronavirus 2 by RT PCR NEGATIVE NEGATIVE Final  ?  Comment: (NOTE) ?SARS-CoV-2 target nucleic acids are NOT DETECTED. ? ?The SARS-CoV-2 RNA is generally detectable in upper respiratory ?specimens during the acute phase of infection. The lowest ?concentration of SARS-CoV-2 viral copies this assay can detect is ?138 copies/mL. A negative result does not preclude SARS-Cov-2 ?infection and should not be used as the sole basis for treatment or ?other patient management decisions. A negative result may occur with   ?improper specimen collection/handling, submission of specimen other ?than nasopharyngeal swab, presence of viral mutation(s) within the ?areas targeted by this assay, and inadequate number of viral ?copies

## 2021-06-19 NOTE — TOC Initial Note (Signed)
Transition of Care (TOC) - Initial/Assessment Note  ? ? ?Patient Details  ?Name: Douglas Cook ?MRN: [id number] ?Date of Birth: [birth year] ? ?Transition of Care (TOC) CM/SW Contact:    ?Boneta Lucks, RN ?Phone Number: ?06/19/2021, 10:16 AM ? ?Clinical Narrative:    Patient admitted with Acute metabolic encephalopathy, admitted last month and hip fracture requiring SNF last November. Patient is from [facility],  and requires assist with all ADLs.  ?FL2 started, discharge planning for back to ALF tomorrow.  ? ?Expected Discharge Plan: Assisted Living ?Barriers to Discharge: Continued Medical Work up ?Patient Goals and CMS Choice ?Patient states their goals for this hospitalization and ongoing recovery are:: to return to ALF ?CMS Medicare.gov Compare Post Acute Care list provided to:: Patient Represenative (must comment) ?Choice offered to / list presented to : [facility] POA / Guardian ? ?Expected Discharge Plan and Services ?Expected Discharge Plan: Assisted Living ?  ? Post Acute Care Choice: Home Health ?Living arrangements for the past 2 months: [place] ?                ?   ? ?Prior Living Arrangements/Services ?Living arrangements for the past 2 months: [place] ?Lives with:: Domestic Partner ?  ?       ?Activities of Daily Living ?Home Assistive Devices/Equipment: Wheelchair ?ADL Screening (condition at time of admission) ?Patient's cognitive ability adequate to safely complete daily activities?: No ?Is the patient deaf or have difficulty hearing?: No ?Does the patient have difficulty seeing, even when wearing glasses/contacts?: No ?Does the patient have difficulty concentrating, remembering, or making decisions?: Yes ?Patient able to express need for assistance with ADLs?: Yes ?Does the patient have difficulty dressing or bathing?: No ?Independently performs ADLs?: No ?Does the patient have difficulty walking or climbing stairs?: Yes ?Weakness of Legs: None ?Weakness of Arms/Hands:  None ? ?Permission Sought/Granted ?    ?  ?Orientation: : Oriented to Self ?Alcohol / Substance Use: Not Applicable ?Psych Involvement: No (comment) ? ?Admission diagnosis:  Acute cystitis with hematuria [N30.01] ?Acute metabolic encephalopathy 99991111 ?Patient Active Problem List  ? Diagnosis Date Noted  ? Acute metabolic encephalopathy [id number]  ? Sepsis secondary to UTI ([place]) 05/09/2021  ? Major neurocognitive disorder ([place]) 02/10/2021  ? HTN (hypertension) 02/08/2021  ? BPH without obstruction/lower urinary tract symptoms 02/08/2021  ? Anemia due to stage 3b chronic kidney disease ([place]) 02/08/2021  ? CKD stage 3 due to type 2 diabetes mellitus ([place]) 02/08/2021  ? Chronic constipation 02/08/2021  ? Major depression, recurrent, chronic ([place]) 02/08/2021  ? UTI (urinary tract infection) 02/08/2021  ? Closed fracture of neck of left femur ([place])   ? Hip fracture ([place]) 02/01/2021  ? Acute renal failure superimposed on stage 3a chronic kidney disease ([place]) 07/04/2017  ? COPD (chronic obstructive pulmonary disease) ([place]) 07/04/2017  ? Controlled type 2 diabetes mellitus without complication, without long-term current use of insulin ([place]) 07/04/2017  ? GERD (gastroesophageal reflux disease) 07/04/2017  ? HLD (hyperlipidemia) 07/04/2017  ? Depression 07/04/2017  ? ?PCP:  Glenda Chroman, MD ?Pharmacy:   ?[facility] ?[place] [address] ?Phone: [phone] Fax: [phone] ? ? ?Readmission Risk Interventions ? ?  06/19/2021  ? 10:14 AM 05/10/2021  ?  9:38 AM  ?Readmission Risk Prevention Plan  ?Transportation Screening Complete Complete  ?[place] or Home Care Consult  Complete  ?Social Work Consult for [place] Planning/Counseling  Complete  ?Palliative Care Screening  Not Applicable  ?  Medication Review Press photographer) Complete Complete  ?[place] or Home Care Consult Complete   ?SW Recovery Care/Counseling Consult Complete   ?Palliative Care Screening Not Applicable    ?[place] Not Applicable   ? ? ? ?

## 2021-06-19 NOTE — Progress Notes (Signed)
Patient foley removed this shift, noted no urine output at this time. Bladder scanned patient noted 30 ml in bladder. MD Tat made aware.  ?

## 2021-06-20 DIAGNOSIS — N3001 Acute cystitis with hematuria: Secondary | ICD-10-CM | POA: Diagnosis not present

## 2021-06-20 DIAGNOSIS — N179 Acute kidney failure, unspecified: Secondary | ICD-10-CM | POA: Diagnosis not present

## 2021-06-20 DIAGNOSIS — F0390 Unspecified dementia without behavioral disturbance: Secondary | ICD-10-CM | POA: Diagnosis not present

## 2021-06-20 DIAGNOSIS — G9341 Metabolic encephalopathy: Secondary | ICD-10-CM | POA: Diagnosis not present

## 2021-06-20 LAB — BASIC METABOLIC PANEL
Anion gap: 5 (ref 5–15)
BUN: 30 mg/dL — ABNORMAL HIGH (ref 8–23)
CO2: 27 mmol/L (ref 22–32)
Calcium: 8.6 mg/dL — ABNORMAL LOW (ref 8.9–10.3)
Chloride: 105 mmol/L (ref 98–111)
Creatinine, Ser: 1.61 mg/dL — ABNORMAL HIGH (ref 0.61–1.24)
GFR, Estimated: 45 mL/min — ABNORMAL LOW (ref >60)
Glucose, Bld: 194 mg/dL — ABNORMAL HIGH (ref 70–99)
Potassium: 4 mmol/L (ref 3.5–5.1)
Sodium: 137 mmol/L (ref 135–145)

## 2021-06-20 LAB — GLUCOSE, CAPILLARY
Glucose-Capillary: 183 mg/dL — ABNORMAL HIGH (ref 70–99)
Glucose-Capillary: 313 mg/dL — ABNORMAL HIGH (ref 70–99)

## 2021-06-20 LAB — MAGNESIUM: Magnesium: 1.9 mg/dL (ref 1.7–2.4)

## 2021-06-20 MED ORDER — CEFDINIR 300 MG PO CAPS: 300.0000 mg | ORAL_CAPSULE | Freq: Two times a day (BID) | ORAL | 0 refills | Status: DC

## 2021-06-20 MED ORDER — CEFDINIR 300 MG PO CAPS
300.0000 mg | ORAL_CAPSULE | Freq: Two times a day (BID) | ORAL | Status: DC
  Administered 2021-06-20: 300 mg via ORAL
  Filled 2021-06-20: qty 1

## 2021-06-20 NOTE — Discharge Summary (Addendum)
Physician Discharge Summary   Patient: Douglas Cook MRN: [id number] DOB: Jul 07, 1945  Admit date:     06/18/2021  Discharge date: 06/20/21  Discharge Physician: Onalee Hua Leverett Camplin   PCP: Ignatius Specking, MD   Recommendations at discharge:   Please follow up with primary care provider within 1-2 weeks  Please repeat BMP in one week      Hospital Course: 89 old male with a history of cognitive impairment, CKD stage III, diabetes mellitus type 2, hyperlipidemia, hypertension, depression presenting from [facility] with generalized weakness and confusion of 1 day duration.  The patient was recently admitted to the hospital from 05/09/2021 to 05/11/2021 for sepsis secondary to UTI.  Urine cultures grew E. coli.  The patient was treated with ceftriaxone and discharged home with cefdinir.  The patient is a poor historian secondary to his cognitive impairment.  Apparently, the patient had much difficulty getting out of bed on the morning of 06/18/2021.  As result, patient was brought to emergency department for further evaluation.  At baseline, patient is able to ambulate a few steps with a walker, but he mostly gets around in a wheelchair.  He does need assistance with bathing and clothing. There have been no reports of fevers, headache, chest pain, short of breath, coughing, hemoptysis, nausea, vomiting, diarrhea.  The patient himself does complain of some dysuria and suprapubic pain. In the ED, the patient was afebrile hemodynamically stable with oxygen saturation of 98% on room air.  WBC 11.6, hemoglobin 1.0, platelets 195,000.  Sodium 139, potassium 4.0, bicarbonate 29, serum creatinine 1.74.  LFTs were unremarkable.  CT of the brain was negative for any acute findings.  UA showed >50 WBC.  Patient was started on IV fluids and ceftriaxone.  Notably, Foley catheter was placed in the emergency department with return of 600 cc of urine.  He was admitted for further evaluation and treatment.  3/27:  more alert and  conversant.  Foley removed.  Still waiting for UO after foley out.  Continue IVF.  3/28:  mental status overall improved, stable.  A&O x 2 which is his usual baseline.  D/C back to [facility] with cefdinir x 5 more days.  Also set up HHPT.  Final culture susceptibility not back but prelim was Ecoli.  Pt with Ecoli on month ago sensitive to cefdinir.  Able to urinate spontaneously without foley.  Assessment and Plan: * Acute metabolic encephalopathy At baseline, patient is alert and oriented x2, requires assistance with some of his ADLs particularly bathing and clothing; essentially wheelchair-bound but able to take a few steps with a walker for PT Secondary to UTI Continued empiric ceftriaxone pending culture data Mental status at baseline at time of d/c  Major neurocognitive disorder (HCC) Continue Lamictal, Risperdal, and trazodone A&O x 2 at baseline  UTI (urinary tract infection) UA >50 WBC Continue empiric ceftriaxone pending culture data>>GNR Ecoli more recently Continue cefdinir at d/c x 5 more days  HTN (hypertension) Holding lisinopril in the setting of progression of CKD BP remains controlled   Depression Continue Celexa, Lamictal, Risperdal  HLD (hyperlipidemia) Continue statin  Controlled type 2 diabetes mellitus without complication, without long-term current use of insulin (HCC) 02/02/2021 hemoglobin A1c 6.5 3/27  A1c--7.0 Start reduced dose Semglee NovoLog sliding scale Holding metformin--will not restart due to progression of CKD  COPD (chronic obstructive pulmonary disease) (HCC) Stable on room air Continue bronchodilators  Acute renal failure superimposed on stage 3a chronic kidney disease (HCC) Secondary to volume  depletion in the setting of lisinopril Baseline creatinine 1.2-1.3 previously Presented with serum creatinine 1.74 Continue IV fluids>.improving Pt likely has progression of his CKD with new baseline 1.3-1.5         Consultants:  none Procedures performed: none  Disposition: Assisted living Diet recommendation:  Carb modified diet DISCHARGE MEDICATION: Allergies as of 06/20/2021   No Known Allergies      Medication List     STOP taking these medications    alendronate 70 MG tablet Commonly known as: FOSAMAX   lisinopril 2.5 MG tablet Commonly known as: ZESTRIL       TAKE these medications    acetaminophen 325 MG tablet Commonly known as: TYLENOL Take 2 tablets (650 mg total) by mouth every 6 (six) hours as needed for mild pain (or Fever >/= 101).   albuterol 108 (90 Base) MCG/ACT inhaler Commonly known as: VENTOLIN HFA Inhale 1-2 puffs into the lungs every 4 (four) hours as needed for wheezing or shortness of breath.   Alpha-Lipoic Acid 300 MG Caps Take 1 capsule (300 mg total) by mouth daily at 12 noon. At 10 pm   aspirin EC 81 MG tablet Take 1 tablet (81 mg total) by mouth daily. Swallow whole.   atorvastatin 20 MG tablet Commonly known as: LIPITOR Take 1 tablet (20 mg total) by mouth daily.   cefdinir 300 MG capsule Commonly known as: OMNICEF Take 1 capsule (300 mg total) by mouth every 12 (twelve) hours.   cetirizine 10 MG tablet Commonly known as: ZYRTEC Take 1 tablet (10 mg total) by mouth at bedtime.   cholecalciferol 25 MCG (1000 UNIT) tablet Commonly known as: VITAMIN D Take 1 tablet (1,000 Units total) by mouth daily.   citalopram 20 MG tablet Commonly known as: CELEXA Take 1 tablet (20 mg total) by mouth at bedtime.   Combivent Respimat 20-100 MCG/ACT Aers respimat Generic drug: Ipratropium-Albuterol Inhale 2 puffs into the lungs 2 (two) times daily.   feeding supplement (GLUCERNA SHAKE) Liqd Take 237 mLs by mouth daily.   FeroSul 325 (65 FE) MG tablet Generic drug: ferrous sulfate Take 1 tablet (325 mg total) by mouth 2 (two) times daily.   fluticasone 50 MCG/ACT nasal spray Commonly known as: FLONASE Place 1 spray into both nostrils daily.   lamoTRIgine  100 MG tablet Commonly known as: LAMICTAL Take 1 tablet (100 mg total) by mouth daily.   Levemir FlexTouch 100 UNIT/ML FlexPen Generic drug: insulin detemir Inject 30 Units into the skin at bedtime.   metFORMIN 500 MG 24 hr tablet Commonly known as: GLUCOPHAGE-XR Take 2 tablets (1,000 mg total) by mouth daily with breakfast.   NovoLOG FlexPen 100 UNIT/ML FlexPen Generic drug: insulin aspart Inject 8 Units into the skin 3 (three) times daily.   omeprazole 20 MG capsule Commonly known as: PRILOSEC Take 1 capsule (20 mg total) by mouth daily.   risperiDONE 0.5 MG tablet Commonly known as: RISPERDAL Take 1 tablet (0.5 mg total) by mouth at bedtime.   senna-docusate 8.6-50 MG tablet Commonly known as: Senokot-S Take 1 tablet by mouth at bedtime as needed for mild constipation.   Systane Balance 0.6 % Soln Generic drug: Propylene Glycol Apply 1 drop to eye 2 (two) times daily. Both eyes   tamsulosin 0.4 MG Caps capsule Commonly known as: FLOMAX Take 1 capsule (0.4 mg total) by mouth daily.   Tradjenta 5 MG Tabs tablet Generic drug: linagliptin Take 1 tablet (5 mg total) by mouth daily.   traZODone 100  MG tablet Commonly known as: DESYREL Take 1 tablet (100 mg total) by mouth at bedtime.        Discharge Exam: Filed Weights   06/18/21 0757  Weight: 89.4 kg   HEENT:  [place]/AT, No thrush, no icterus CV:  RRR, no rub, no S3, no S4 Lung:  CTA, no wheeze, no rhonchi Abd:  soft/+BS, NT Ext:  No edema, no lymphangitis, no synovitis, no rash   Condition at discharge: stable  The results of significant diagnostics from this hospitalization (including imaging, microbiology, ancillary and laboratory) are listed below for reference.   Imaging Studies: CT Head Wo Contrast  Result Date: 06/18/2021 CLINICAL DATA:  Dizziness. EXAM: CT HEAD WITHOUT CONTRAST TECHNIQUE: Contiguous axial images were obtained from the base of the skull through the vertex without intravenous  contrast. RADIATION DOSE REDUCTION: This exam was performed according to the departmental dose-optimization program which includes automated exposure control, adjustment of the mA and/or kV according to patient size and/or use of iterative reconstruction technique. COMPARISON:  04/18/2021 and older exams. FINDINGS: Brain: No evidence of acute infarction, hemorrhage, hydrocephalus, extra-axial collection or mass lesion/mass effect. Ventricular enlargement consistent with mild diffuse atrophy. Patchy white matter hypoattenuation noted bilaterally consistent with mild chronic microvascular ischemic change. These findings are stable. Vascular: No hyperdense vessel or unexpected calcification. Skull: Normal. Negative for fracture or focal lesion. Sinuses/Orbits: Globes and orbits are unremarkable. Sinuses are clear. Other: None. IMPRESSION: 1. No acute intracranial abnormalities. 2. Mild atrophy and chronic microvascular ischemic change. Stable appearance from the most recent prior study. Electronically Signed   By: Amie Portland M.D.   On: 06/18/2021 10:02   DG Chest Port 1 View  Result Date: 06/18/2021 CLINICAL DATA:  Question ule sepsis. EXAM: PORTABLE CHEST 1 VIEW COMPARISON:  05/09/2021. FINDINGS: Cardiac silhouette is normal in size and configuration. Normal mediastinal and hilar contours. Clear lungs.  No pleural effusion or pneumothorax. Skeletal structures are grossly intact. IMPRESSION: No active disease. Electronically Signed   By: Amie Portland M.D.   On: 06/18/2021 10:03    Microbiology: Results for orders placed or performed during the hospital encounter of 06/18/21  Blood Culture (routine x 2)     Status: None (Preliminary result)   Collection Time: 06/18/21  8:25 AM   Specimen: Right Antecubital; Blood  Result Value Ref Range Status   Specimen Description   Final    RIGHT ANTECUBITAL BOTTLES DRAWN AEROBIC AND ANAEROBIC   Special Requests Blood Culture adequate volume  Final   Culture    Final    NO GROWTH 2 DAYS Performed at [facility], [address], [place], [place] [address]    Report Status PENDING  Incomplete  Blood Culture (routine x 2)     Status: None (Preliminary result)   Collection Time: 06/18/21  8:44 AM   Specimen: BLOOD LEFT HAND  Result Value Ref Range Status   Specimen Description   Final    BLOOD LEFT HAND BOTTLES DRAWN AEROBIC AND ANAEROBIC   Special Requests Blood Culture adequate volume  Final   Culture   Final    NO GROWTH 2 DAYS Performed at [facility], [address], [place], [place] [address]    Report Status PENDING  Incomplete  Urine Culture     Status: Abnormal (Preliminary result)   Collection Time: 06/18/21  9:19 AM   Specimen: In/Out Cath Urine  Result Value Ref Range Status   Specimen Description   Final    IN/OUT CATH URINE Performed  at [facility], [address] [place], [place] [address]    Special Requests   Final    NONE Performed at [facility], [address] [place], [place] [address]    Culture (A)  Final    >=100,000 COLONIES/mL GRAM NEGATIVE RODS IDENTIFICATION AND SUSCEPTIBILITIES TO FOLLOW Performed at [facility], [address], [place], [place] [address]    Report Status PENDING  Incomplete  Resp Panel by RT-PCR (Flu A&B, Covid) Nasopharyngeal Swab     Status: None   Collection Time: 06/18/21  9:19 AM   Specimen: Nasopharyngeal Swab; Nasopharyngeal(NP) swabs in vial transport medium  Result Value Ref Range Status   SARS Coronavirus 2 by RT PCR NEGATIVE NEGATIVE Final    Comment: (NOTE) SARS-CoV-2 target nucleic acids are NOT DETECTED.  The SARS-CoV-2 RNA is generally detectable in upper respiratory specimens during the acute phase of infection. The lowest concentration of SARS-CoV-2 viral copies this assay can detect is 138 copies/mL. A negative result does not preclude SARS-Cov-2 infection and should not be used as the sole basis for treatment or other patient management decisions. A  negative result may occur with  improper specimen collection/handling, submission of specimen other than nasopharyngeal swab, presence of viral mutation(s) within the areas targeted by this assay, and inadequate number of viral copies(<138 copies/mL). A negative result must be combined with clinical observations, patient history, and epidemiological information. The expected result is Negative.  Fact Sheet for Patients:  BloggerCourse.com  Fact Sheet for Healthcare Providers:  SeriousBroker.it  This test is no t yet approved or cleared by the [place] FDA and  has been authorized for detection and/or diagnosis of SARS-CoV-2 by FDA under an Emergency Use Authorization (EUA). This EUA will remain  in effect (meaning this test can be used) for the duration of the COVID-19 declaration under Section 564(b)(1) of the Act, 21 U.S.C.section 360bbb-3(b)(1), unless the authorization is terminated  or revoked sooner.       Influenza A by PCR NEGATIVE NEGATIVE Final   Influenza B by PCR NEGATIVE NEGATIVE Final    Comment: (NOTE) The Xpert Xpress SARS-CoV-2/FLU/RSV plus assay is intended as an aid in the diagnosis of influenza from Nasopharyngeal swab specimens and should not be used as a sole basis for treatment. Nasal washings and aspirates are unacceptable for Xpert Xpress SARS-CoV-2/FLU/RSV testing.  Fact Sheet for Patients: BloggerCourse.com  Fact Sheet for Healthcare Providers: SeriousBroker.it  This test is not yet approved or cleared by the [place] FDA and has been authorized for detection and/or diagnosis of SARS-CoV-2 by FDA under an Emergency Use Authorization (EUA). This EUA will remain in effect (meaning this test can be used) for the duration of the COVID-19 declaration under Section 564(b)(1) of the Act, 21 U.S.C. section 360bbb-3(b)(1), unless the authorization  is terminated or revoked.  Performed at [facility], [address] [place], [place] [address]     Labs: CBC: Recent Labs  Lab 06/18/21 0825 06/19/21 0605  WBC 11.6* 7.3  NEUTROABS 9.0*  --   HGB 11.0* 9.6*  HCT 34.3* 29.6*  MCV 93.7 93.4  PLT 195 159   Basic Metabolic Panel: Recent Labs  Lab 06/18/21 0825 06/19/21 0605 06/20/21 0538  NA 139 139 137  K 4.0 4.1 4.0  CL 102 107 105  CO2 29 28 27   GLUCOSE 187* 189* 194*  BUN 29* 25* 30*  CREATININE 1.74* 1.48* 1.61*  CALCIUM 9.4 8.5* 8.6*  MG  --   --  1.9   Liver Function Tests: Recent Labs  Lab 06/18/21 0825  AST 12*  ALT 12  ALKPHOS 37*  BILITOT 0.8  PROT 6.6  ALBUMIN 3.4*   CBG: Recent Labs  Lab 06/19/21 0714 06/19/21 1112 06/19/21 1557 06/19/21 2211 06/20/21 0705  GLUCAP 181* 214* 197* 211* 183*    Discharge time spent: greater than 30 minutes.  Signed: Catarina Hartshorn, MD [facility] 06/20/2021

## 2021-06-20 NOTE — NC FL2 (Signed)
?[place] MEDICAID FL2 LEVEL OF CARE SCREENING TOOL  ?  ? ?IDENTIFICATION  ?Patient Name: ?Douglas Cook Birthdate: 09-03-1945 Sex: male Admission Date (Current Location): ?06/18/2021  ?[place] and [place] Number: ? [place] and Address:  ?[address] [place] ?     Provider Number: ?[id number]  ?Attending Physician Name and Address:  ?Orson Eva, MD ? Relative Name and Phone Number:  ?Coffman,Carolyn (Sister)   [phone] ?   ?Current Level of Care: ?Hospital Recommended Level of Care: ?Assisted Living Facility Prior Approval Number: ?  ? ?Date Approved/Denied: ?  PASRR Number: ?[id number] ? ?Discharge Plan: ?Domiciliary (Rest home) ?  ? ?Current Diagnoses: ?Patient Active Problem List  ? Diagnosis Date Noted  ? Acute metabolic encephalopathy [id number]  ? Sepsis secondary to UTI ([place]) 05/09/2021  ? Major neurocognitive disorder ([place]) 02/10/2021  ? HTN (hypertension) 02/08/2021  ? BPH without obstruction/lower urinary tract symptoms 02/08/2021  ? 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 CKD stage 3 due to type 2 diabetes mellitus ([place]) 02/08/2021  ? Chronic constipation 02/08/2021  ? Major depression, recurrent, chronic ([place]) 02/08/2021  ? UTI (urinary tract infection) 02/08/2021  ? Closed fracture of neck of left femur ([place])   ? Hip fracture ([place]) 02/01/2021  ? Acute renal failure superimposed on stage 3a chronic kidney disease ([place]) 07/04/2017  ? COPD (chronic obstructive pulmonary disease) ([place]) 07/04/2017  ? Controlled type 2 diabetes mellitus without complication, without long-term current use of insulin ([place]) 07/04/2017  ? GERD (gastroesophageal reflux disease) 07/04/2017  ? HLD (hyperlipidemia) 07/04/2017  ? Depression 07/04/2017  ? ? ?Orientation RESPIRATION BLADDER Height & Weight   ?  ?Self ? Normal Continent Weight: 89.4 kg ?Height:  6' (182.9 cm)  ?BEHAVIORAL SYMPTOMS/MOOD NEUROLOGICAL BOWEL NUTRITION STATUS  ?    Continent Diet   ?AMBULATORY STATUS COMMUNICATION OF NEEDS Skin   ?  Verbally Normal ?  ?  ?  ?    ?     ?     ? ? ?Personal Care Assistance Level of Assistance  ?Bathing, Feeding, Dressing Bathing Assistance: Limited assistance ?Feeding assistance: Limited assistance ?Dressing Assistance: Limited assistance ?   ? ?Functional Limitations Info  ?Hearing, Sight, Speech Sight Info: Impaired ?Hearing Info: Impaired ?Speech Info: Adequate  ? ? ?SPECIAL CARE FACTORS FREQUENCY  ?PT (By licensed PT)   ?  ?PT Frequency: 3 times a week ?  ?  ?  ?  ?   ? ? ?Contractures Contractures Info: Not present  ? ? ?Additional Factors Info  ?Code Status, Allergies Code Status Info: Full ?Allergies Info: NKDA ?  ?  ?  ?   ? ?Current Medications (06/20/2021):  This is the current hospital active medication list ?Current Facility-Administered Medications  ?Medication Dose Route Frequency Provider Last Rate Last Admin  ? acetaminophen (TYLENOL) tablet 650 mg  650 mg Oral Q6H PRN Tat, David, MD      ? Or  ? acetaminophen (TYLENOL) suppository 650 mg  650 mg Rectal Q6H PRN Tat, Shanon Brow, MD      ? albuterol (PROVENTIL) (2.5 MG/3ML) 0.083% nebulizer solution 2.5 mg  2.5 mg Nebulization Q4H PRN Tat, David, MD      ? aspirin EC tablet 81 mg  81 mg Oral Daily Tat, Shanon Brow, MD   81 mg at 06/20/21 [id number]  ? atorvastatin (LIPITOR) tablet 20 mg  20 mg Oral Daily Tat, Shanon Brow, MD   20 mg  at 06/20/21 0804  ? cefdinir (OMNICEF) capsule 300 mg  300 mg Oral Q12H Tat, Shanon Brow, MD      ? Chlorhexidine Gluconate Cloth 2 % PADS 6 each  6 each Topical Daily Tat, David, MD      ? cholecalciferol (VITAMIN D3) tablet 1,000 Units  1,000 Units Oral Daily Tat, Shanon Brow, MD   1,000 Units at 06/20/21 0804  ? citalopram (CELEXA) tablet 20 mg  20 mg Oral Benay Pike, MD   20 mg at 06/19/21 2146  ? enoxaparin (LOVENOX) injection 40 mg  40 mg Subcutaneous Q24H Orson Eva, MD   40 mg at 06/19/21 2147  ? feeding supplement (GLUCERNA SHAKE) (GLUCERNA SHAKE) liquid 237 mL  237 mL Oral Daily Tat, David,  MD   237 mL at 06/18/21 1658  ? ferrous sulfate tablet 325 mg  325 mg Oral BID WC Orson Eva, MD   325 mg at 06/20/21 0804  ? fluticasone (FLONASE) 50 MCG/ACT nasal spray 1 spray  1 spray Each Nare Daily Tat, David, MD   1 spray at 06/20/21 0803  ? insulin aspart (novoLOG) injection 0-5 Units  0-5 Units Subcutaneous QHS Tat, David, MD      ? insulin aspart (novoLOG) injection 0-6 Units  0-6 Units Subcutaneous TID WC Orson Eva, MD   1 Units at 06/20/21 0805  ? insulin glargine-yfgn (SEMGLEE) injection 20 Units  20 Units Subcutaneous QHS Orson Eva, MD   20 Units at 06/19/21 2300  ? lactated ringers infusion   Intravenous Continuous Tat, David, MD 75 mL/hr at 06/19/21 1710 Restarted at 06/19/21 1710  ? lamoTRIgine (LAMICTAL) tablet 100 mg  100 mg Oral Daily Tat, Shanon Brow, MD   100 mg at 06/20/21 0804  ? linagliptin (TRADJENTA) tablet 5 mg  5 mg Oral Daily Tat, David, MD   5 mg at 06/20/21 0804  ? loratadine (CLARITIN) tablet 10 mg  10 mg Oral Daily Tat, Shanon Brow, MD   10 mg at 06/20/21 0804  ? ondansetron (ZOFRAN) tablet 4 mg  4 mg Oral Q6H PRN Tat, David, MD      ? Or  ? ondansetron (ZOFRAN) injection 4 mg  4 mg Intravenous Q6H PRN Tat, David, MD      ? pantoprazole (PROTONIX) EC tablet 40 mg  40 mg Oral Daily Tat, David, MD   40 mg at 06/20/21 0804  ? risperiDONE (RISPERDAL) tablet 0.5 mg  0.5 mg Oral Benay Pike, MD   0.5 mg at 06/19/21 2146  ? tamsulosin (FLOMAX) capsule 0.4 mg  0.4 mg Oral Daily Tat, David, MD   0.4 mg at 06/20/21 [id number]  ? ? ? ?Discharge Medications: ? ?STOP taking these medications   ?  ?alendronate 70 MG tablet ?Commonly known as: FOSAMAX ?   ?lisinopril 2.5 MG tablet ?Commonly known as: ZESTRIL ?   ?  ?   ?  ?TAKE these medications   ?  ?acetaminophen 325 MG tablet ?Commonly known as: TYLENOL ?Take 2 tablets (650 mg total) by mouth every 6 (six) hours as needed for mild pain (or Fever >/= 101). ?   ?albuterol 108 (90 Base) MCG/ACT inhaler ?Commonly known as: VENTOLIN HFA ?Inhale 1-2 puffs into  the lungs every 4 (four) hours as needed for wheezing or shortness of breath. ?   ?Alpha-Lipoic Acid 300 MG Caps ?Take 1 capsule (300 mg total) by mouth daily at 12 noon. At 10 pm ?   ?aspirin EC 81 MG tablet ?Take 1 tablet (81 mg  total) by mouth daily. Swallow whole. ?   ?atorvastatin 20 MG tablet ?Commonly known as: LIPITOR ?Take 1 tablet (20 mg total) by mouth daily. ?   ?cefdinir 300 MG capsule ?Commonly known as: OMNICEF ?Take 1 capsule (300 mg total) by mouth every 12 (twelve) hours. ?   ?cetirizine 10 MG tablet ?Commonly known as: ZYRTEC ?Take 1 tablet (10 mg total) by mouth at bedtime. ?   ?cholecalciferol 25 MCG (1000 UNIT) tablet ?Commonly known as: VITAMIN D ?Take 1 tablet (1,000 Units total) by mouth daily. ?   ?citalopram 20 MG tablet ?Commonly known as: CELEXA ?Take 1 tablet (20 mg total) by mouth at bedtime. ?   ?Combivent Respimat 20-100 MCG/ACT Aers respimat ?Generic drug: Ipratropium-Albuterol ?Inhale 2 puffs into the lungs 2 (two) times daily. ?   ?feeding supplement (GLUCERNA SHAKE) Liqd ?Take 237 mLs by mouth daily. ?   ?FeroSul 325 (65 FE) MG tablet ?Generic drug: ferrous sulfate ?Take 1 tablet (325 mg total) by mouth 2 (two) times daily. ?   ?fluticasone 50 MCG/ACT nasal spray ?Commonly known as: FLONASE ?Place 1 spray into both nostrils daily. ?   ?lamoTRIgine 100 MG tablet ?Commonly known as: LAMICTAL ?Take 1 tablet (100 mg total) by mouth daily. ?   ?Levemir FlexTouch 100 UNIT/ML FlexPen ?Generic drug: insulin detemir ?Inject 30 Units into the skin at bedtime. ?   ?metFORMIN 500 MG 24 hr tablet ?Commonly known as: GLUCOPHAGE-XR ?Take 2 tablets (1,000 mg total) by mouth daily with breakfast. ?   ?NovoLOG FlexPen 100 UNIT/ML FlexPen ?Generic drug: insulin aspart ?Inject 8 Units into the skin 3 (three) times daily. ?   ?omeprazole 20 MG capsule ?Commonly known as: PRILOSEC ?Take 1 capsule (20 mg total) by mouth daily. ?   ?risperiDONE 0.5 MG tablet ?Commonly known as: RISPERDAL ?Take 1 tablet  (0.5 mg total) by mouth at bedtime. ?   ?senna-docusate 8.6-50 MG tablet ?Commonly known as: Senokot-S ?Take 1 tablet by mouth at bedtime as needed for mild constipation. ?   ?Systane Balance 0.6 % Sol

## 2021-06-20 NOTE — Progress Notes (Signed)
Chaplain engaged in an initial visit with Renae Fickle.  Chaplain introduced herself and offered support.  Rorik voiced that he would be discharging soon.  Chaplain offered blessings over his journey out of the hospital.  ? ?Chaplain offered presence and support.  ? ? ? 06/20/21 1100  ?Clinical Encounter Type  ?Visited With Patient  ?Visit Type Initial;Spiritual support  ? ? ?

## 2021-06-20 NOTE — TOC Transition Note (Signed)
Transition of Care (TOC) - CM/SW Discharge Note ? ? ?Patient Details  ?Name: Douglas Cook ?MRN: [id number] ?Date of Birth: [birth year] ? ?Transition of Care (TOC) CM/SW Contact:  ?Leitha Bleak, RN ?Phone Number: ?06/20/2021, 10:46 AM ? ? ?Clinical Narrative:   Patient discharging back to [facility]. DC summary and FL2 sent for them to review. They will provide transportation. MD ordered HHPT.  [facility] requested [place], referral sent to [facility]. Call Sister to update her with DC plan.  ? ? ?Final next level of care: Assisted Living ?Barriers to Discharge: Barriers Resolved ? ? ?Patient Goals and CMS Choice ?Patient states their goals for this hospitalization and ongoing recovery are:: to return to ALF ?CMS Medicare.gov Compare Post Acute Care list provided to:: Patient Represenative (must comment) ?Choice offered to / list presented to : [facility] [address] POA / Guardian ? ?Discharge Placement ?  ?           ?Patient chooses bed at:  [facility]) ?Patient to be transferred to facility by: [facility] Staff ?Name of family member notified: Eber Jones ?Patient and family notified of of transfer: 06/20/21 ? ?Discharge Plan and Services ?  ?  ?Post Acute Care Choice: Home Health          ?   ?Readmission Risk Interventions ? ?  06/20/2021  ? 10:42 AM 06/19/2021  ? 10:14 AM 05/10/2021  ?  9:38 AM  ?Readmission Risk Prevention Plan  ?Transportation Screening Complete Complete Complete  ?HRI or Home Care Consult   Complete  ?Social Work Consult for Recovery Care Planning/Counseling   Complete  ?Palliative Care Screening   Not Applicable  ?Medication Review Oceanographer) Complete Complete Complete  ?PCP or Specialist appointment within 3-5 days of discharge Complete    ?HRI or Home Care Consult Complete Complete   ?SW Recovery Care/Counseling Consult Complete Complete   ?Palliative Care Screening Not Applicable Not Applicable   ?Skilled Nursing Facility Not Applicable Not Applicable   ? ? ? ? ? ?

## 2021-06-20 NOTE — Care Management Important Message (Signed)
Important Message ? ?Patient Details  ?Name: Douglas Cook ?MRN: [id number] ?Date of Birth: 1945/06/06 ? ? ?Medicare Important Message Given:  N/A - LOS <3 / Initial given by admissions ? ? ? ? ?Corey Harold ?06/20/2021, 10:40 AM ?

## 2021-06-20 NOTE — Progress Notes (Signed)
Patient discharged to [facility], transported by [employer] staff back to facility. Discharge paperwork given to facility staff driving to give to facility. Belongings sent with patient.  ?

## 2021-06-20 NOTE — Progress Notes (Signed)
Patient has been stable this shift.  Was in shift at start of shift, had sat in chair for a a few hours.  Patient tolerated well.  Patient assisted back to bed.  Patient vitals stable with no new complaints. ?

## 2021-06-21 LAB — URINE CULTURE: Culture: >=100000 — AB

## 2021-06-23 LAB — CULTURE, BLOOD (ROUTINE X 2)
Culture: NO GROWTH
Culture: NO GROWTH
Special Requests: ADEQUATE
Special Requests: ADEQUATE

## 2021-07-31 ENCOUNTER — Encounter (HOSPITAL_COMMUNITY): Payer: Self-pay

## 2021-07-31 ENCOUNTER — Other Ambulatory Visit: Payer: Self-pay

## 2021-07-31 ENCOUNTER — Emergency Department (HOSPITAL_COMMUNITY)
Admission: EM | Admit: 2021-07-31 | Discharge: 2021-07-31 | Disposition: A | Discharge status: Skilled Nursing Facility | Payer: Medicare Other | Attending: Emergency Medicine | Admitting: Emergency Medicine

## 2021-07-31 ENCOUNTER — Emergency Department (HOSPITAL_COMMUNITY): Payer: Medicare Other

## 2021-07-31 DIAGNOSIS — E1165 Type 2 diabetes mellitus with hyperglycemia: Secondary | ICD-10-CM | POA: Diagnosis not present

## 2021-07-31 DIAGNOSIS — R300 Dysuria: Secondary | ICD-10-CM | POA: Diagnosis not present

## 2021-07-31 DIAGNOSIS — Z7951 Long term (current) use of inhaled steroids: Secondary | ICD-10-CM | POA: Insufficient documentation

## 2021-07-31 DIAGNOSIS — N189 Chronic kidney disease, unspecified: Secondary | ICD-10-CM | POA: Insufficient documentation

## 2021-07-31 DIAGNOSIS — D649 Anemia, unspecified: Secondary | ICD-10-CM | POA: Diagnosis not present

## 2021-07-31 DIAGNOSIS — Z7982 Long term (current) use of aspirin: Secondary | ICD-10-CM | POA: Insufficient documentation

## 2021-07-31 DIAGNOSIS — R339 Retention of urine, unspecified: Secondary | ICD-10-CM | POA: Diagnosis present

## 2021-07-31 DIAGNOSIS — Z20822 Contact with and (suspected) exposure to covid-19: Secondary | ICD-10-CM | POA: Insufficient documentation

## 2021-07-31 DIAGNOSIS — Z794 Long term (current) use of insulin: Secondary | ICD-10-CM | POA: Insufficient documentation

## 2021-07-31 DIAGNOSIS — R350 Frequency of micturition: Secondary | ICD-10-CM | POA: Insufficient documentation

## 2021-07-31 DIAGNOSIS — R059 Cough, unspecified: Secondary | ICD-10-CM | POA: Insufficient documentation

## 2021-07-31 DIAGNOSIS — E1122 Type 2 diabetes mellitus with diabetic chronic kidney disease: Secondary | ICD-10-CM | POA: Insufficient documentation

## 2021-07-31 DIAGNOSIS — J449 Chronic obstructive pulmonary disease, unspecified: Secondary | ICD-10-CM | POA: Insufficient documentation

## 2021-07-31 DIAGNOSIS — Z7984 Long term (current) use of oral hypoglycemic drugs: Secondary | ICD-10-CM | POA: Diagnosis not present

## 2021-07-31 LAB — CBC WITH DIFFERENTIAL/PLATELET
Abs Immature Granulocytes: 0.04 10*3/uL (ref 0.00–0.07)
Basophils Absolute: 0.1 10*3/uL (ref 0.0–0.1)
Basophils Relative: 1 %
Eosinophils Absolute: 1.2 10*3/uL — ABNORMAL HIGH (ref 0.0–0.5)
Eosinophils Relative: 11 %
HCT: 32 % — ABNORMAL LOW (ref 39.0–52.0)
Hemoglobin: 10.3 g/dL — ABNORMAL LOW (ref 13.0–17.0)
Immature Granulocytes: 0 %
Lymphocytes Relative: 23 %
Lymphs Abs: 2.4 10*3/uL (ref 0.7–4.0)
MCH: 29.6 pg (ref 26.0–34.0)
MCHC: 32.2 g/dL (ref 30.0–36.0)
MCV: 92 fL (ref 80.0–100.0)
Monocytes Absolute: 1 10*3/uL (ref 0.1–1.0)
Monocytes Relative: 10 %
Neutro Abs: 5.7 10*3/uL (ref 1.7–7.7)
Neutrophils Relative %: 55 %
Platelets: 232 10*3/uL (ref 150–400)
RBC: 3.48 MIL/uL — ABNORMAL LOW (ref 4.22–5.81)
RDW: 13.7 % (ref 11.5–15.5)
WBC: 10.4 10*3/uL (ref 4.0–10.5)
nRBC: 0 % (ref 0.0–0.2)

## 2021-07-31 LAB — BASIC METABOLIC PANEL
Anion gap: 6 (ref 5–15)
BUN: 33 mg/dL — ABNORMAL HIGH (ref 8–23)
CO2: 30 mmol/L (ref 22–32)
Calcium: 9.4 mg/dL (ref 8.9–10.3)
Chloride: 104 mmol/L (ref 98–111)
Creatinine, Ser: 1.81 mg/dL — ABNORMAL HIGH (ref 0.61–1.24)
GFR, Estimated: 39 mL/min — ABNORMAL LOW (ref >60)
Glucose, Bld: 217 mg/dL — ABNORMAL HIGH (ref 70–99)
Potassium: 4.5 mmol/L (ref 3.5–5.1)
Sodium: 140 mmol/L (ref 135–145)

## 2021-07-31 LAB — URINALYSIS, ROUTINE W REFLEX MICROSCOPIC
Bacteria, UA: NONE SEEN
Bilirubin Urine: NEGATIVE
Glucose, UA: 50 mg/dL — AB
Hgb urine dipstick: NEGATIVE
Ketones, ur: NEGATIVE mg/dL
Nitrite: NEGATIVE
Protein, ur: NEGATIVE mg/dL
Specific Gravity, Urine: 1.01 (ref 1.005–1.030)
pH: 5 (ref 5.0–8.0)

## 2021-07-31 LAB — RESP PANEL BY RT-PCR (FLU A&B, COVID) ARPGX2
Influenza A by PCR: NEGATIVE
Influenza B by PCR: NEGATIVE
SARS Coronavirus 2 by RT PCR: NEGATIVE

## 2021-07-31 NOTE — ED Provider Notes (Signed)
?[facility] EMERGENCY DEPARTMENT ?Provider Note ? ? ?CSN: [id number] ?Arrival date & time: 07/31/21  [id number] ? ?  ? ?History ? ?Chief Complaint  ?Patient presents with  ? Urinary Frequency  ? ? ?Douglas Cook is a 76 y.o. male. ? ?HPI ?Patient with medical history including diabetes, COPD, CKD, GERD presents from assisted living due to complaints of dysuria.  Patient states that he has been having dysuria for last couple days, states it hurts when he urinates, he states he has been urinating more frequently, but endorse that he is able to get it all out, he denies any stomach pains nausea vomiting diarrhea, he denies any fevers chills chest pain shortness of breath.  Patient is a poor historian and was able unable to elaborate very much. ? ?I have reviewed patient's chart he has been admitted 2 times this year for sepsis/metabolic encephalopathy secondary due to UTI, most recent was seen in March, he had cultures growing for E. coli, he was started on Rocephin and then switched to cefdinir.  At baseline patient is oriented  x2, can ambulate but will need assistance with daily living activities. ? ?Spoke to nursing staff from nursing facility, they endorse that patient had a oral fever over the last 2 days, and then he started complaining of difficulty urination and they are concerned due to his history of UTIs that he will need to be evaluated.  States that he has been at his normal baseline, but even during the difficulty, no URI-like symptoms having no other complaints. ? ? ? ?Home Medications ?Prior to Admission medications   ?Medication Sig Start Date End Date Taking? Authorizing Provider  ?acetaminophen (TYLENOL) 325 MG tablet Take 2 tablets (650 mg total) by mouth every 6 (six) hours as needed for mild pain (or Fever >/= 101). 03/22/21  Yes Sharee HolsterGreen, Deborah S, NP  ?albuterol (VENTOLIN HFA) 108 (90 Base) MCG/ACT inhaler Inhale 1-2 puffs into the lungs every 4 (four) hours as needed for wheezing or shortness of  breath.   Yes [provider]  ?Alpha-Lipoic Acid 300 MG CAPS Take 1 capsule (300 mg total) by mouth daily at 12 noon. At 10 pm 03/22/21  Yes Sharee HolsterGreen, Deborah S, NP  ?aspirin EC 81 MG tablet Take 1 tablet (81 mg total) by mouth daily. Swallow whole. 03/22/21  Yes Sharee HolsterGreen, Deborah S, NP  ?atorvastatin (LIPITOR) 20 MG tablet Take 1 tablet (20 mg total) by mouth daily. 03/22/21  Yes Sharee HolsterGreen, Deborah S, NP  ?cetirizine (ZYRTEC) 10 MG tablet Take 1 tablet (10 mg total) by mouth at bedtime. 03/22/21  Yes Sharee HolsterGreen, Deborah S, NP  ?cholecalciferol (VITAMIN D) 25 MCG (1000 UNIT) tablet Take 1 tablet (1,000 Units total) by mouth daily. 03/22/21  Yes Sharee HolsterGreen, Deborah S, NP  ?citalopram (CELEXA) 20 MG tablet Take 1 tablet (20 mg total) by mouth at bedtime. 03/22/21  Yes Sharee HolsterGreen, Deborah S, NP  ?feeding supplement, GLUCERNA SHAKE, (GLUCERNA SHAKE) LIQD Take 237 mLs by mouth daily. 03/22/21  Yes Sharee HolsterGreen, Deborah S, NP  ?FEROSUL 325 (65 Fe) MG tablet Take 1 tablet (325 mg total) by mouth 2 (two) times daily. 03/22/21  Yes Sharee HolsterGreen, Deborah S, NP  ?fluticasone (FLONASE) 50 MCG/ACT nasal spray Place 1 spray into both nostrils daily. 03/22/21  Yes Sharee HolsterGreen, Deborah S, NP  ?Ipratropium-Albuterol (COMBIVENT RESPIMAT) 20-100 MCG/ACT AERS respimat Inhale 2 puffs into the lungs 2 (two) times daily. 03/22/21  Yes Sharee HolsterGreen, Deborah S, NP  ?lamoTRIgine (LAMICTAL) 100 MG tablet Take 1 tablet (100  mg total) by mouth daily. 03/22/21  Yes Sharee Holster, NP  ?LEVEMIR FLEXTOUCH 100 UNIT/ML FlexTouch Pen Inject 30 Units into the skin at bedtime. 03/22/21  Yes Sharee Holster, NP  ?metFORMIN (GLUCOPHAGE-XR) 500 MG 24 hr tablet Take 2 tablets (1,000 mg total) by mouth daily with breakfast. 03/22/21  Yes Sharee Holster, NP  ?NOVOLOG FLEXPEN 100 UNIT/ML FlexPen Inject 8 Units into the skin 3 (three) times daily. 03/22/21  Yes [provider]  ?omeprazole (PRILOSEC) 20 MG capsule Take 1 capsule (20 mg total) by mouth daily. 03/22/21  Yes Sharee Holster, NP  ?Propylene Glycol (SYSTANE BALANCE) 0.6 % SOLN Apply 1 drop to eye 2 (two) times daily. Both eyes 03/22/21  Yes Sharee Holster, NP  ?risperiDONE (RISPERDAL) 0.5 MG tablet Take 1 tablet (0.5 mg total) by mouth at bedtime. 03/22/21  Yes Sharee Holster, NP  ?senna-docusate (SENOKOT-S) 8.6-50 MG tablet Take 1 tablet by mouth at bedtime as needed for mild constipation. 03/22/21  Yes Sharee Holster, NP  ?tamsulosin (FLOMAX) 0.4 MG CAPS capsule Take 1 capsule (0.4 mg total) by mouth daily. 03/22/21  Yes Sharee Holster, NP  ?TRADJENTA 5 MG TABS tablet Take 1 tablet (5 mg total) by mouth daily. 03/22/21  Yes Sharee Holster, NP  ?traZODone (DESYREL) 100 MG tablet Take 1 tablet (100 mg total) by mouth at bedtime. 03/22/21  Yes Sharee Holster, NP  ?cefdinir (OMNICEF) 300 MG capsule Take 1 capsule (300 mg total) by mouth every 12 (twelve) hours. ?Patient not taking: Reported on 07/31/2021 06/20/21   Catarina Hartshorn, MD  ?   ? ?Allergies    ?Patient has no known allergies.   ? ?Review of Systems   ?Review of Systems  ?Constitutional:  Negative for chills and fever.  ?Respiratory:  Negative for shortness of breath.   ?Cardiovascular:  Negative for chest pain.  ?Gastrointestinal:  Negative for abdominal pain.  ?Genitourinary:  Positive for dysuria.  ?Neurological:  Negative for headaches.  ? ?Physical Exam ?Updated Vital Signs ?BP 106/62   Pulse 98   Temp 98.7 ?F (37.1 ?C) (Oral)   Resp 18   Ht 6' (1.829 m)   Wt 90.7 kg   SpO2 94%   BMI 27.12 kg/m?  ?Physical Exam ?Vitals and nursing note reviewed.  ?Constitutional:   ?   General: He is not in acute distress. ?   Appearance: He is not ill-appearing.  ?HENT:  ?   Head: Normocephalic and atraumatic.  ?   Nose: No congestion.  ?Eyes:  ?   Conjunctiva/sclera: Conjunctivae normal.  ?Cardiovascular:  ?   Rate and Rhythm: Normal rate and regular rhythm.  ?   Pulses: Normal pulses.  ?   Heart sounds: No murmur heard. ?  No friction rub. No gallop.  ?Pulmonary:  ?    Effort: No respiratory distress.  ?   Breath sounds: No wheezing, rhonchi or rales.  ?   Comments: Patient had slight cough on exam, but no evidence of respiratory distress,, no accessory muscle usage, lung sounds were clear bilaterally. ?Abdominal:  ?   Palpations: Abdomen is soft.  ?   Tenderness: There is no abdominal tenderness. There is no right CVA tenderness or left CVA tenderness.  ?   Comments: Abdomen nondistended noted bowel sounds, dull to percussion, abdomen was nontender on my exam, there is no suprapubic bulge/mass, he is nontender, he has no flank tenderness.  ?Musculoskeletal:  ?   Right lower  leg: No edema.  ?   Left lower leg: No edema.  ?Skin: ?   General: Skin is warm and dry.  ?Neurological:  ?   Mental Status: He is alert.  ?   Comments: Alert and orient x3, there is no facial asymmetry, no difficulty with word finding, following two-step commands, no unilateral weakness present.  ?Psychiatric:     ?   Mood and Affect: Mood normal.  ? ? ?ED Results / Procedures / Treatments   ?Labs ?(all labs ordered are listed, but only abnormal results are displayed) ?Labs Reviewed  ?BASIC METABOLIC PANEL - Abnormal; Notable for the following components:  ?    Result Value  ? Glucose, Bld 217 (*)   ? BUN 33 (*)   ? Creatinine, Ser 1.81 (*)   ? GFR, Estimated 39 (*)   ? All other components within normal limits  ?CBC WITH DIFFERENTIAL/PLATELET - Abnormal; Notable for the following components:  ? RBC 3.48 (*)   ? Hemoglobin 10.3 (*)   ? HCT 32.0 (*)   ? Eosinophils Absolute 1.2 (*)   ? All other components within normal limits  ?URINALYSIS, ROUTINE W REFLEX MICROSCOPIC - Abnormal; Notable for the following components:  ? Glucose, UA 50 (*)   ? Leukocytes,Ua TRACE (*)   ? All other components within normal limits  ?RESP PANEL BY RT-PCR (FLU A&B, COVID) ARPGX2  ? ? ?EKG ?None ? ?Radiology ?DG Chest Portable 1 View ? ?Result Date: 07/31/2021 ?CLINICAL DATA:  76 year old male with cough and painful urination for 1  week. EXAM: PORTABLE CHEST 1 VIEW COMPARISON:  Portable chest 06/18/2021 and earlier. FINDINGS: Portable AP upright view at 1016 hours. Mildly rotated to the right. Lung volumes and mediastinal contours remain within no

## 2021-07-31 NOTE — ED Notes (Signed)
Report called to Douglas Cook at [facility] living. Per stacey, transporation is in [place] with another resident and would be here to pick up this patient whenever they could. NO known ETA given.  ?

## 2021-07-31 NOTE — ED Notes (Signed)
Bladder scan approximately . Dr. Effie Shy notified and stated to have patient void and then scan bladder again for post residual.  ?

## 2021-07-31 NOTE — ED Provider Notes (Signed)
?  Face-to-face evaluation ? ? ?History: Patient here for evaluation of difficulty urinating characterized by both pain and frequency.  Has a history of frequent UTIs, requiring treatment. ? ?Physical exam: Elderly, alert and cooperative.  Abdomen is not distended.  Abdomen soft nontender.  There is no palpable mass.  He is moderately obese ? ?MDM: Evaluation for  ?Chief Complaint  ?Patient presents with  ? Urinary Frequency  ?  ? ?Patient presenting for evaluation of decreased urine output.  He has a history of prostatic hypertrophy.  He lives in an assisted living facility.  Bladder scanning indicates high-volume Rosanne Gutting retention requiring Foley catheterization. ? ?Medical screening examination/treatment/procedure(s) were conducted as a shared visit with non-physician practitioner(s) and myself.  I personally evaluated the patient during the encounter ? ?  ?Daleen Bo, MD ?08/01/21 1500 ? ?

## 2021-07-31 NOTE — ED Triage Notes (Signed)
Patient via EMS from [facility] assisted living. Due to painful urination form about 1 week. ?

## 2021-07-31 NOTE — Discharge Instructions (Signed)
Patient is a Foley catheter in place, please change frequently.  Please come with all home medications. ? ?Follow-up with urology for further management. ? ?Come back to the emergency department if you develop chest pain, shortness of breath, severe abdominal pain, uncontrolled nausea, vomiting, diarrhea. ? ?

## 2021-08-10 ENCOUNTER — Ambulatory Visit (INDEPENDENT_AMBULATORY_CARE_PROVIDER_SITE_OTHER): Payer: Medicare Other | Admitting: Urology

## 2021-08-10 ENCOUNTER — Encounter: Payer: Self-pay | Admitting: Urology

## 2021-08-10 VITALS — BP 114/67 | HR 86 | Ht 66.0 in

## 2021-08-10 DIAGNOSIS — Z87440 Personal history of urinary (tract) infections: Secondary | ICD-10-CM

## 2021-08-10 DIAGNOSIS — R339 Retention of urine, unspecified: Secondary | ICD-10-CM

## 2021-08-10 NOTE — Progress Notes (Signed)
Assessment: 1. Urinary retention   2. History of UTI     Plan: I reviewed the records from his recent ER visit as well as hospital records from his prior hospitalizations. Continue Foley today. Remove Foley catheter early morning on 08/14/2021. Replace Foley catheter if patient unable to void after 6-8 hours or as needed discomfort. Continue tamsulosin. Begin cefdinir 300 mg p.o. twice daily x5 days on 08/12/2021. Return to office in 1 week with bladder scan.  Chief Complaint:  Chief Complaint  Patient presents with   Urinary Retention    History of Present Illness:  Douglas Cook is a 76 y.o. year old male who is seen in consultation from Doreen Beam B, MD for evaluation of urinary retention and history of UTIs.  He was diagnosed with a UTI in February 2023 and again in March 2023.  Urine cultures from 2/23 and 3/23 grew E. coli.  He was treated with appropriate antibiotics.  He had a Foley catheter placed in February 2023 with return of 600 mL.  The Foley catheter was removed prior to discharge from the hospital.  He recently presented to the emergency room on 07/31/2021 with urinary frequency and dysuria.  Urinalysis showed 0-5 RBCs, 6-10 WBCs, and no bacteria. Bladder scan revealed a volume of 347 mL.  A Foley catheter was placed with return of 600 mL of urine. His Foley has been draining well.  No gross hematuria. He has been on tamsulosin since 04/25/2021.  No recent imaging. He is currently a [facility] at [facility].  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)    Arthritis    CKD (chronic kidney disease), stage III (HCC)    COPD (chronic obstructive pulmonary disease) (HCC)    Depression    Diabetes mellitus without complication (HCC)    Fatigue    GERD (gastroesophageal reflux disease)    Hyperlipidemia    Osteoarthritis    Reflux     Past Surgical History:  Past  Surgical History:  Procedure Laterality Date   HIP ARTHROPLASTY Left 02/03/2021   Procedure: ARTHROPLASTY BIPOLAR HIP (HEMIARTHROPLASTY);  Surgeon: Vickki Hearing, MD;  Location: [facility];  Service: Orthopedics;  Laterality: Left;    Allergies:  No Known Allergies  Family History:  Family History  Problem Relation Age of Onset   Stroke Mother    Diabetes Father     Social History:  Social History   Tobacco Use   Smoking status: Former    Types: Pipe   Smokeless tobacco: Never  Building services engineer Use: Never used  Substance Use Topics   Alcohol use: No   Drug use: No    Review of symptoms:  Constitutional:  Negative for unexplained weight loss, night sweats, fever, chills ENT:  Negative for nose bleeds, sinus pain, painful swallowing CV:  Negative for chest pain, shortness of breath, exercise intolerance, palpitations, loss of consciousness Resp:  Negative for cough, wheezing, shortness of breath GI:  Negative for nausea, vomiting, diarrhea, bloody stools GU:  Positives noted in HPI; otherwise negative for gross hematuria, urinary incontinence Neuro:  Negative for seizures, poor balance, limb weakness, slurred speech Psych:  Negative for lack of energy, depression, anxiety Endocrine:  Negative for polydipsia, polyuria, symptoms of hypoglycemia (dizziness, hunger, sweating) Hematologic:  Negative for anemia, purpura, petechia, prolonged or excessive bleeding, use of anticoagulants  Allergic:  Negative for difficulty  breathing or choking as a result of exposure to anything; no shellfish allergy; no allergic response (rash/itch) to materials, foods  Physical exam: BP 114/67 (BP Location: Left Arm, Patient Position: Sitting, Cuff Size: Normal)   Pulse 86   Ht 5\' 6"  (1.676 m)   BMI 32.28 kg/m  GENERAL APPEARANCE:  Well appearing, well developed, well nourished, NAD HEENT: Atraumatic, Normocephalic, oropharynx clear. NECK: Supple without lymphadenopathy or  thyromegaly. LUNGS: Clear to auscultation bilaterally. HEART: Regular Rate and Rhythm without murmurs, gallops, or rubs. ABDOMEN: Soft, non-tender, No Masses. EXTREMITIES: Moves all extremities well.  Without clubbing, cyanosis, or edema. NEUROLOGIC:  Alert and oriented x 3, in wheelchair, CN II-XII grossly intact.  MENTAL STATUS:  Appropriate. BACK:  Non-tender to palpation.  No CVAT SKIN:  Warm, dry and intact.   GU: Foley draining clear urine.  Results: None

## 2021-08-14 ENCOUNTER — Other Ambulatory Visit: Payer: Self-pay

## 2021-08-14 ENCOUNTER — Encounter (HOSPITAL_COMMUNITY): Payer: Self-pay

## 2021-08-14 ENCOUNTER — Emergency Department (HOSPITAL_COMMUNITY)
Admission: EM | Admit: 2021-08-14 | Discharge: 2021-08-15 | Disposition: A | Discharge status: Skilled Nursing Facility | Payer: Medicare Other | Attending: Emergency Medicine | Admitting: Emergency Medicine

## 2021-08-14 ENCOUNTER — Telehealth: Payer: Self-pay

## 2021-08-14 DIAGNOSIS — N342 Other urethritis: Secondary | ICD-10-CM | POA: Insufficient documentation

## 2021-08-14 DIAGNOSIS — Z7982 Long term (current) use of aspirin: Secondary | ICD-10-CM | POA: Insufficient documentation

## 2021-08-14 DIAGNOSIS — R319 Hematuria, unspecified: Secondary | ICD-10-CM | POA: Diagnosis present

## 2021-08-14 NOTE — ED Triage Notes (Signed)
Pt bought in form  [facility] POV.  Pain at foley insertion site x1 week.

## 2021-08-14 NOTE — Telephone Encounter (Signed)
Received call from home health nurse. Patient has not voided since early am after catheter removed.  Order given to reinsert catheter #16 french.  Patient to f/u with MD on 05/25

## 2021-08-15 DIAGNOSIS — N342 Other urethritis: Secondary | ICD-10-CM | POA: Diagnosis not present

## 2021-08-15 MED ORDER — CEPHALEXIN 500 MG PO CAPS: 500.0000 mg | ORAL_CAPSULE | Freq: Four times a day (QID) | ORAL | 0 refills | Status: DC

## 2021-08-15 MED ORDER — CEPHALEXIN 500 MG PO CAPS
1000.0000 mg | ORAL_CAPSULE | Freq: Once | ORAL | Status: AC
Start: 2021-08-15 — End: 2021-08-15
  Administered 2021-08-15: 1000 mg via ORAL
  Filled 2021-08-15: qty 2

## 2021-08-15 NOTE — ED Provider Notes (Signed)
[facility] EMERGENCY DEPARTMENT Provider Note   CSN: [id number] Arrival date & time: 08/14/21  2212     History  Chief Complaint  Patient presents with   Foley Pain     Douglas Cook is a 76 y.o. male.  Foley catheter placed about a week ago secondary to hematuria and incontinence.  Patient states for the last couple days he has had pain around the catheter site on his penis.  No other changes.  No trauma.       Home Medications Prior to Admission medications   Medication Sig Start Date End Date Taking? Authorizing Provider  cephALEXin (KEFLEX) 500 MG capsule Take 1 capsule (500 mg total) by mouth 4 (four) times daily. 08/15/21  Yes Marni Franzoni, Barbara Cower, MD  acetaminophen (TYLENOL) 325 MG tablet Take 2 tablets (650 mg total) by mouth every 6 (six) hours as needed for mild pain (or Fever >/= 101). 03/22/21   Sharee Holster, NP  albuterol (VENTOLIN HFA) 108 (90 Base) MCG/ACT inhaler Inhale 1-2 puffs into the lungs every 4 (four) hours as needed for wheezing or shortness of breath.    [provider]  Alpha-Lipoic Acid 300 MG CAPS Take 1 capsule (300 mg total) by mouth daily at 12 noon. At 10 pm 03/22/21   Sharee Holster, NP  aspirin EC 81 MG tablet Take 1 tablet (81 mg total) by mouth daily. Swallow whole. 03/22/21   Sharee Holster, NP  atorvastatin (LIPITOR) 20 MG tablet Take 1 tablet (20 mg total) by mouth daily. 03/22/21   Sharee Holster, NP  cefdinir (OMNICEF) 300 MG capsule Take 1 capsule (300 mg total) by mouth every 12 (twelve) hours. Patient not taking: Reported on 07/31/2021 06/20/21   Catarina Hartshorn, MD  cetirizine (ZYRTEC) 10 MG tablet Take 1 tablet (10 mg total) by mouth at bedtime. 03/22/21   Sharee Holster, NP  cholecalciferol (VITAMIN D) 25 MCG (1000 UNIT) tablet Take 1 tablet (1,000 Units total) by mouth daily. 03/22/21   Sharee Holster, NP  citalopram (CELEXA) 20 MG tablet Take 1 tablet (20 mg total) by mouth at bedtime. 03/22/21   Sharee Holster, NP   feeding supplement, GLUCERNA SHAKE, (GLUCERNA SHAKE) LIQD Take 237 mLs by mouth daily. 03/22/21   Sharee Holster, NP  FEROSUL 325 (65 Fe) MG tablet Take 1 tablet (325 mg total) by mouth 2 (two) times daily. 03/22/21   Sharee Holster, NP  fluticasone (FLONASE) 50 MCG/ACT nasal spray Place 1 spray into both nostrils daily. 03/22/21   Sharee Holster, NP  Ipratropium-Albuterol (COMBIVENT RESPIMAT) 20-100 MCG/ACT AERS respimat Inhale 2 puffs into the lungs 2 (two) times daily. 03/22/21   Sharee Holster, NP  lamoTRIgine (LAMICTAL) 100 MG tablet Take 1 tablet (100 mg total) by mouth daily. 03/22/21   Sharee Holster, NP  LEVEMIR FLEXTOUCH 100 UNIT/ML FlexTouch Pen Inject 30 Units into the skin at bedtime. 03/22/21   Sharee Holster, NP  metFORMIN (GLUCOPHAGE-XR) 500 MG 24 hr tablet Take 2 tablets (1,000 mg total) by mouth daily with breakfast. 03/22/21   Sharee Holster, NP  NOVOLOG FLEXPEN 100 UNIT/ML FlexPen Inject 8 Units into the skin 3 (three) times daily. 03/22/21   [provider]  omeprazole (PRILOSEC) 20 MG capsule Take 1 capsule (20 mg total) by mouth daily. 03/22/21   Sharee Holster, NP  Propylene Glycol (SYSTANE BALANCE) 0.6 % SOLN Apply 1 drop to eye 2 (two) times daily. Both eyes  03/22/21   Sharee Holster, NP  risperiDONE (RISPERDAL) 0.5 MG tablet Take 1 tablet (0.5 mg total) by mouth at bedtime. 03/22/21   Sharee Holster, NP  senna-docusate (SENOKOT-S) 8.6-50 MG tablet Take 1 tablet by mouth at bedtime as needed for mild constipation. 03/22/21   Sharee Holster, NP  tamsulosin (FLOMAX) 0.4 MG CAPS capsule Take 1 capsule (0.4 mg total) by mouth daily. 03/22/21   Sharee Holster, NP  TRADJENTA 5 MG TABS tablet Take 1 tablet (5 mg total) by mouth daily. 03/22/21   Sharee Holster, NP  traZODone (DESYREL) 100 MG tablet Take 1 tablet (100 mg total) by mouth at bedtime. 03/22/21   Sharee Holster, NP      Allergies    Patient has no known allergies.    Review of  Systems   Review of Systems  Physical Exam Updated Vital Signs BP (!) 126/58 (BP Location: Right Arm)   Pulse 80   Resp 17   Wt 91 kg   SpO2 97%   BMI 32.38 kg/m  Physical Exam Vitals and nursing note reviewed.  Constitutional:      Appearance: He is well-developed.  HENT:     Head: Normocephalic and atraumatic.     Mouth/Throat:     Mouth: Mucous membranes are moist.     Pharynx: Oropharynx is clear.  Eyes:     Pupils: Pupils are equal, round, and reactive to light.  Cardiovascular:     Rate and Rhythm: Normal rate.  Pulmonary:     Effort: Pulmonary effort is normal. No respiratory distress.  Abdominal:     General: Abdomen is flat. There is no distension.  Genitourinary:    Comments: Mild purulent drainage around the catheter.  Otherwise doing okay and intact mild erythema around the urethra as well. Musculoskeletal:        General: Normal range of motion.     Cervical back: Normal range of motion.  Skin:    General: Skin is warm and dry.  Neurological:     General: No focal deficit present.     Mental Status: He is alert.    ED Results / Procedures / Treatments   Labs (all labs ordered are listed, but only abnormal results are displayed) Labs Reviewed  URINE CULTURE    EKG None  Radiology No results found.  Procedures Procedures    Medications Ordered in ED Medications  cephALEXin (KEFLEX) capsule 1,000 mg (1,000 mg Oral Given 08/15/21 0059)    ED Course/ Medical Decision Making/ A&P                           Medical Decision Making Amount and/or Complexity of Data Reviewed Labs: ordered.  Risk Prescription drug management.   Likely mild urethritis from the catheter.  No other obvious abnormalities on exam. Urine sent for culture.    Final Clinical Impression(s) / ED Diagnoses Final diagnoses:  Urethritis    Rx / DC Orders ED Discharge Orders          Ordered    cephALEXin (KEFLEX) 500 MG capsule  4 times daily        08/15/21  0041              Sitlaly Gudiel, Barbara Cower, MD 08/15/21 [phone]

## 2021-08-15 NOTE — ED Notes (Signed)
Catheter bag changed 

## 2021-08-15 NOTE — ED Notes (Signed)
[facility] notified of patient needing transportation back to [facility].

## 2021-08-16 LAB — URINE CULTURE: Culture: NO GROWTH

## 2021-08-17 ENCOUNTER — Ambulatory Visit (INDEPENDENT_AMBULATORY_CARE_PROVIDER_SITE_OTHER): Payer: Medicare Other | Admitting: Urology

## 2021-08-17 ENCOUNTER — Encounter: Payer: Self-pay | Admitting: Urology

## 2021-08-17 VITALS — BP 106/68 | HR 79 | Ht 66.0 in | Wt 200.6 lb

## 2021-08-17 DIAGNOSIS — Z87440 Personal history of urinary (tract) infections: Secondary | ICD-10-CM

## 2021-08-17 DIAGNOSIS — R339 Retention of urine, unspecified: Secondary | ICD-10-CM

## 2021-08-17 NOTE — Progress Notes (Signed)
Assessment: 1. Urinary retention   2. History of UTI     Plan: Continue Foley today. Complete antibiotics. Discontinue tamsulosin. Alfuzosin 10 mg p.o. daily. Remove Foley catheter early morning on 08/23/2021. Replace Foley catheter if patient unable to void after 6-8 hours or as needed discomfort. Return to office in 2 weeks   Chief Complaint:  Chief Complaint  Patient presents with   Urinary Retention    History of Present Illness:  Douglas Cook is a 76 y.o. year old male who is seen for further evaluation of urinary retention and history of UTIs.  He was diagnosed with a UTI in February 2023 and again in March 2023.  Urine cultures from 2/23 and 3/23 grew E. coli.  He was treated with appropriate antibiotics.  He had a Foley catheter placed in February 2023 with return of 600 mL.  The Foley catheter was removed prior to discharge from the hospital.  He presented to the emergency room on 07/31/2021 with urinary frequency and dysuria.  Urinalysis showed 0-5 RBCs, 6-10 WBCs, and no bacteria. Bladder scan revealed a volume of 347 mL.  A Foley catheter was placed with return of 600 mL of urine.  He has been on tamsulosin since 04/25/2021. No recent imaging. His Foley was removed on 08/14/2021.  Unfortunately, he was unable to void and a catheter was reinserted later that day. He was seen in the emergency room on 08/15/2021 with discomfort associated with the Foley catheter.  He was started on cefdinir. Urine culture showed no growth. His catheter has been draining well.  He is not having any symptoms related to the catheter today.  No gross hematuria.  He is currently a patient at [facility].  Portions of the above documentation were copied from a prior visit for review purposes only.   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)    Arthritis    CKD (chronic kidney disease), stage III (HCC)     COPD (chronic obstructive pulmonary disease) (HCC)    Depression    Diabetes mellitus without complication (HCC)    Fatigue    GERD (gastroesophageal reflux disease)    Hyperlipidemia    Osteoarthritis    Reflux     Past Surgical History:  Past Surgical History:  Procedure Laterality Date   HIP ARTHROPLASTY Left 02/03/2021   Procedure: ARTHROPLASTY BIPOLAR HIP (HEMIARTHROPLASTY);  Surgeon: Carole Civil, MD;  Location: [facility];  Service: Orthopedics;  Laterality: Left;    Allergies:  No Known Allergies  Family History:  Family History  Problem Relation Age of Onset   Stroke Mother    Diabetes Father     Social History:  Social History   Tobacco Use   Smoking status: Former    Types: Pipe   Smokeless tobacco: Never  Scientific laboratory technician Use: Never used  Substance Use Topics   Alcohol use: No   Drug use: No    ROS: Constitutional:  Negative for fever, chills, weight loss CV: Negative for chest pain, previous MI, hypertension Respiratory:  Negative for shortness of breath, wheezing, sleep apnea, frequent cough GI:  Negative for nausea, vomiting, bloody stool, GERD  Physical exam: BP 106/68   Pulse 79   Ht 5\' 6"  (1.676 m)   Wt 200 lb 9.9 oz (91 kg)   BMI 32.38 kg/m  GENERAL APPEARANCE:  Well appearing, well developed, well nourished,  NAD HEENT:  Atraumatic, normocephalic, oropharynx clear NECK:  Supple without lymphadenopathy or thyromegaly ABDOMEN:  Soft, non-tender, no masses EXTREMITIES: Without clubbing, cyanosis, or edema NEUROLOGIC:  Alert and oriented x 3, in wheelchair, CN II-XII grossly intact MENTAL STATUS:  appropriate BACK:  Non-tender to palpation, No CVAT SKIN:  Warm, dry, and intact  Results: None

## 2021-08-29 ENCOUNTER — Other Ambulatory Visit: Payer: Self-pay | Admitting: Urology

## 2021-09-01 ENCOUNTER — Ambulatory Visit: Payer: Medicare Other | Admitting: Urology

## 2021-09-12 ENCOUNTER — Encounter: Payer: Self-pay | Admitting: Urology

## 2021-09-12 ENCOUNTER — Ambulatory Visit (INDEPENDENT_AMBULATORY_CARE_PROVIDER_SITE_OTHER): Payer: Medicare Other | Admitting: Urology

## 2021-09-12 VITALS — BP 131/75 | HR 80

## 2021-09-12 DIAGNOSIS — R339 Retention of urine, unspecified: Secondary | ICD-10-CM

## 2021-09-12 DIAGNOSIS — N138 Other obstructive and reflux uropathy: Secondary | ICD-10-CM

## 2021-09-12 DIAGNOSIS — Z87440 Personal history of urinary (tract) infections: Secondary | ICD-10-CM

## 2021-09-12 DIAGNOSIS — N401 Enlarged prostate with lower urinary tract symptoms: Secondary | ICD-10-CM

## 2021-09-12 DIAGNOSIS — Z87898 Personal history of other specified conditions: Secondary | ICD-10-CM

## 2021-09-12 LAB — BLADDER SCAN AMB NON-IMAGING: Scan Result: 77

## 2021-09-12 NOTE — Progress Notes (Signed)
Assessment: 1. BPH with obstruction/lower urinary tract symptoms   2. History of urinary retention   3. History of UTI     Plan: Continue alfuzosin 10 mg p.o. daily. Return to office in 2 months  Chief Complaint:  Chief Complaint  Patient presents with   Urinary Retention    History of Present Illness:  Douglas Cook is a 76 y.o. year old male who is seen for further evaluation of urinary retention and history of UTIs.  He was diagnosed with a UTI in February 2023 and again in March 2023.  Urine cultures from 2/23 and 3/23 grew E. coli.  He was treated with appropriate antibiotics.  He had a Foley catheter placed in February 2023 with return of 600 mL.  The Foley catheter was removed prior to discharge from the hospital.  He presented to the emergency room on 07/31/2021 with urinary frequency and dysuria.  Urinalysis showed 0-5 RBCs, 6-10 WBCs, and no bacteria. Bladder scan revealed a volume of 347 mL.  A Foley catheter was placed with return of 600 mL of urine.  He had been on tamsulosin since 04/25/2021. His Foley was removed on 08/14/2021.  Unfortunately, he was unable to void and a catheter was reinserted later that day. He was seen in the emergency room on 08/15/2021 with discomfort associated with the Foley catheter.  He was started on cefdinir. Urine culture showed no growth. At his visit on 08/17/21, his foley was draining well.  He was changed to alfuzosin. His foley was removed on 08/23/21.    He has been voiding spontaneously since the catheter was removed.  He feels like he is emptying his bladder well.  He continues on alfuzosin.  No dysuria or gross hematuria. IPSS = 1 today.  He is currently a patient at [facility].  Portions of the above documentation were copied from a prior visit for review purposes only.   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)    Arthritis    CKD  (chronic kidney disease), stage III (HCC)    COPD (chronic obstructive pulmonary disease) (HCC)    Depression    Diabetes mellitus without complication (HCC)    Fatigue    GERD (gastroesophageal reflux disease)    Hyperlipidemia    Osteoarthritis    Reflux     Past Surgical History:  Past Surgical History:  Procedure Laterality Date   HIP ARTHROPLASTY Left 02/03/2021   Procedure: ARTHROPLASTY BIPOLAR HIP (HEMIARTHROPLASTY);  Surgeon: Vickki Hearing, MD;  Location: [facility];  Service: Orthopedics;  Laterality: Left;    Allergies:  No Known Allergies  Family History:  Family History  Problem Relation Age of Onset   Stroke Mother    Diabetes Father     Social History:  Social History   Tobacco Use   Smoking status: Former    Types: Pipe   Smokeless tobacco: Never  Building services engineer Use: Never used  Substance Use Topics   Alcohol use: No   Drug use: No    ROS: Constitutional:  Negative for fever, chills, weight loss CV: Negative for chest pain, previous MI, hypertension Respiratory:  Negative for shortness of breath, wheezing, sleep apnea, frequent cough GI:  Negative for nausea, vomiting, bloody stool, GERD  Physical exam: BP 131/75   Pulse 80  GENERAL APPEARANCE:  Well appearing, well developed, well nourished, NAD HEENT:  Atraumatic,  normocephalic, oropharynx clear NECK:  Supple without lymphadenopathy or thyromegaly ABDOMEN:  Soft, non-tender, no masses EXTREMITIES:  Moves all extremities well, without clubbing, cyanosis, or edema NEUROLOGIC:  Alert and oriented x 3, in wheelchair, CN II-XII grossly intact MENTAL STATUS:  appropriate BACK:  Non-tender to palpation, No CVAT SKIN:  Warm, dry, and intact  Results: No sample provided  Bladder scan = 77 mL

## 2021-09-12 NOTE — Progress Notes (Signed)
post void residual=77 

## 2021-09-17 ENCOUNTER — Other Ambulatory Visit: Payer: Self-pay

## 2021-09-17 ENCOUNTER — Inpatient Hospital Stay (HOSPITAL_COMMUNITY)
Admission: EM | Admit: 2021-09-17 | Discharge: 2021-09-20 | DRG: 872 | Disposition: A | Discharge status: Home Health | Payer: Medicare Other | Source: Skilled Nursing Facility | Attending: Internal Medicine | Admitting: Internal Medicine

## 2021-09-17 ENCOUNTER — Encounter (HOSPITAL_COMMUNITY): Payer: Self-pay | Admitting: Emergency Medicine

## 2021-09-17 ENCOUNTER — Observation Stay (HOSPITAL_COMMUNITY): Payer: Medicare Other

## 2021-09-17 DIAGNOSIS — Z79899 Other long term (current) drug therapy: Secondary | ICD-10-CM

## 2021-09-17 DIAGNOSIS — K219 Gastro-esophageal reflux disease without esophagitis: Secondary | ICD-10-CM | POA: Diagnosis present

## 2021-09-17 DIAGNOSIS — M1990 Unspecified osteoarthritis, unspecified site: Secondary | ICD-10-CM | POA: Diagnosis present

## 2021-09-17 DIAGNOSIS — Z7984 Long term (current) use of oral hypoglycemic drugs: Secondary | ICD-10-CM

## 2021-09-17 DIAGNOSIS — N1832 Chronic kidney disease, stage 3b: Secondary | ICD-10-CM | POA: Diagnosis present

## 2021-09-17 DIAGNOSIS — R739 Hyperglycemia, unspecified: Secondary | ICD-10-CM | POA: Diagnosis present

## 2021-09-17 DIAGNOSIS — I10 Essential (primary) hypertension: Secondary | ICD-10-CM | POA: Diagnosis not present

## 2021-09-17 DIAGNOSIS — N3000 Acute cystitis without hematuria: Secondary | ICD-10-CM

## 2021-09-17 DIAGNOSIS — A4159 Other Gram-negative sepsis: Secondary | ICD-10-CM | POA: Diagnosis not present

## 2021-09-17 DIAGNOSIS — J449 Chronic obstructive pulmonary disease, unspecified: Secondary | ICD-10-CM | POA: Diagnosis present

## 2021-09-17 DIAGNOSIS — N183 Chronic kidney disease, stage 3 unspecified: Secondary | ICD-10-CM | POA: Diagnosis present

## 2021-09-17 DIAGNOSIS — Z7982 Long term (current) use of aspirin: Secondary | ICD-10-CM

## 2021-09-17 DIAGNOSIS — Z87440 Personal history of urinary (tract) infections: Secondary | ICD-10-CM

## 2021-09-17 DIAGNOSIS — R319 Hematuria, unspecified: Secondary | ICD-10-CM

## 2021-09-17 DIAGNOSIS — F0393 Unspecified dementia, unspecified severity, with mood disturbance: Secondary | ICD-10-CM | POA: Diagnosis present

## 2021-09-17 DIAGNOSIS — I129 Hypertensive chronic kidney disease with stage 1 through stage 4 chronic kidney disease, or unspecified chronic kidney disease: Secondary | ICD-10-CM | POA: Diagnosis present

## 2021-09-17 DIAGNOSIS — N390 Urinary tract infection, site not specified: Secondary | ICD-10-CM | POA: Diagnosis present

## 2021-09-17 DIAGNOSIS — E1165 Type 2 diabetes mellitus with hyperglycemia: Secondary | ICD-10-CM | POA: Diagnosis present

## 2021-09-17 DIAGNOSIS — Z96642 Presence of left artificial hip joint: Secondary | ICD-10-CM | POA: Diagnosis present

## 2021-09-17 DIAGNOSIS — E1122 Type 2 diabetes mellitus with diabetic chronic kidney disease: Secondary | ICD-10-CM | POA: Diagnosis present

## 2021-09-17 DIAGNOSIS — F32A Depression, unspecified: Secondary | ICD-10-CM | POA: Diagnosis present

## 2021-09-17 DIAGNOSIS — E785 Hyperlipidemia, unspecified: Secondary | ICD-10-CM | POA: Diagnosis present

## 2021-09-17 DIAGNOSIS — F0390 Unspecified dementia without behavioral disturbance: Secondary | ICD-10-CM | POA: Diagnosis present

## 2021-09-17 DIAGNOSIS — Z833 Family history of diabetes mellitus: Secondary | ICD-10-CM

## 2021-09-17 DIAGNOSIS — Z794 Long term (current) use of insulin: Secondary | ICD-10-CM

## 2021-09-17 DIAGNOSIS — Z7951 Long term (current) use of inhaled steroids: Secondary | ICD-10-CM

## 2021-09-17 DIAGNOSIS — D631 Anemia in chronic kidney disease: Secondary | ICD-10-CM | POA: Diagnosis present

## 2021-09-17 LAB — COMPREHENSIVE METABOLIC PANEL
ALT: 19 U/L (ref 0–44)
AST: 13 U/L — ABNORMAL LOW (ref 15–41)
Albumin: 3.7 g/dL (ref 3.5–5.0)
Alkaline Phosphatase: 60 U/L (ref 38–126)
Anion gap: 10 (ref 5–15)
BUN: 34 mg/dL — ABNORMAL HIGH (ref 8–23)
CO2: 26 mmol/L (ref 22–32)
Calcium: 9.3 mg/dL (ref 8.9–10.3)
Chloride: 97 mmol/L — ABNORMAL LOW (ref 98–111)
Creatinine, Ser: 1.85 mg/dL — ABNORMAL HIGH (ref 0.61–1.24)
GFR, Estimated: 38 mL/min — ABNORMAL LOW (ref >60)
Glucose, Bld: 369 mg/dL — ABNORMAL HIGH (ref 70–99)
Potassium: 4.2 mmol/L (ref 3.5–5.1)
Sodium: 133 mmol/L — ABNORMAL LOW (ref 135–145)
Total Bilirubin: 0.4 mg/dL (ref 0.3–1.2)
Total Protein: 8 g/dL (ref 6.5–8.1)

## 2021-09-17 LAB — URINALYSIS, ROUTINE W REFLEX MICROSCOPIC
Bacteria, UA: NONE SEEN
Bilirubin Urine: NEGATIVE
Glucose, UA: >=500 mg/dL — AB
Hgb urine dipstick: NEGATIVE
Ketones, ur: NEGATIVE mg/dL
Nitrite: NEGATIVE
Protein, ur: 100 mg/dL — AB
Specific Gravity, Urine: 1.018 (ref 1.005–1.030)
WBC, UA: >50 WBC/hpf — ABNORMAL HIGH (ref 0–5)
pH: 8 (ref 5.0–8.0)

## 2021-09-17 LAB — CBC
HCT: 36.9 % — ABNORMAL LOW (ref 39.0–52.0)
Hemoglobin: 11.9 g/dL — ABNORMAL LOW (ref 13.0–17.0)
MCH: 29.5 pg (ref 26.0–34.0)
MCHC: 32.2 g/dL (ref 30.0–36.0)
MCV: 91.6 fL (ref 80.0–100.0)
Platelets: 223 10*3/uL (ref 150–400)
RBC: 4.03 MIL/uL — ABNORMAL LOW (ref 4.22–5.81)
RDW: 13.3 % (ref 11.5–15.5)
WBC: 8.3 10*3/uL (ref 4.0–10.5)
nRBC: 0 % (ref 0.0–0.2)

## 2021-09-17 LAB — CBG MONITORING, ED
Glucose-Capillary: 427 mg/dL — ABNORMAL HIGH (ref 70–99)
Glucose-Capillary: 282 mg/dL — ABNORMAL HIGH (ref 70–99)
Glucose-Capillary: 218 mg/dL — ABNORMAL HIGH (ref 70–99)

## 2021-09-17 LAB — LACTIC ACID, PLASMA
Lactic Acid, Venous: 2.3 mmol/L (ref 0.5–1.9)
Lactic Acid, Venous: 1.1 mmol/L (ref 0.5–1.9)

## 2021-09-17 LAB — GLUCOSE, CAPILLARY: Glucose-Capillary: 256 mg/dL — ABNORMAL HIGH (ref 70–99)

## 2021-09-17 MED ORDER — IPRATROPIUM-ALBUTEROL 0.5-2.5 (3) MG/3ML IN SOLN: 3.0000 mL | Freq: Two times a day (BID) | RESPIRATORY_TRACT | Status: DC

## 2021-09-17 MED ORDER — SODIUM CHLORIDE 0.9 % IV SOLN: INTRAVENOUS | Status: AC

## 2021-09-17 MED ORDER — LAMOTRIGINE 100 MG PO TABS
100.0000 mg | ORAL_TABLET | Freq: Every day | ORAL | Status: DC
  Administered 2021-09-18 – 2021-09-20 (×3): 100 mg via ORAL
  Filled 2021-09-17 (×3): qty 1

## 2021-09-17 MED ORDER — INSULIN DETEMIR 100 UNIT/ML ~~LOC~~ SOLN
25.0000 [IU] | Freq: Every day | SUBCUTANEOUS | Status: DC
Start: 2021-09-17 — End: 2021-09-20
  Administered 2021-09-17 – 2021-09-19 (×3): 25 [IU] via SUBCUTANEOUS
  Filled 2021-09-17 (×4): qty 0.25

## 2021-09-17 MED ORDER — ASPIRIN 81 MG PO TBEC
81.0000 mg | DELAYED_RELEASE_TABLET | Freq: Every day | ORAL | Status: DC
  Administered 2021-09-18 – 2021-09-20 (×3): 81 mg via ORAL
  Filled 2021-09-17 (×3): qty 1

## 2021-09-17 MED ORDER — ENOXAPARIN SODIUM 40 MG/0.4ML IJ SOSY
40.0000 mg | PREFILLED_SYRINGE | INTRAMUSCULAR | Status: DC
  Administered 2021-09-18 – 2021-09-20 (×3): 40 mg via SUBCUTANEOUS
  Filled 2021-09-17 (×3): qty 0.4

## 2021-09-17 MED ORDER — ACETAMINOPHEN 325 MG PO TABS
650.0000 mg | ORAL_TABLET | Freq: Four times a day (QID) | ORAL | Status: DC | PRN
  Administered 2021-09-18 (×2): 650 mg via ORAL
  Filled 2021-09-17 (×2): qty 2

## 2021-09-17 MED ORDER — IPRATROPIUM-ALBUTEROL 20-100 MCG/ACT IN AERS: 2.0000 | INHALATION_SPRAY | Freq: Two times a day (BID) | RESPIRATORY_TRACT | Status: DC

## 2021-09-17 MED ORDER — SODIUM CHLORIDE 0.9 % IV BOLUS
1000.0000 mL | Freq: Once | INTRAVENOUS | Status: AC
  Administered 2021-09-17: 1000 mL via INTRAVENOUS

## 2021-09-17 MED ORDER — RISPERIDONE 0.5 MG PO TABS
0.5000 mg | ORAL_TABLET | Freq: Every day | ORAL | Status: DC
  Administered 2021-09-17 – 2021-09-19 (×3): 0.5 mg via ORAL
  Filled 2021-09-17 (×3): qty 1

## 2021-09-17 MED ORDER — ACETAMINOPHEN 650 MG RE SUPP: 650.0000 mg | Freq: Four times a day (QID) | RECTAL | Status: DC | PRN

## 2021-09-17 MED ORDER — INSULIN DETEMIR 100 UNIT/ML FLEXPEN: 25.0000 [IU] | PEN_INJECTOR | Freq: Every day | SUBCUTANEOUS | Status: DC

## 2021-09-17 MED ORDER — FINASTERIDE 5 MG PO TABS
5.0000 mg | ORAL_TABLET | Freq: Every day | ORAL | Status: DC
  Administered 2021-09-18 – 2021-09-20 (×3): 5 mg via ORAL
  Filled 2021-09-17 (×3): qty 1

## 2021-09-17 MED ORDER — ALFUZOSIN HCL ER 10 MG PO TB24
10.0000 mg | ORAL_TABLET | Freq: Every day | ORAL | Status: DC
  Administered 2021-09-18 – 2021-09-20 (×3): 10 mg via ORAL
  Filled 2021-09-17 (×3): qty 1

## 2021-09-17 MED ORDER — ATORVASTATIN CALCIUM 20 MG PO TABS
20.0000 mg | ORAL_TABLET | Freq: Every day | ORAL | Status: DC
  Administered 2021-09-18 – 2021-09-20 (×3): 20 mg via ORAL
  Filled 2021-09-17 (×3): qty 1

## 2021-09-17 MED ORDER — SODIUM CHLORIDE 0.9 % IV BOLUS
500.0000 mL | Freq: Once | INTRAVENOUS | Status: AC
  Administered 2021-09-17: 500 mL via INTRAVENOUS

## 2021-09-17 MED ORDER — INSULIN ASPART 100 UNIT/ML IJ SOLN
0.0000 [IU] | Freq: Every day | INTRAMUSCULAR | Status: DC
  Administered 2021-09-17: 3 [IU] via SUBCUTANEOUS
  Administered 2021-09-18: 2 [IU] via SUBCUTANEOUS

## 2021-09-17 MED ORDER — GLUCERNA SHAKE PO LIQD
237.0000 mL | Freq: Every day | ORAL | Status: DC
  Administered 2021-09-18 – 2021-09-20 (×3): 237 mL via ORAL

## 2021-09-17 MED ORDER — POLYETHYLENE GLYCOL 3350 17 G PO PACK
17.0000 g | PACK | Freq: Every day | ORAL | Status: DC | PRN
Start: 2021-09-17 — End: 2021-09-20

## 2021-09-17 MED ORDER — INSULIN ASPART 100 UNIT/ML IJ SOLN
0.0000 [IU] | Freq: Three times a day (TID) | INTRAMUSCULAR | Status: DC
  Administered 2021-09-18: 5 [IU] via SUBCUTANEOUS
  Administered 2021-09-18: 3 [IU] via SUBCUTANEOUS
  Administered 2021-09-18: 5 [IU] via SUBCUTANEOUS
  Administered 2021-09-19: 3 [IU] via SUBCUTANEOUS
  Administered 2021-09-19: 5 [IU] via SUBCUTANEOUS
  Administered 2021-09-19 – 2021-09-20 (×2): 3 [IU] via SUBCUTANEOUS

## 2021-09-17 MED ORDER — SODIUM CHLORIDE 0.9 % IV SOLN
1.0000 g | Freq: Once | INTRAVENOUS | Status: AC
  Administered 2021-09-17: 1 g via INTRAVENOUS
  Filled 2021-09-17: qty 10

## 2021-09-17 MED ORDER — ONDANSETRON HCL 4 MG/2ML IJ SOLN: 4.0000 mg | Freq: Four times a day (QID) | INTRAMUSCULAR | Status: DC | PRN

## 2021-09-17 MED ORDER — SODIUM CHLORIDE 0.9 % IV SOLN
1.0000 g | INTRAVENOUS | Status: DC
  Administered 2021-09-18 – 2021-09-19 (×2): 1 g via INTRAVENOUS
  Filled 2021-09-17 (×2): qty 10

## 2021-09-17 MED ORDER — INSULIN ASPART 100 UNIT/ML IJ SOLN
10.0000 [IU] | Freq: Once | INTRAMUSCULAR | Status: AC
  Administered 2021-09-17: 10 [IU] via SUBCUTANEOUS
  Filled 2021-09-17: qty 1

## 2021-09-17 MED ORDER — CEPHALEXIN 500 MG PO CAPS: 500.0000 mg | ORAL_CAPSULE | Freq: Four times a day (QID) | ORAL | 0 refills | Status: DC

## 2021-09-17 MED ORDER — ONDANSETRON HCL 4 MG PO TABS: 4.0000 mg | ORAL_TABLET | Freq: Four times a day (QID) | ORAL | Status: DC | PRN

## 2021-09-17 MED ORDER — CITALOPRAM HYDROBROMIDE 20 MG PO TABS
20.0000 mg | ORAL_TABLET | Freq: Every day | ORAL | Status: DC
  Administered 2021-09-17 – 2021-09-19 (×3): 20 mg via ORAL
  Filled 2021-09-17 (×3): qty 1

## 2021-09-17 NOTE — ED Triage Notes (Signed)
Pt BIB [facility] from [facility] with reports of hyperglycemia and dysuria.

## 2021-09-17 NOTE — H&P (Signed)
History and Physical    MURPHY MARG WUJ:[id number] DOB: 1945-04-01 DOA: 09/17/2021  PCP: Ignatius Specking, MD   Patient coming from: [facility]  I have personally briefly reviewed patient's old medical records in [facility]  Chief Complaint: Hyperglycemia and dysuria  HPI: Douglas Cook is a 76 y.o. male with medical history significant for COPD, diabetes mellitus, depression, hypertension, CKD. Patient presented to ED via EMS with reports of hyperglycemia and dysuria.  Blood sugar in the 400s.  Patient tells me has been having pain with urination over the past 2 days.  He also tells me has had a dry cough.  No difficulty breathing.  No chest or abdominal pain.  No vomiting no loose stools.  ED Course: T- max-99.3.  Heart rate initially 94.  Now up to 119 on my evaluation, respiratory rate 17-23, blood pressure systolic 104-153. WBC 8.3.  Creatinine at baseline 1.85.  UA with large leukocytes.  Blood glucose 369. And Rocephin given, 1 L bolus given With increasing tachycardia, and concern for possible early or developing sepsis, hospitalist was called to admit.  Blood cultures and lactic acid were subsequently obtained.  Review of Systems: As per HPI all other systems reviewed and negative.  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 His smoking use  included pipe. He has never used smokeless tobacco. He reports that he does not drink alcohol and does not use drugs.  No Known Allergies  Family History  Problem Relation Age of Onset   Stroke Mother    Diabetes Father     Prior to Admission medications   Medication Sig Start Date End Date Taking? Authorizing Provider  acetaminophen (TYLENOL) 325 MG tablet Take 2 tablets (650 mg total) by mouth every 6 (six) hours as needed for mild pain (or Fever >/= 101). 03/22/21  Yes Sharee Holster, NP  albuterol (VENTOLIN HFA) 108 (90 Base) MCG/ACT inhaler Inhale 1-2 puffs into the lungs every 4 (four) hours as needed for wheezing or shortness of breath.   Yes [provider]  alfuzosin (UROXATRAL) 10 MG 24 hr tablet TAKE (1) TABLET BY MOUTH ONCE DAILY. Patient taking differently: Take 10 mg by mouth daily with breakfast. 08/29/21  Yes Stoneking, Danford Bad., MD  Alpha-Lipoic Acid 100 MG CAPS Take 300 mg by mouth daily. 08/30/21  Yes [provider]  aspirin EC 81 MG tablet Take 1 tablet (81 mg total) by mouth daily. Swallow whole. 03/22/21  Yes Sharee Holster, NP  atorvastatin (LIPITOR) 20 MG tablet Take 1 tablet (20 mg total) by mouth daily. 03/22/21  Yes Sharee Holster, NP  cephALEXin (KEFLEX) 500 MG capsule Take 1 capsule (500 mg total) by mouth 4 (four) times daily. 09/17/21  Yes Vanetta Mulders, MD  cetirizine (ZYRTEC) 10 MG  tablet Take 1 tablet (10 mg total) by mouth at bedtime. 03/22/21  Yes Sharee Holster, NP  cholecalciferol (VITAMIN D) 25 MCG (1000 UNIT) tablet Take 1 tablet (1,000 Units total) by mouth daily. 03/22/21  Yes Sharee Holster, NP  citalopram (CELEXA) 20 MG tablet Take 1 tablet (20 mg total) by mouth at bedtime. 03/22/21  Yes Sharee Holster, NP  feeding supplement, GLUCERNA SHAKE, (GLUCERNA SHAKE) LIQD Take 237 mLs by mouth daily. 03/22/21  Yes Sharee Holster, NP  FEROSUL 325 (65 Fe) MG tablet Take 1 tablet (325 mg total) by mouth 2 (two) times daily.  03/22/21  Yes Sharee Holster, NP  finasteride (PROSCAR) 5 MG tablet Take 5 mg by mouth daily. 09/09/21  Yes [provider]  fluticasone (FLONASE) 50 MCG/ACT nasal spray Place 1 spray into both nostrils daily. 03/22/21  Yes Sharee Holster, NP  Ipratropium-Albuterol (COMBIVENT RESPIMAT) 20-100 MCG/ACT AERS respimat Inhale 2 puffs into the lungs 2 (two) times daily. 03/22/21  Yes Sharee Holster, NP  lamoTRIgine (LAMICTAL) 100 MG tablet Take 1 tablet (100 mg total) by mouth daily. 03/22/21  Yes Sharee Holster, NP  LEVEMIR FLEXTOUCH 100 UNIT/ML FlexTouch Pen Inject 30 Units into the skin at bedtime. Patient taking differently: Inject 50 Units into the skin at bedtime. 03/22/21  Yes Sharee Holster, NP  metFORMIN (GLUCOPHAGE-XR) 500 MG 24 hr tablet Take 2 tablets (1,000 mg total) by mouth daily with breakfast. 03/22/21  Yes Sharee Holster, NP  NOVOLOG FLEXPEN 100 UNIT/ML FlexPen Inject 8 Units into the skin 3 (three) times daily. 03/22/21  Yes [provider]  omeprazole (PRILOSEC) 20 MG capsule Take 1 capsule (20 mg total) by mouth daily. 03/22/21  Yes Sharee Holster, NP  Propylene Glycol (SYSTANE BALANCE) 0.6 % SOLN Apply 1 drop to eye 2 (two) times daily. Both eyes 03/22/21  Yes Sharee Holster, NP  risperiDONE (RISPERDAL) 0.5 MG tablet Take 1 tablet (0.5 mg total) by mouth at bedtime. 03/22/21  Yes Sharee Holster, NP  senna-docusate (SENOKOT-S) 8.6-50 MG tablet Take 1 tablet by mouth at bedtime as needed for mild constipation. 03/22/21  Yes Sharee Holster, NP  TRADJENTA 5 MG TABS tablet Take 1 tablet (5 mg total) by mouth daily. 03/22/21  Yes Sharee Holster, NP  traZODone (DESYREL) 100 MG tablet Take 1 tablet (100 mg total) by mouth at bedtime. 03/22/21  Yes Sharee Holster, NP  Alpha-Lipoic Acid 300 MG CAPS Take 1 capsule (300 mg total) by mouth daily at 12 noon. At 10 pm Patient not taking: Reported on 09/17/2021 03/22/21   Sharee Holster, NP  cefdinir  (OMNICEF) 300 MG capsule Take 1 capsule (300 mg total) by mouth every 12 (twelve) hours. 06/20/21   Catarina Hartshorn, MD  cephALEXin (KEFLEX) 500 MG capsule Take 1 capsule (500 mg total) by mouth 4 (four) times daily. Patient not taking: Reported on 09/17/2021 08/15/21   Marily Memos, MD    Physical Exam: Vitals:   09/17/21 1500 09/17/21 1530 09/17/21 1600 09/17/21 1829  BP: 119/68 108/64 130/81 (!) 153/74  Pulse: (!) 102 (!) 106 (!) 105 (!) 112  Resp: 18 19 16  (!) 23  Temp:    99.3 F (37.4 C)  TempSrc:    Rectal  SpO2: 97% 100% 100% 99%    Constitutional: NAD, calm, comfortable Vitals:   09/17/21 1500 09/17/21 1530 09/17/21 1600 09/17/21 1829  BP: 119/68 108/64 130/81 (!) 153/74  Pulse: Marland Kitchen)  102 (!) 106 (!) 105 (!) 112  Resp: 18 19 16  (!) 23  Temp:    99.3 F (37.4 C)  TempSrc:    Rectal  SpO2: 97% 100% 100% 99%   Eyes: PERRL, lids and conjunctivae normal ENMT: Mucous membranes are moist.   Neck: normal, supple, no masses, no thyromegaly Respiratory: clear to auscultation bilaterally, no wheezing, no crackles.  Cardiovascular: Regular rate and rhythm, no murmurs / rubs / gallops. No extremity edema.  Abdomen: no tenderness, no masses palpated. No hepatosplenomegaly. Bowel sounds positive.  Musculoskeletal: no clubbing / cyanosis. No joint deformity upper and lower extremities.  Skin: no rashes, lesions, ulcers. No induration Neurologic: No apparent cranial nerve abnormality moving all extremities spontaneously Psychiatric: Normal judgment and insight. Alert and oriented x 3. Normal mood.   Labs on Admission: I have personally reviewed following labs and imaging studies  CBC: Recent Labs  Lab 09/17/21 1308  WBC 8.3  HGB 11.9*  HCT 36.9*  MCV 91.6  PLT 223   Basic Metabolic Panel: Recent Labs  Lab 09/17/21 1308  NA 133*  K 4.2  CL 97*  CO2 26  GLUCOSE 369*  BUN 34*  CREATININE 1.85*  CALCIUM 9.3   GFR: CrCl cannot be calculated (Unknown ideal weight.). Liver  Function Tests: Recent Labs  Lab 09/17/21 1308  AST 13*  ALT 19  ALKPHOS 60  BILITOT 0.4  PROT 8.0  ALBUMIN 3.7   CBG: Recent Labs  Lab 09/17/21 1232 09/17/21 1552  GLUCAP 427* 282*   Urine analysis:    Component Value Date/Time   COLORURINE YELLOW 09/17/2021 1308   APPEARANCEUR CLOUDY (A) 09/17/2021 1308   LABSPEC 1.018 09/17/2021 1308   PHURINE 8.0 09/17/2021 1308   GLUCOSEU >=500 (A) 09/17/2021 1308   HGBUR NEGATIVE 09/17/2021 1308   BILIRUBINUR NEGATIVE 09/17/2021 1308   KETONESUR NEGATIVE 09/17/2021 1308   PROTEINUR 100 (A) 09/17/2021 1308   NITRITE NEGATIVE 09/17/2021 1308   LEUKOCYTESUR LARGE (A) 09/17/2021 1308    Radiological Exams on Admission: No results found.  EKG: Pending  Assessment/Plan Principal Problem:   UTI (urinary tract infection) Active Problems:   Hyperglycemia   COPD (chronic obstructive pulmonary disease) (HCC)   Depression   HTN (hypertension)   CKD stage 3 due to type 2 diabetes mellitus (HCC)   Major neurocognitive disorder (HCC)   Assessment and Plan: * UTI (urinary tract infection) Reports urinary symptoms of dysuria.  Tmax 99.3.  Heart rate up to 119 on my evaluation.  Lactic acid 2.3.  Not quite meeting sepsis criteria at this time.  WBC 8.3. -IV Rocephin 1 g daily -Reports cough, obtain chest x-ray-unremarkable -1.5 L bolus given -Trend lactic acid -Follow-up urine cultures -Follow-up blood cultures obtained after antibiotics   Hyperglycemia Blood sugars 369, in the 400s prior to admission.  - SSI- M -Resume home Levemir at 25 units nightly (home dose 30 units) -Hold Tradjenta, metformin  CKD stage 3 due to type 2 diabetes mellitus (HCC) CKD 3B.  Creatinine 1.8, baseline 1.4-1.8.  Controlled, A1c 7.  HTN (hypertension) Stable.  Depression Resume risperidone, lamotrigine, citalopram  COPD (chronic obstructive pulmonary disease) (HCC) Stable. -Resume home regimen   DVT prophylaxis: Lovenox Code Status:  Full Family Communication: None at bedside Disposition Plan: ~ 1 -2 days Consults called:  None Admission status:  Obs tele  Author: Onnie Boer, MD 09/17/2021 8:57 PM  For on call review [email or web address].

## 2021-09-17 NOTE — Assessment & Plan Note (Signed)
Stable.  Resume home regimen 

## 2021-09-17 NOTE — ED Provider Notes (Signed)
[facility] EMERGENCY DEPARTMENT Provider Note   CSN: [id number] Arrival date & time: 09/17/21  1223     History  Chief Complaint  Patient presents with   Hyperglycemia    Douglas Cook is a 76 y.o. male.  Patient sent in from [facility] [place] [facility] for elevated blood sugar and dysuria.  Patient has a history of COPD diabetes chronic kidney disease stage III.  Patient has been followed recently by urology here in [place] for history of urinary retention history of urinary tract infections.  Patient was seen on May 25 with discomfort associated with Foley catheter and he was started on cefdinir urine culture at that time had no growth.  His Foley was removed on May 31.  Seen by urology outpatient on June 20 and he has been voiding spontaneously with a catheter removed.  He is continued on alpha Zosyn.  Patient without any specific complaints today.       Home Medications Prior to Admission medications   Medication Sig Start Date End Date Taking? Authorizing Provider  acetaminophen (TYLENOL) 325 MG tablet Take 2 tablets (650 mg total) by mouth every 6 (six) hours as needed for mild pain (or Fever >/= 101). 03/22/21   Sharee Holster, NP  albuterol (VENTOLIN HFA) 108 (90 Base) MCG/ACT inhaler Inhale 1-2 puffs into the lungs every 4 (four) hours as needed for wheezing or shortness of breath.    [provider]  alfuzosin (UROXATRAL) 10 MG 24 hr tablet TAKE (1) TABLET BY MOUTH ONCE DAILY. 08/29/21   Stoneking, Danford Bad., MD  Alpha-Lipoic Acid 300 MG CAPS Take 1 capsule (300 mg total) by mouth daily at 12 noon. At 10 pm 03/22/21   Sharee Holster, NP  aspirin EC 81 MG tablet Take 1 tablet (81 mg total) by mouth daily. Swallow whole. 03/22/21   Sharee Holster, NP  atorvastatin (LIPITOR) 20 MG tablet Take 1 tablet (20 mg total) by mouth daily. 03/22/21   Sharee Holster, NP  cefdinir (OMNICEF) 300 MG capsule Take 1 capsule (300 mg total) by mouth every 12 (twelve)  hours. 06/20/21   Catarina Hartshorn, MD  cephALEXin (KEFLEX) 500 MG capsule Take 1 capsule (500 mg total) by mouth 4 (four) times daily. 08/15/21   Mesner, Barbara Cower, MD  cetirizine (ZYRTEC) 10 MG tablet Take 1 tablet (10 mg total) by mouth at bedtime. 03/22/21   Sharee Holster, NP  cholecalciferol (VITAMIN D) 25 MCG (1000 UNIT) tablet Take 1 tablet (1,000 Units total) by mouth daily. 03/22/21   Sharee Holster, NP  citalopram (CELEXA) 20 MG tablet Take 1 tablet (20 mg total) by mouth at bedtime. 03/22/21   Sharee Holster, NP  feeding supplement, GLUCERNA SHAKE, (GLUCERNA SHAKE) LIQD Take 237 mLs by mouth daily. 03/22/21   Sharee Holster, NP  FEROSUL 325 (65 Fe) MG tablet Take 1 tablet (325 mg total) by mouth 2 (two) times daily. 03/22/21   Sharee Holster, NP  fluticasone (FLONASE) 50 MCG/ACT nasal spray Place 1 spray into both nostrils daily. 03/22/21   Sharee Holster, NP  Ipratropium-Albuterol (COMBIVENT RESPIMAT) 20-100 MCG/ACT AERS respimat Inhale 2 puffs into the lungs 2 (two) times daily. 03/22/21   Sharee Holster, NP  lamoTRIgine (LAMICTAL) 100 MG tablet Take 1 tablet (100 mg total) by mouth daily. 03/22/21   Sharee Holster, NP  LEVEMIR FLEXTOUCH 100 UNIT/ML FlexTouch Pen Inject 30 Units into the skin at bedtime. 03/22/21   Synthia Innocent  S, NP  metFORMIN (GLUCOPHAGE-XR) 500 MG 24 hr tablet Take 2 tablets (1,000 mg total) by mouth daily with breakfast. 03/22/21   Sharee Holster, NP  NOVOLOG FLEXPEN 100 UNIT/ML FlexPen Inject 8 Units into the skin 3 (three) times daily. 03/22/21   [provider]  omeprazole (PRILOSEC) 20 MG capsule Take 1 capsule (20 mg total) by mouth daily. 03/22/21   Sharee Holster, NP  Propylene Glycol (SYSTANE BALANCE) 0.6 % SOLN Apply 1 drop to eye 2 (two) times daily. Both eyes 03/22/21   Sharee Holster, NP  risperiDONE (RISPERDAL) 0.5 MG tablet Take 1 tablet (0.5 mg total) by mouth at bedtime. 03/22/21   Sharee Holster, NP  senna-docusate (SENOKOT-S)  8.6-50 MG tablet Take 1 tablet by mouth at bedtime as needed for mild constipation. 03/22/21   Sharee Holster, NP  TRADJENTA 5 MG TABS tablet Take 1 tablet (5 mg total) by mouth daily. 03/22/21   Sharee Holster, NP  traZODone (DESYREL) 100 MG tablet Take 1 tablet (100 mg total) by mouth at bedtime. 03/22/21   Sharee Holster, NP      Allergies    Patient has no known allergies.    Review of Systems   Review of Systems  Constitutional:  Negative for chills and fever.  HENT:  Negative for ear pain and sore throat.   Eyes:  Negative for pain and visual disturbance.  Respiratory:  Negative for cough and shortness of breath.   Cardiovascular:  Negative for chest pain and palpitations.  Gastrointestinal:  Negative for abdominal pain and vomiting.  Genitourinary:  Negative for dysuria and hematuria.  Musculoskeletal:  Negative for arthralgias and back pain.  Skin:  Negative for color change and rash.  Neurological:  Negative for seizures and syncope.  All other systems reviewed and are negative.   Physical Exam Updated Vital Signs BP 104/61   Pulse 94   Temp 98.6 F (37 C) (Oral)   Resp 17   SpO2 98%  Physical Exam Vitals and nursing note reviewed.  Constitutional:      General: He is not in acute distress.    Appearance: Normal appearance. He is well-developed. He is not ill-appearing or toxic-appearing.  HENT:     Head: Normocephalic and atraumatic.  Eyes:     Conjunctiva/sclera: Conjunctivae normal.  Cardiovascular:     Rate and Rhythm: Normal rate and regular rhythm.     Heart sounds: No murmur heard. Pulmonary:     Effort: Pulmonary effort is normal. No respiratory distress.     Breath sounds: Normal breath sounds.  Abdominal:     General: There is distension.     Palpations: Abdomen is soft.     Tenderness: There is no abdominal tenderness.  Musculoskeletal:        General: No swelling.     Cervical back: Neck supple.  Skin:    General: Skin is warm and dry.      Capillary Refill: Capillary refill takes less than 2 seconds.  Neurological:     Mental Status: He is alert. Mental status is at baseline.     ED Results / Procedures / Treatments   Labs (all labs ordered are listed, but only abnormal results are displayed) Labs Reviewed  CBC - Abnormal; Notable for the following components:      Result Value   RBC 4.03 (*)    Hemoglobin 11.9 (*)    HCT 36.9 (*)    All other components  within normal limits  COMPREHENSIVE METABOLIC PANEL - Abnormal; Notable for the following components:   Sodium 133 (*)    Chloride 97 (*)    Glucose, Bld 369 (*)    BUN 34 (*)    Creatinine, Ser 1.85 (*)    AST 13 (*)    GFR, Estimated 38 (*)    All other components within normal limits  URINALYSIS, ROUTINE W REFLEX MICROSCOPIC - Abnormal; Notable for the following components:   APPearance CLOUDY (*)    Glucose, UA >=500 (*)    Protein, ur 100 (*)    Leukocytes,Ua LARGE (*)    WBC, UA >50 (*)    Non Squamous Epithelial 0-5 (*)    All other components within normal limits  CBG MONITORING, ED - Abnormal; Notable for the following components:   Glucose-Capillary 427 (*)    All other components within normal limits  URINE CULTURE    EKG None  Radiology No results found.  Procedures Procedures    Medications Ordered in ED Medications  cefTRIAXone (ROCEPHIN) 1 g in sodium chloride 0.9 % 100 mL IVPB (has no administration in time range)  insulin aspart (novoLOG) injection 10 Units (has no administration in time range)    ED Course/ Medical Decision Making/ A&P                           Medical Decision Making Amount and/or Complexity of Data Reviewed Labs: ordered.  Risk Prescription drug management.  Patient blood sugar low elevated here 369.  No signs of acidosis.  Potassium is good at 4.2.  GFR 38 patient known to have chronic kidney disease.  Urinalysis cloudy leukocytes with large white blood cells greater than 50 no bacteria.   However patient has not had a Foley catheter in place.  We will send this for urine culture we will treat him with Rocephin here may be continue Keflex at home pending the culture.  Fingerstick blood sugar was 427 when he first came in.  But the lab is more reassuring CBC no leukocytosis hemoglobin 11.9.  And platelets 223.  Urinalysis here concerning for urinary tract infection have sent for culture we will give a gram of Rocephin here.  We will give subcu insulin 10 units and patient probably can return back to nursing facility with follow-up with urology.  Patient has been being seen by Dr. Pete Glatter from urology here.  Final Clinical Impression(s) / ED Diagnoses Final diagnoses:  Hyperglycemia  Acute cystitis without hematuria    Rx / DC Orders ED Discharge Orders     None         Vanetta Mulders, MD 09/17/21 1407

## 2021-09-17 NOTE — Assessment & Plan Note (Addendum)
Blood sugars 369, in the 400s prior to admission.  - SSI- M -Resume home Levemir at 25 units nightly (home dose 30 units) -Hold Tradjenta, metformin

## 2021-09-18 ENCOUNTER — Encounter (HOSPITAL_COMMUNITY): Payer: Self-pay | Admitting: Internal Medicine

## 2021-09-18 DIAGNOSIS — Z96642 Presence of left artificial hip joint: Secondary | ICD-10-CM | POA: Diagnosis present

## 2021-09-18 DIAGNOSIS — E1165 Type 2 diabetes mellitus with hyperglycemia: Secondary | ICD-10-CM | POA: Diagnosis present

## 2021-09-18 DIAGNOSIS — J449 Chronic obstructive pulmonary disease, unspecified: Secondary | ICD-10-CM

## 2021-09-18 DIAGNOSIS — Z7951 Long term (current) use of inhaled steroids: Secondary | ICD-10-CM | POA: Diagnosis not present

## 2021-09-18 DIAGNOSIS — I129 Hypertensive chronic kidney disease with stage 1 through stage 4 chronic kidney disease, or unspecified chronic kidney disease: Secondary | ICD-10-CM | POA: Diagnosis present

## 2021-09-18 DIAGNOSIS — N390 Urinary tract infection, site not specified: Secondary | ICD-10-CM | POA: Diagnosis present

## 2021-09-18 DIAGNOSIS — Z7984 Long term (current) use of oral hypoglycemic drugs: Secondary | ICD-10-CM | POA: Diagnosis not present

## 2021-09-18 DIAGNOSIS — N1832 Chronic kidney disease, stage 3b: Secondary | ICD-10-CM | POA: Diagnosis present

## 2021-09-18 DIAGNOSIS — Z794 Long term (current) use of insulin: Secondary | ICD-10-CM | POA: Diagnosis not present

## 2021-09-18 DIAGNOSIS — Z79899 Other long term (current) drug therapy: Secondary | ICD-10-CM | POA: Diagnosis not present

## 2021-09-18 DIAGNOSIS — A4159 Other Gram-negative sepsis: Secondary | ICD-10-CM | POA: Diagnosis present

## 2021-09-18 DIAGNOSIS — E785 Hyperlipidemia, unspecified: Secondary | ICD-10-CM | POA: Diagnosis present

## 2021-09-18 DIAGNOSIS — Z7982 Long term (current) use of aspirin: Secondary | ICD-10-CM | POA: Diagnosis not present

## 2021-09-18 DIAGNOSIS — Z833 Family history of diabetes mellitus: Secondary | ICD-10-CM | POA: Diagnosis not present

## 2021-09-18 DIAGNOSIS — Z87440 Personal history of urinary (tract) infections: Secondary | ICD-10-CM | POA: Diagnosis not present

## 2021-09-18 DIAGNOSIS — D631 Anemia in chronic kidney disease: Secondary | ICD-10-CM | POA: Diagnosis present

## 2021-09-18 DIAGNOSIS — R739 Hyperglycemia, unspecified: Secondary | ICD-10-CM | POA: Diagnosis not present

## 2021-09-18 DIAGNOSIS — K219 Gastro-esophageal reflux disease without esophagitis: Secondary | ICD-10-CM | POA: Diagnosis present

## 2021-09-18 DIAGNOSIS — I10 Essential (primary) hypertension: Secondary | ICD-10-CM | POA: Diagnosis not present

## 2021-09-18 DIAGNOSIS — M1990 Unspecified osteoarthritis, unspecified site: Secondary | ICD-10-CM | POA: Diagnosis present

## 2021-09-18 DIAGNOSIS — E1122 Type 2 diabetes mellitus with diabetic chronic kidney disease: Secondary | ICD-10-CM | POA: Diagnosis present

## 2021-09-18 DIAGNOSIS — R319 Hematuria, unspecified: Secondary | ICD-10-CM | POA: Diagnosis not present

## 2021-09-18 DIAGNOSIS — F0393 Unspecified dementia, unspecified severity, with mood disturbance: Secondary | ICD-10-CM | POA: Diagnosis present

## 2021-09-18 LAB — GLUCOSE, CAPILLARY
Glucose-Capillary: 203 mg/dL — ABNORMAL HIGH (ref 70–99)
Glucose-Capillary: 162 mg/dL — ABNORMAL HIGH (ref 70–99)
Glucose-Capillary: 222 mg/dL — ABNORMAL HIGH (ref 70–99)
Glucose-Capillary: 217 mg/dL — ABNORMAL HIGH (ref 70–99)

## 2021-09-18 LAB — BASIC METABOLIC PANEL
Anion gap: 7 (ref 5–15)
BUN: 29 mg/dL — ABNORMAL HIGH (ref 8–23)
CO2: 26 mmol/L (ref 22–32)
Calcium: 8.5 mg/dL — ABNORMAL LOW (ref 8.9–10.3)
Chloride: 104 mmol/L (ref 98–111)
Creatinine, Ser: 1.63 mg/dL — ABNORMAL HIGH (ref 0.61–1.24)
GFR, Estimated: 44 mL/min — ABNORMAL LOW (ref >60)
Glucose, Bld: 222 mg/dL — ABNORMAL HIGH (ref 70–99)
Potassium: 4.2 mmol/L (ref 3.5–5.1)
Sodium: 137 mmol/L (ref 135–145)

## 2021-09-18 LAB — CBC
HCT: 31.2 % — ABNORMAL LOW (ref 39.0–52.0)
Hemoglobin: 10 g/dL — ABNORMAL LOW (ref 13.0–17.0)
MCH: 29.4 pg (ref 26.0–34.0)
MCHC: 32.1 g/dL (ref 30.0–36.0)
MCV: 91.8 fL (ref 80.0–100.0)
Platelets: 191 10*3/uL (ref 150–400)
RBC: 3.4 MIL/uL — ABNORMAL LOW (ref 4.22–5.81)
RDW: 13.5 % (ref 11.5–15.5)
WBC: 5.9 10*3/uL (ref 4.0–10.5)
nRBC: 0 % (ref 0.0–0.2)

## 2021-09-18 MED ORDER — IPRATROPIUM-ALBUTEROL 0.5-2.5 (3) MG/3ML IN SOLN: 3.0000 mL | Freq: Four times a day (QID) | RESPIRATORY_TRACT | Status: DC | PRN

## 2021-09-18 NOTE — TOC Progression Note (Signed)
Transition of Care [facility]) - Progression Note    Patient Details  Name: Douglas Cook MRN: [id number] Date of Birth: 04/05/1945  [facility] CM/SW Contact  Leitha Bleak, RN Phone Number: 09/18/2021, 12:57 PM  Clinical Narrative:   Patient from [facility], admitted with UTI( Sepsis). TOC to follow for any new news.   Expected Discharge Plan: Assisted Living Barriers to Discharge: Continued Medical Work up  Expected Discharge Plan and Services Expected Discharge Plan: Assisted Living      Readmission Risk Interventions    06/20/2021   10:42 AM 06/19/2021   10:14 AM 05/10/2021    9:38 AM  Readmission Risk Prevention Plan  Transportation Screening Complete Complete Complete  HRI or Home Care Consult   Complete  Social Work Consult for Recovery Care Planning/Counseling   Complete  Palliative Care Screening   Not Applicable  Medication Review [employer]) Complete Complete Complete  PCP or Specialist appointment within 3-5 days of discharge Complete    HRI or Home Care Consult Complete Complete   SW Recovery Care/Counseling Consult Complete Complete   Palliative Care Screening Not Applicable Not Applicable   Skilled Nursing Facility Not Applicable Not Applicable

## 2021-09-19 DIAGNOSIS — J449 Chronic obstructive pulmonary disease, unspecified: Secondary | ICD-10-CM | POA: Diagnosis not present

## 2021-09-19 DIAGNOSIS — I10 Essential (primary) hypertension: Secondary | ICD-10-CM | POA: Diagnosis not present

## 2021-09-19 DIAGNOSIS — N390 Urinary tract infection, site not specified: Secondary | ICD-10-CM | POA: Diagnosis not present

## 2021-09-19 DIAGNOSIS — E1122 Type 2 diabetes mellitus with diabetic chronic kidney disease: Secondary | ICD-10-CM | POA: Diagnosis not present

## 2021-09-19 DIAGNOSIS — F0390 Unspecified dementia without behavioral disturbance: Secondary | ICD-10-CM

## 2021-09-19 LAB — BLOOD CULTURE ID PANEL (REFLEXED) - BCID2

## 2021-09-19 LAB — BASIC METABOLIC PANEL
Anion gap: 8 (ref 5–15)
BUN: 26 mg/dL — ABNORMAL HIGH (ref 8–23)
CO2: 27 mmol/L (ref 22–32)
Calcium: 8.8 mg/dL — ABNORMAL LOW (ref 8.9–10.3)
Chloride: 103 mmol/L (ref 98–111)
Creatinine, Ser: 1.57 mg/dL — ABNORMAL HIGH (ref 0.61–1.24)
GFR, Estimated: 46 mL/min — ABNORMAL LOW (ref >60)
Glucose, Bld: 158 mg/dL — ABNORMAL HIGH (ref 70–99)
Potassium: 4.1 mmol/L (ref 3.5–5.1)
Sodium: 138 mmol/L (ref 135–145)

## 2021-09-19 LAB — CBC
HCT: 32.1 % — ABNORMAL LOW (ref 39.0–52.0)
Hemoglobin: 10.2 g/dL — ABNORMAL LOW (ref 13.0–17.0)
MCH: 29.6 pg (ref 26.0–34.0)
MCHC: 31.8 g/dL (ref 30.0–36.0)
MCV: 93 fL (ref 80.0–100.0)
Platelets: 200 10*3/uL (ref 150–400)
RBC: 3.45 MIL/uL — ABNORMAL LOW (ref 4.22–5.81)
RDW: 13.2 % (ref 11.5–15.5)
WBC: 6.9 10*3/uL (ref 4.0–10.5)
nRBC: 0 % (ref 0.0–0.2)

## 2021-09-19 LAB — GLUCOSE, CAPILLARY
Glucose-Capillary: 165 mg/dL — ABNORMAL HIGH (ref 70–99)
Glucose-Capillary: 243 mg/dL — ABNORMAL HIGH (ref 70–99)
Glucose-Capillary: 176 mg/dL — ABNORMAL HIGH (ref 70–99)
Glucose-Capillary: 207 mg/dL — ABNORMAL HIGH (ref 70–99)

## 2021-09-19 NOTE — Progress Notes (Signed)
PROGRESS NOTE    Patient: Douglas Cook                            PCP: Ignatius Specking, MD                    DOB: 11-16-1945            DOA: 09/17/2021 ZOX:[id number]             DOS: 09/19/2021, 1:33 PM   LOS: 1 day   Date of Service: The patient was seen and examined on 09/19/2021  Subjective:   The patient was seen and examined this morning, stable no acute distress Blood pressure running low but has stabilized, satting 93% on room air    Brief Narrative:   Douglas Cook is a 76 y.o. male with medical history significant for COPD, diabetes mellitus, depression, hypertension, CKD. Patient presented to ED via EMS with reports of hyperglycemia and dysuria.  Blood sugar in the 400s.  Patient tells me has been having pain with urination over the past 2 days.  He also tells me has had a dry cough.  No difficulty breathing.  No chest or abdominal pain.  No vomiting no loose stools.   ED Course: T- max-101.5  HR 94. Now 119, RR 17-23, BP 104-153. WBC 8.3.  Creatinine at baseline 1.85.  UA with large leukocytes.  Blood glucose 369. And Rocephin given, 1 L bolus given With increasing tachycardia, and concern for possible early or developing sepsis, hospitalist was called to admit.  Blood cultures and lactic acid were subsequently obtained.    Assessment & Plan:   Principal Problem:   UTI (urinary tract infection) Active Problems:   Hyperglycemia   COPD (chronic obstructive pulmonary disease) (HCC)   Depression   HTN (hypertension)   CKD stage 3 due to type 2 diabetes mellitus (HCC)   Major neurocognitive disorder (HCC)   Urinary tract infection  Assessment and Plan:   Sepsis-due to UTI (urinary tract infection) -Improved sepsis physiology Blood pressure (!) 105/57, pulse 87, temperature 98.1 F (36.7 C), temperature source Oral, resp. rate 18, SpO2 93 % room air  -This was ruled in with Tmax of 101.5, heart rate of 119, lactic acid 2.3, compromise -renal-with creatinine  1.85   -Continue IV Rocephin 1 g daily -Reports cough, obtain chest x-ray-unremarkable -1.5 L bolus given -Lactic acid 2.3 >>> 1.1 -Urine culture no growth to date -Blood cultures initially grew gram-positive cocci, staph epididymis-likely contaminant -Repeat blood cultures >>>>  -Blood cultures obtained after antibiotics   (Blood cultures likely contaminant, if repeat blood cultures are negative, likely will switch to antibiotics to p.o. and discharged home)  Hyperglycemia Blood sugars 369, in the 400s prior to admission.  -Checking CBG QA CHS, SSI coverage.  CBG (last 3)  Recent Labs    09/18/21 2110 09/19/21 0723 09/19/21 1112  GLUCAP 217* 165* 243*     -Resume home Levemir at 25 units nightly (home dose 30 units) -Hold Tradjenta, metformin   CKD stage 3 due to type 2 diabetes mellitus (HCC) Improving  CKD 3B.  Creatinine 1.8, baseline 1.4-1.8.  Controlled, A1c 7. Lab Results  Component Value Date   CREATININE 1.57 (H) 09/19/2021   CREATININE 1.63 (H) 09/18/2021   CREATININE 1.85 (H) 09/17/2021   HTN (hypertension) Stable.   Depression Resume risperidone, lamotrigine, citalopram   COPD (chronic obstructive pulmonary disease) (HCC) Remained stable. -  Satting 95% on room air -Resume home regimen     ----------------------------------------------------------------------------------------------------------------------------------------------- Nutritional status:  The patient's BMI is: Body mass index is 28.4 kg/m. I agree with the assessment and plan as outlined ----------------------------------------------------------------------------------------------------------------------------------------------- Cultures; Blood Cultures x 2 >> gram-positive cocci/staph epididymis  --likely contaminant Blood cultures >>>  Urine Culture  >>> NGT      ----------------------------------------------------------------------------------------------------------------------------------------  DVT prophylaxis:  enoxaparin (LOVENOX) injection 40 mg Start: 09/18/21 1000   Code Status:   Code Status: Full Code  Family Communication: No family member present at bedside- attempt will be made to update daily The above findings and plan of care has been discussed with patient (and family)  in detail,  they expressed understanding and agreement of above. -Advance care planning has been discussed.   Disposition: If repeat blood cultures are negative by 09/20/2021 can be discharged back to group home on p.o. antibiotics   admission status:   Status is: Inpatient Remain inpatient treatment of SIRS, UTI, ruling out bacteremia, needing IV fluids and antibiotics     Procedures:   No admission procedures for hospital encounter.   Antimicrobials:  Anti-infectives (From admission, onward)    Start     Dose/Rate Route Frequency Ordered Stop   09/18/21 1400  cefTRIAXone (ROCEPHIN) 1 g in sodium chloride 0.9 % 100 mL IVPB        1 g 200 mL/hr over 30 Minutes Intravenous Every 24 hours 09/17/21 2148     09/17/21 1415  cefTRIAXone (ROCEPHIN) 1 g in sodium chloride 0.9 % 100 mL IVPB        1 g 200 mL/hr over 30 Minutes Intravenous  Once 09/17/21 1401 09/17/21 1537   09/17/21 0000  cephALEXin (KEFLEX) 500 MG capsule        500 mg Oral 4 times daily 09/17/21 1457          Medication:   alfuzosin  10 mg Oral Q breakfast   aspirin EC  81 mg Oral Daily   atorvastatin  20 mg Oral Daily   citalopram  20 mg Oral QHS   enoxaparin (LOVENOX) injection  40 mg Subcutaneous Q24H   feeding supplement (GLUCERNA SHAKE)  237 mL Oral Daily   finasteride  5 mg Oral Daily   insulin aspart  0-15 Units Subcutaneous TID WC   insulin aspart  0-5 Units Subcutaneous QHS   insulin detemir  25 Units Subcutaneous QHS   lamoTRIgine  100 mg Oral Daily    risperiDONE  0.5 mg Oral QHS    acetaminophen **OR** acetaminophen, ipratropium-albuterol, ondansetron **OR** ondansetron (ZOFRAN) IV, polyethylene glycol   Objective:   Vitals:   09/18/21 1227 09/18/21 2108 09/19/21 0525 09/19/21 1241  BP: 110/70 (!) 120/59 111/60 (!) 105/57  Pulse: 89 81 75 87  Resp: 16 20 16 18   Temp: 98.8 F (37.1 C) 98.7 F (37.1 C) 98.2 F (36.8 C) 98.1 F (36.7 C)  TempSrc:    Oral  SpO2: 95% 100% 100% 93%  Weight:      Height:        Intake/Output Summary (Last 24 hours) at 09/19/2021 1333 Last data filed at 09/19/2021 1100 Gross per 24 hour  Intake 725.71 ml  Output 2750 ml  Net -2024.29 ml   Filed Weights   09/17/21 2219  Weight: 95 kg     Examination:      General:  AAO x 3,  cooperative, no distress;   HEENT:  Normocephalic, PERRL, otherwise with in Normal limits   Neuro:  CNII-XII intact. , normal motor and sensation, reflexes intact   Lungs:   Clear to auscultation BL, Respirations unlabored,  No wheezes / crackles  Cardio:    S1/S2, RRR, No murmure, No Rubs or Gallops   Abdomen:  Soft, non-tender, bowel sounds active all four quadrants, no guarding or peritoneal signs.  Muscular  skeletal:  Limited exam -global generalized weaknesses - in bed, able to move all 4 extremities,   2+ pulses,  symmetric, No pitting edema  Skin:  Dry, warm to touch, negative for any Rashes,  Wounds: Please see nursing documentation          LABs:     Latest Ref Rng & Units 09/19/2021    4:39 AM 09/18/2021    6:19 AM 09/17/2021    1:08 PM  CBC  WBC 4.0 - 10.5 K/uL 6.9  5.9  8.3   Hemoglobin 13.0 - 17.0 g/dL 16.1  09.6  04.5   Hematocrit 39.0 - 52.0 % 32.1  31.2  36.9   Platelets 150 - 400 K/uL 200  191  223       Latest Ref Rng & Units 09/19/2021    4:39 AM 09/18/2021    6:19 AM 09/17/2021    1:08 PM  CMP  Glucose 70 - 99 mg/dL 409  811  914   BUN 8 - 23 mg/dL 26  29  34   Creatinine 0.61 - 1.24 mg/dL 7.82  9.56  2.13   Sodium 135 -  145 mmol/L 138  137  133   Potassium 3.5 - 5.1 mmol/L 4.1  4.2  4.2   Chloride 98 - 111 mmol/L 103  104  97   CO2 22 - 32 mmol/L 27  26  26    Calcium 8.9 - 10.3 mg/dL 8.8  8.5  9.3   Total Protein 6.5 - 8.1 g/dL   8.0   Total Bilirubin 0.3 - 1.2 mg/dL   0.4   Alkaline Phos 38 - 126 U/L   60   AST 15 - 41 U/L   13   ALT 0 - 44 U/L   19        Micro Results Recent Results (from the past 240 hour(s))  Urine Culture     Status: Abnormal (Preliminary result)   Collection Time: 09/17/21  2:00 PM   Specimen: Urine, Catheterized  Result Value Ref Range Status   Specimen Description   Final    URINE, CATHETERIZED Performed at [facility], [address], [place], [place] [address]    Special Requests   Final    NONE Performed at [facility], [address], [place], [place] [address]    Culture >=100,000 COLONIES/mL PROTEUS MIRABILIS (A)  Final   Report Status PENDING  Incomplete  Blood culture (routine x 2)     Status: Abnormal (Preliminary result)   Collection Time: 09/17/21  7:16 PM   Specimen: BLOOD LEFT HAND  Result Value Ref Range Status   Specimen Description   Final    BLOOD LEFT HAND BOTTLES DRAWN AEROBIC AND ANAEROBIC Performed at [facility], [address] [place], [place] [address]    Special Requests   Final    Blood Culture adequate volume Performed at [facility], [address] [place], [place] [address]    Culture  Setup Time   Final    GRAM POSITIVE COCCI Gram Stain Report Called to,Read Back By and Verified With: BROWN,B ON 09/18/21 AT 2120 BY LOY,C  ANAEROBIC  BOTTLE PERFORMED AT [facility] CRITICAL RESULT CALLED TO, READ BACK BY AND VERIFIED WITH: B BROWN,RN@0047  09/19/21 MK    Culture (A)  Final    STAPHYLOCOCCUS EPIDERMIDIS THE SIGNIFICANCE OF ISOLATING THIS ORGANISM FROM A SINGLE SET OF BLOOD CULTURES WHEN MULTIPLE SETS ARE DRAWN IS UNCERTAIN. PLEASE NOTIFY THE MICROBIOLOGY DEPARTMENT WITHIN ONE WEEK IF SPECIATION AND SENSITIVITIES ARE  REQUIRED. Performed at [facility], [address], [place], [place] [address]    Report Status PENDING  Incomplete  Blood Culture ID Panel (Reflexed)     Status: Abnormal   Collection Time: 09/17/21  7:16 PM  Result Value Ref Range Status   Enterococcus faecalis NOT DETECTED NOT DETECTED Final   Enterococcus Faecium NOT DETECTED NOT DETECTED Final   Listeria monocytogenes NOT DETECTED NOT DETECTED Final   Staphylococcus species DETECTED (A) NOT DETECTED Final    Comment: CRITICAL RESULT CALLED TO, READ BACK BY AND VERIFIED WITH: B BROWN,RN@0045  09/19/21 MK    Staphylococcus aureus (BCID) NOT DETECTED NOT DETECTED Final   Staphylococcus epidermidis DETECTED (A) NOT DETECTED Final    Comment: Methicillin (oxacillin) resistant coagulase negative staphylococcus. Possible blood culture contaminant (unless isolated from more than one blood culture draw or clinical case suggests pathogenicity). No antibiotic treatment is indicated for blood  culture contaminants. CRITICAL RESULT CALLED TO, READ BACK BY AND VERIFIED WITH: B BROWN,RN@0045  09/19/21 MK    Staphylococcus lugdunensis NOT DETECTED NOT DETECTED Final   Streptococcus species NOT DETECTED NOT DETECTED Final   Streptococcus agalactiae NOT DETECTED NOT DETECTED Final   Streptococcus pneumoniae NOT DETECTED NOT DETECTED Final   Streptococcus pyogenes NOT DETECTED NOT DETECTED Final   A.calcoaceticus-baumannii NOT DETECTED NOT DETECTED Final   Bacteroides fragilis NOT DETECTED NOT DETECTED Final   Enterobacterales NOT DETECTED NOT DETECTED Final   Enterobacter cloacae complex NOT DETECTED NOT DETECTED Final   Escherichia coli NOT DETECTED NOT DETECTED Final   Klebsiella aerogenes NOT DETECTED NOT DETECTED Final   Klebsiella oxytoca NOT DETECTED NOT DETECTED Final   Klebsiella pneumoniae NOT DETECTED NOT DETECTED Final   Proteus species NOT DETECTED NOT DETECTED Final   Salmonella species NOT DETECTED NOT DETECTED Final    Serratia marcescens NOT DETECTED NOT DETECTED Final   Haemophilus influenzae NOT DETECTED NOT DETECTED Final   Neisseria meningitidis NOT DETECTED NOT DETECTED Final   Pseudomonas aeruginosa NOT DETECTED NOT DETECTED Final   Stenotrophomonas maltophilia NOT DETECTED NOT DETECTED Final   Candida albicans NOT DETECTED NOT DETECTED Final   Candida auris NOT DETECTED NOT DETECTED Final   Candida glabrata NOT DETECTED NOT DETECTED Final   Candida krusei NOT DETECTED NOT DETECTED Final   Candida parapsilosis NOT DETECTED NOT DETECTED Final   Candida tropicalis NOT DETECTED NOT DETECTED Final   Cryptococcus neoformans/gattii NOT DETECTED NOT DETECTED Final   Methicillin resistance mecA/C DETECTED (A) NOT DETECTED Final    Comment: CRITICAL RESULT CALLED TO, READ BACK BY AND VERIFIED WITH: B BROWN,RN@0045  09/19/21 MK Performed at [facility], [address], [place], [place] [address]   Blood culture (routine x 2)     Status: None (Preliminary result)   Collection Time: 09/17/21  7:28 PM   Specimen: Right Antecubital; Blood  Result Value Ref Range Status   Specimen Description RIGHT ANTECUBITAL  Final   Special Requests   Final    BOTTLES DRAWN AEROBIC AND ANAEROBIC Blood Culture adequate volume   Culture   Final    NO GROWTH 2 DAYS Performed at  [facility], [address], [place], [place] [address]    Report Status PENDING  Incomplete  Culture, blood (Routine X 2) w Reflex to ID Panel     Status: None (Preliminary result)   Collection Time: 09/19/21 10:14 AM   Specimen: BLOOD  Result Value Ref Range Status   Specimen Description BLOOD BLOOD RIGHT HAND  Final   Special Requests   Final    BOTTLES DRAWN AEROBIC AND ANAEROBIC Blood Culture results may not be optimal due to an excessive volume of blood received in culture bottles Performed at [facility], [address] [place], [place] [address]    Culture PENDING  Incomplete   Report Status PENDING  Incomplete  Culture, blood  (Routine X 2) w Reflex to ID Panel     Status: None (Preliminary result)   Collection Time: 09/19/21 10:14 AM   Specimen: BLOOD  Result Value Ref Range Status   Specimen Description BLOOD BLOOD LEFT HAND  Final   Special Requests   Final    Blood Culture results may not be optimal due to an excessive volume of blood received in culture bottles BOTTLES DRAWN AEROBIC AND ANAEROBIC Performed at [facility], [address], [place], [place] [address]    Culture PENDING  Incomplete   Report Status PENDING  Incomplete    Radiology Reports No results found.  SIGNED: Kendell Bane, MD, FHM. [facility],  Pager (please use [email or web address] to page/text) Please use Epic Secure Chat for non-urgent communication (7AM-7PM)  If 7PM-7AM, please contact night-coverage [email or web address], 09/19/2021, 1:33 PM

## 2021-09-20 DIAGNOSIS — N390 Urinary tract infection, site not specified: Secondary | ICD-10-CM | POA: Diagnosis not present

## 2021-09-20 DIAGNOSIS — R319 Hematuria, unspecified: Secondary | ICD-10-CM | POA: Diagnosis not present

## 2021-09-20 LAB — BASIC METABOLIC PANEL
Anion gap: 6 (ref 5–15)
BUN: 30 mg/dL — ABNORMAL HIGH (ref 8–23)
CO2: 29 mmol/L (ref 22–32)
Calcium: 9 mg/dL (ref 8.9–10.3)
Chloride: 103 mmol/L (ref 98–111)
Creatinine, Ser: 1.66 mg/dL — ABNORMAL HIGH (ref 0.61–1.24)
GFR, Estimated: 43 mL/min — ABNORMAL LOW (ref >60)
Glucose, Bld: 159 mg/dL — ABNORMAL HIGH (ref 70–99)
Potassium: 3.8 mmol/L (ref 3.5–5.1)
Sodium: 138 mmol/L (ref 135–145)

## 2021-09-20 LAB — CBC
HCT: 31.5 % — ABNORMAL LOW (ref 39.0–52.0)
Hemoglobin: 10.5 g/dL — ABNORMAL LOW (ref 13.0–17.0)
MCH: 30.1 pg (ref 26.0–34.0)
MCHC: 33.3 g/dL (ref 30.0–36.0)
MCV: 90.3 fL (ref 80.0–100.0)
Platelets: 220 10*3/uL (ref 150–400)
RBC: 3.49 MIL/uL — ABNORMAL LOW (ref 4.22–5.81)
RDW: 13.2 % (ref 11.5–15.5)
WBC: 6.8 10*3/uL (ref 4.0–10.5)
nRBC: 0 % (ref 0.0–0.2)

## 2021-09-20 LAB — CULTURE, BLOOD (ROUTINE X 2): Special Requests: ADEQUATE

## 2021-09-20 LAB — GLUCOSE, CAPILLARY
Glucose-Capillary: 162 mg/dL — ABNORMAL HIGH (ref 70–99)
Glucose-Capillary: 299 mg/dL — ABNORMAL HIGH (ref 70–99)

## 2021-09-20 LAB — URINE CULTURE: Culture: >=100000 — AB

## 2021-09-20 MED ORDER — CEFTRIAXONE SODIUM 1 G IJ SOLR
1.0000 g | Freq: Once | INTRAMUSCULAR | Status: AC
  Administered 2021-09-20: 1 g via INTRAMUSCULAR
  Filled 2021-09-20: qty 10

## 2021-09-20 MED ORDER — CEFDINIR 300 MG PO CAPS: 300.0000 mg | ORAL_CAPSULE | Freq: Two times a day (BID) | ORAL | 0 refills | Status: AC

## 2021-09-20 NOTE — TOC Transition Note (Signed)
Transition of Care [facility]) - CM/SW Discharge Note   Patient Details  Name: Douglas Cook MRN: [id number] Date of Birth: [birth year]  Transition of Care [facility]) CM/SW Contact:  Leitha Bleak, RN Phone Number: 09/20/2021, 11:45 AM   Clinical Narrative:   Patient medically ready to go back to [facility]. FL2 completed and fax to them for review. They called back to say they will pick up patient around noon, RN update, they will get patient up and ready to go.   Final next level of care: Assisted Living Barriers to Discharge: Barriers Resolved   Patient Goals and CMS Choice Patient states their goals for this hospitalization and ongoing recovery are:: to return to ALF. CMS Medicare.gov Compare Post Acute Care list provided to:: Patient Represenative (must comment)    Discharge Placement            Patient and family notified of of transfer: 09/20/21  Discharge Plan and Services      HH Arranged: PT, RN [facility] Agency: [facility] Date [facility] Agency Contacted: 09/20/21 Time HH Agency Contacted: 1120 Representative spoke with at [facility] Agency: Lisa  Readmission Risk Interventions    09/20/2021   11:19 AM 06/20/2021   10:42 AM 06/19/2021   10:14 AM  Readmission Risk Prevention Plan  Transportation Screening Complete Complete Complete  Medication Review [employer]) Complete Complete Complete  PCP or Specialist appointment within 3-5 days of discharge Complete Complete   HRI or Home Care Consult  Complete Complete  SW Recovery Care/Counseling Consult Complete Complete Complete  Palliative Care Screening Not Applicable Not Applicable Not Applicable  Skilled Nursing Facility Not Applicable Not Applicable Not Applicable

## 2021-09-20 NOTE — Care Management Important Message (Signed)
Important Message  Patient Details  Name: Douglas Cook MRN: [id number] Date of Birth: 1945/04/24   Medicare Important Message Given:  N/A - LOS <3 / Initial given by admissions     Corey Harold 09/20/2021, 11:03 AM

## 2021-09-20 NOTE — [place]2 (Signed)
[place] MEDICAID FL2 LEVEL OF CARE SCREENING TOOL     IDENTIFICATION  Patient Name: Douglas Cook Birthdate: [birth year] Sex: male Admission Date (Current Location): 09/17/2021  [facility] and [place] Number:  [employer] and Address:  [facility],  [address], [place] [address]      Provider Number: [id number]  Attending Physician Name and Address:  Erick Blinks, DO  Relative Name and Phone Number:  Lorna Few - Sister - [phone]    Current Level of Care: Hospital Recommended Level of Care: Assisted Living Facility Prior Approval Number:    Date Approved/Denied:   PASRR Number:    Discharge Plan: Domiciliary (Rest home)    Current Diagnoses: Patient Active Problem List   Diagnosis Date Noted   Urinary tract infection 09/18/2021   Hyperglycemia 09/17/2021   Urinary retention 08/10/2021   History of UTI 08/10/2021   Acute metabolic encephalopathy 06/18/2021   Sepsis secondary to UTI (HCC) 05/09/2021   Major neurocognitive disorder (HCC) 02/10/2021   HTN (hypertension) 02/08/2021   BPH without obstruction/lower urinary tract symptoms 02/08/2021   Anemia due to stage 3b chronic kidney disease (HCC) 02/08/2021   CKD stage 3 due to type 2 diabetes mellitus (HCC) 02/08/2021   Chronic constipation 02/08/2021   Major depression, recurrent, chronic (HCC) 02/08/2021   UTI (urinary tract infection) 02/08/2021   Closed fracture of neck of left femur (HCC)    Hip fracture (HCC) 02/01/2021   Acute renal failure superimposed on stage 3a chronic kidney disease (HCC) 07/04/2017   COPD (chronic obstructive pulmonary disease) (HCC) 07/04/2017   Controlled type 2 diabetes mellitus without complication, without long-term current use of insulin (HCC) 07/04/2017   GERD (gastroesophageal reflux disease) 07/04/2017   HLD (hyperlipidemia) 07/04/2017   Depression 07/04/2017    Orientation RESPIRATION BLADDER Height & Weight     Self, Situation   Normal External catheter Weight: 95 kg Height:  6' (182.9 cm)  BEHAVIORAL SYMPTOMS/MOOD NEUROLOGICAL BOWEL NUTRITION STATUS      Continent Diet (See DC summary)  AMBULATORY STATUS COMMUNICATION OF NEEDS Skin     Verbally Normal                       Personal Care Assistance Level of Assistance  Bathing, Feeding, Dressing Bathing Assistance: Limited assistance Feeding assistance: Limited assistance Dressing Assistance: Limited assistance     Functional Limitations Info  Sight, Hearing, Speech Sight Info: Impaired Hearing Info: Impaired Speech Info: Adequate    SPECIAL CARE FACTORS FREQUENCY  PT (By licensed PT)     PT Frequency: 3 times a week              Contractures Contractures Info: Not present    Additional Factors Info  Code Status, Allergies, Psychotropic Code Status Info: FULL Allergies Info: NKDA Psychotropic Info: Psychotropic Info: Celexa, Trazodone, Risperdal         Current Medications (09/20/2021):  This is the current hospital active medication list Current Facility-Administered Medications  Medication Dose Route Frequency Provider Last Rate Last Admin   acetaminophen (TYLENOL) tablet 650 mg  650 mg Oral Q6H PRN Emokpae, Ejiroghene E, MD   650 mg at 09/18/21 1356   Or   acetaminophen (TYLENOL) suppository 650 mg  650 mg Rectal Q6H PRN Emokpae, Ejiroghene E, MD       alfuzosin (UROXATRAL) 24 hr tablet 10 mg  10 mg Oral Q breakfast Emokpae, Ejiroghene E, MD   10 mg at 09/20/21  [id number]   aspirin EC tablet 81 mg  81 mg Oral Daily Emokpae, Ejiroghene E, MD   81 mg at 09/20/21 0855   atorvastatin (LIPITOR) tablet 20 mg  20 mg Oral Daily Emokpae, Ejiroghene E, MD   20 mg at 09/20/21 0853   cefTRIAXone (ROCEPHIN) 1 g in sodium chloride 0.9 % 100 mL IVPB  1 g Intravenous Q24H Emokpae, Ejiroghene E, MD 200 mL/hr at 09/19/21 1227 1 g at 09/19/21 1227   citalopram (CELEXA) tablet 20 mg  20 mg Oral QHS Emokpae, Ejiroghene E, MD   20 mg at 09/19/21 2130    enoxaparin (LOVENOX) injection 40 mg  40 mg Subcutaneous Q24H Emokpae, Ejiroghene E, MD   40 mg at 09/20/21 0854   feeding supplement (GLUCERNA SHAKE) (GLUCERNA SHAKE) liquid 237 mL  237 mL Oral Daily Emokpae, Ejiroghene E, MD   237 mL at 09/20/21 0856   finasteride (PROSCAR) tablet 5 mg  5 mg Oral Daily Emokpae, Ejiroghene E, MD   5 mg at 09/20/21 0853   insulin aspart (novoLOG) injection 0-15 Units  0-15 Units Subcutaneous TID WC Emokpae, Ejiroghene E, MD   3 Units at 09/20/21 0856   insulin aspart (novoLOG) injection 0-5 Units  0-5 Units Subcutaneous QHS Emokpae, Ejiroghene E, MD   2 Units at 09/18/21 2208   insulin detemir (LEVEMIR) injection 25 Units  25 Units Subcutaneous QHS Emokpae, Ejiroghene E, MD   25 Units at 09/19/21 2130   ipratropium-albuterol (DUONEB) 0.5-2.5 (3) MG/3ML nebulizer solution 3 mL  3 mL Nebulization Q6H PRN Shahmehdi, Seyed A, MD       lamoTRIgine (LAMICTAL) tablet 100 mg  100 mg Oral Daily Emokpae, Ejiroghene E, MD   100 mg at 09/20/21 0856   ondansetron (ZOFRAN) tablet 4 mg  4 mg Oral Q6H PRN Emokpae, Ejiroghene E, MD       Or   ondansetron (ZOFRAN) injection 4 mg  4 mg Intravenous Q6H PRN Emokpae, Ejiroghene E, MD       polyethylene glycol (MIRALAX / GLYCOLAX) packet 17 g  17 g Oral Daily PRN Emokpae, Ejiroghene E, MD       risperiDONE (RISPERDAL) tablet 0.5 mg  0.5 mg Oral QHS Emokpae, Ejiroghene E, MD   0.5 mg at 09/19/21 2130     Discharge Medications:    Medication List       STOP taking these medications     cephALEXin 500 MG capsule Commonly known as: KEFLEX           TAKE these medications     acetaminophen 325 MG tablet Commonly known as: TYLENOL Take 2 tablets (650 mg total) by mouth every 6 (six) hours as needed for mild pain (or Fever >/= 101).    albuterol 108 (90 Base) MCG/ACT inhaler Commonly known as: VENTOLIN HFA Inhale 1-2 puffs into the lungs every 4 (four) hours as needed for wheezing or shortness of breath.    alfuzosin 10 MG  24 hr tablet Commonly known as: UROXATRAL TAKE (1) TABLET BY MOUTH ONCE DAILY. What changed: See the new instructions.    Alpha-Lipoic Acid 300 MG Caps Take 1 capsule (300 mg total) by mouth daily at 12 noon. At 10 pm    Alpha-Lipoic Acid 100 MG Caps Take 300 mg by mouth daily.    aspirin EC 81 MG tablet Take 1 tablet (81 mg total) by mouth daily. Swallow whole.    atorvastatin 20 MG tablet Commonly known as: LIPITOR Take 1 tablet (20 mg  total) by mouth daily.    cefdinir 300 MG capsule Commonly known as: OMNICEF Take 1 capsule (300 mg total) by mouth every 12 (twelve) hours for 2 days.    cetirizine 10 MG tablet Commonly known as: ZYRTEC Take 1 tablet (10 mg total) by mouth at bedtime.    cholecalciferol 25 MCG (1000 UNIT) tablet Commonly known as: VITAMIN D Take 1 tablet (1,000 Units total) by mouth daily.    citalopram 20 MG tablet Commonly known as: CELEXA Take 1 tablet (20 mg total) by mouth at bedtime.    Combivent Respimat 20-100 MCG/ACT Aers respimat Generic drug: Ipratropium-Albuterol Inhale 2 puffs into the lungs 2 (two) times daily.    feeding supplement (GLUCERNA SHAKE) Liqd Take 237 mLs by mouth daily.    FeroSul 325 (65 FE) MG tablet Generic drug: ferrous sulfate Take 1 tablet (325 mg total) by mouth 2 (two) times daily.    finasteride 5 MG tablet Commonly known as: PROSCAR Take 5 mg by mouth daily.    fluticasone 50 MCG/ACT nasal spray Commonly known as: FLONASE Place 1 spray into both nostrils daily.    lamoTRIgine 100 MG tablet Commonly known as: LAMICTAL Take 1 tablet (100 mg total) by mouth daily.    Levemir FlexTouch 100 UNIT/ML FlexPen Generic drug: insulin detemir Inject 30 Units into the skin at bedtime. What changed: how much to take    metFORMIN 500 MG 24 hr tablet Commonly known as: GLUCOPHAGE-XR Take 2 tablets (1,000 mg total) by mouth daily with breakfast.    NovoLOG FlexPen 100 UNIT/ML FlexPen Generic drug: insulin  aspart Inject 8 Units into the skin 3 (three) times daily.    omeprazole 20 MG capsule Commonly known as: PRILOSEC Take 1 capsule (20 mg total) by mouth daily.    risperiDONE 0.5 MG tablet Commonly known as: RISPERDAL Take 1 tablet (0.5 mg total) by mouth at bedtime.    senna-docusate 8.6-50 MG tablet Commonly known as: Senokot-S Take 1 tablet by mouth at bedtime as needed for mild constipation.    Systane Balance 0.6 % Soln Generic drug: Propylene Glycol Apply 1 drop to eye 2 (two) times daily. Both eyes    Tradjenta 5 MG Tabs tablet Generic drug: linagliptin Take 1 tablet (5 mg total) by mouth daily.    traZODone 100 MG tablet Commonly known as: DESYREL Take 1 tablet (100 mg total) by mouth at bedtime.      Relevant Imaging Results:  Relevant Lab Results:   Additional Information SS# [id number]  Leitha Bleak, RN

## 2021-09-20 NOTE — Discharge Summary (Signed)
Physician Discharge Summary  Douglas Cook [id number] DOB: [birth year] DOA: 09/17/2021  PCP: Glenda Chroman, MD  Admit date: 09/17/2021  Discharge date: 09/20/2021  Admitted From:ALF  Disposition:  ALF  Recommendations for Outpatient Follow-up:  Follow up with PCP in 1-2 weeks Continue on Omnicef as prescribed to complete course of treatment for Proteus UTI Continue other home medications as previously prescribed  Home Health: Yes with PT, RN  Equipment/Devices: None  Discharge Condition:Stable  CODE STATUS: Full  Diet recommendation: Heart Healthy/carb modified  Brief/Interim Summary: Douglas Cook is a 76 y.o. male with medical history significant for COPD, diabetes mellitus, depression, hypertension, CKD. Patient presented to ED via EMS with reports of hyperglycemia and dysuria.  He was noted to have blood glucose levels in the 400 range.  He was noted to be positive for UTI and was started on IV Rocephin empirically and urine cultures resulted in Proteus UTI which was sensitive to Rocephin.  He is symptomatically improved and his blood glucose is now well controlled.  He is in stable condition for discharge back to his assisted living facility with continuation of home health PT/RN services.  No other acute events noted during the course of the stay.  Discharge Diagnoses:  Principal Problem:   UTI (urinary tract infection) Active Problems:   Hyperglycemia   COPD (chronic obstructive pulmonary disease) (HCC)   Depression   HTN (hypertension)   CKD stage 3 due to type 2 diabetes mellitus (HCC)   Major neurocognitive disorder (HCC)   Urinary tract infection  Principal discharge diagnosis: Sepsis, present on admission secondary to Proteus UTI with associated hyperglycemia in the setting of type 2 diabetes-insulin-dependent.  Discharge Instructions  Discharge Instructions     Diet - low sodium heart healthy   Complete by: As directed    Increase activity slowly    Complete by: As directed       Allergies as of 09/20/2021   No Known Allergies      Medication List     STOP taking these medications    cephALEXin 500 MG capsule Commonly known as: KEFLEX       TAKE these medications    acetaminophen 325 MG tablet Commonly known as: TYLENOL Take 2 tablets (650 mg total) by mouth every 6 (six) hours as needed for mild pain (or Fever >/= 101).   albuterol 108 (90 Base) MCG/ACT inhaler Commonly known as: VENTOLIN HFA Inhale 1-2 puffs into the lungs every 4 (four) hours as needed for wheezing or shortness of breath.   alfuzosin 10 MG 24 hr tablet Commonly known as: UROXATRAL TAKE (1) TABLET BY MOUTH ONCE DAILY. What changed: See the new instructions.   Alpha-Lipoic Acid 300 MG Caps Take 1 capsule (300 mg total) by mouth daily at 12 noon. At 10 pm   Alpha-Lipoic Acid 100 MG Caps Take 300 mg by mouth daily.   aspirin EC 81 MG tablet Take 1 tablet (81 mg total) by mouth daily. Swallow whole.   atorvastatin 20 MG tablet Commonly known as: LIPITOR Take 1 tablet (20 mg total) by mouth daily.   cefdinir 300 MG capsule Commonly known as: OMNICEF Take 1 capsule (300 mg total) by mouth every 12 (twelve) hours for 2 days.   cetirizine 10 MG tablet Commonly known as: ZYRTEC Take 1 tablet (10 mg total) by mouth at bedtime.   cholecalciferol 25 MCG (1000 UNIT) tablet Commonly known as: VITAMIN D Take 1 tablet (1,000 Units total) by mouth daily.  citalopram 20 MG tablet Commonly known as: CELEXA Take 1 tablet (20 mg total) by mouth at bedtime.   Combivent Respimat 20-100 MCG/ACT Aers respimat Generic drug: Ipratropium-Albuterol Inhale 2 puffs into the lungs 2 (two) times daily.   feeding supplement (GLUCERNA SHAKE) Liqd Take 237 mLs by mouth daily.   FeroSul 325 (65 FE) MG tablet Generic drug: ferrous sulfate Take 1 tablet (325 mg total) by mouth 2 (two) times daily.   finasteride 5 MG tablet Commonly known as: PROSCAR Take  5 mg by mouth daily.   fluticasone 50 MCG/ACT nasal spray Commonly known as: FLONASE Place 1 spray into both nostrils daily.   lamoTRIgine 100 MG tablet Commonly known as: LAMICTAL Take 1 tablet (100 mg total) by mouth daily.   Levemir FlexTouch 100 UNIT/ML FlexPen Generic drug: insulin detemir Inject 30 Units into the skin at bedtime. What changed: how much to take   metFORMIN 500 MG 24 hr tablet Commonly known as: GLUCOPHAGE-XR Take 2 tablets (1,000 mg total) by mouth daily with breakfast.   NovoLOG FlexPen 100 UNIT/ML FlexPen Generic drug: insulin aspart Inject 8 Units into the skin 3 (three) times daily.   omeprazole 20 MG capsule Commonly known as: PRILOSEC Take 1 capsule (20 mg total) by mouth daily.   risperiDONE 0.5 MG tablet Commonly known as: RISPERDAL Take 1 tablet (0.5 mg total) by mouth at bedtime.   senna-docusate 8.6-50 MG tablet Commonly known as: Senokot-S Take 1 tablet by mouth at bedtime as needed for mild constipation.   Systane Balance 0.6 % Soln Generic drug: Propylene Glycol Apply 1 drop to eye 2 (two) times daily. Both eyes   Tradjenta 5 MG Tabs tablet Generic drug: linagliptin Take 1 tablet (5 mg total) by mouth daily.   traZODone 100 MG tablet Commonly known as: DESYREL Take 1 tablet (100 mg total) by mouth at bedtime.        Follow-up Information     [facility]. Schedule an appointment as soon as possible for a visit .   Specialty: Urology Contact information: [address] [place] [address] [phone]        Ignatius Specking, MD. Schedule an appointment as soon as possible for a visit in 1 week(s).   Specialty: Internal Medicine Contact information: [address] [place] [address] [phone]                No Known Allergies  Consultations: None   Procedures/Studies: DG CHEST PORT 1 VIEW  Result Date: 09/17/2021 CLINICAL DATA:  Dysuria and hyperglycemia.  EXAM: PORTABLE CHEST 1 VIEW COMPARISON:  07/31/2021 FINDINGS: Stable cardiomediastinal contours. No signs of pleural effusion or edema. No airspace opacities identified. Visualized osseous structures are unremarkable. IMPRESSION: No active disease. Electronically Signed   By: Signa Kell M.D.   On: 09/17/2021 20:37     Discharge Exam: Vitals:   09/19/21 1241 09/19/21 2123  BP: (!) 105/57 116/69  Pulse: 87 81  Resp: 18 20  Temp: 98.1 F (36.7 C) 98.9 F (37.2 C)  SpO2: 93% 98%   Vitals:   09/18/21 2108 09/19/21 0525 09/19/21 1241 09/19/21 2123  BP: (!) 120/59 111/60 (!) 105/57 116/69  Pulse: 81 75 87 81  Resp: 20 16 18 20   Temp: 98.7 F (37.1 C) 98.2 F (36.8 C) 98.1 F (36.7 C) 98.9 F (37.2 C)  TempSrc:   Oral Oral  SpO2: 100% 100% 93% 98%  Weight:  Height:        General: Pt is alert, awake, not in acute distress Cardiovascular: RRR, S1/S2 +, no rubs, no gallops Respiratory: CTA bilaterally, no wheezing, no rhonchi Abdominal: Soft, NT, ND, bowel sounds + Extremities: no edema, no cyanosis    The results of significant diagnostics from this hospitalization (including imaging, microbiology, ancillary and laboratory) are listed below for reference.     Microbiology: Recent Results (from the past 240 hour(s))  Urine Culture     Status: Abnormal   Collection Time: 09/17/21  2:00 PM   Specimen: Urine, Catheterized  Result Value Ref Range Status   Specimen Description   Final    URINE, CATHETERIZED Performed at [facility] [address], [place], [place] [address]    Special Requests   Final    NONE Performed at [facility], [address], [place], [place] [address]    Culture >=100,000 COLONIES/mL PROTEUS MIRABILIS (A)  Final   Report Status 09/20/2021 FINAL  Final   Organism ID, Bacteria PROTEUS MIRABILIS (A)  Final      Susceptibility   Proteus mirabilis - MIC*    AMPICILLIN >=32 RESISTANT Resistant     CEFAZOLIN >=64 RESISTANT Resistant      CEFEPIME <=0.12 SENSITIVE Sensitive     CEFTRIAXONE 1 SENSITIVE Sensitive     CIPROFLOXACIN >=4 RESISTANT Resistant     GENTAMICIN <=1 SENSITIVE Sensitive     IMIPENEM 2 SENSITIVE Sensitive     NITROFURANTOIN 128 RESISTANT Resistant     TRIMETH/SULFA <=20 SENSITIVE Sensitive     AMPICILLIN/SULBACTAM >=32 RESISTANT Resistant     PIP/TAZO <=4 SENSITIVE Sensitive     * >=100,000 COLONIES/mL PROTEUS MIRABILIS  Blood culture (routine x 2)     Status: Abnormal   Collection Time: 09/17/21  7:16 PM   Specimen: BLOOD LEFT HAND  Result Value Ref Range Status   Specimen Description   Final    BLOOD LEFT HAND BOTTLES DRAWN AEROBIC AND ANAEROBIC Performed at [facility], [address] [place], [place] [address]    Special Requests   Final    Blood Culture adequate volume Performed at [facility], [address], [place], [place] [address]    Culture  Setup Time   Final    GRAM POSITIVE COCCI Gram Stain Report Called to,Read Back By and Verified With: BROWN,B ON 09/18/21 AT 2120 BY LOY,C  ANAEROBIC BOTTLE PERFORMED AT [facility] CRITICAL RESULT CALLED TO, READ BACK BY AND VERIFIED WITH: B BROWN,RN@0047  09/19/21 [place]    Culture (A)  Final    STAPHYLOCOCCUS EPIDERMIDIS THE SIGNIFICANCE OF ISOLATING THIS ORGANISM FROM A SINGLE SET OF BLOOD CULTURES WHEN MULTIPLE SETS ARE DRAWN IS UNCERTAIN. PLEASE NOTIFY THE MICROBIOLOGY DEPARTMENT WITHIN ONE WEEK IF SPECIATION AND SENSITIVITIES ARE REQUIRED. Performed at [facility], [place] [address], [place], [place] [address]    Report Status 09/20/2021 FINAL  Final  Blood Culture ID Panel (Reflexed)     Status: Abnormal   Collection Time: 09/17/21  7:16 PM  Result Value Ref Range Status   Enterococcus faecalis NOT DETECTED NOT DETECTED Final   Enterococcus Faecium NOT DETECTED NOT DETECTED Final   Listeria monocytogenes NOT DETECTED NOT DETECTED Final   Staphylococcus species DETECTED (A) NOT DETECTED Final    Comment: CRITICAL RESULT CALLED TO, READ BACK  BY AND VERIFIED WITH: B BROWN,RN@0045  09/19/21 [place]    Staphylococcus aureus (BCID) NOT DETECTED NOT DETECTED Final   Staphylococcus epidermidis DETECTED (A) NOT DETECTED Final  Comment: Methicillin (oxacillin) resistant coagulase negative staphylococcus. Possible blood culture contaminant (unless isolated from more than one blood culture draw or clinical case suggests pathogenicity). No antibiotic treatment is indicated for blood  culture contaminants. CRITICAL RESULT CALLED TO, READ BACK BY AND VERIFIED WITH: B BROWN,RN@0045  09/19/21 [place]    Staphylococcus lugdunensis NOT DETECTED NOT DETECTED Final   Streptococcus species NOT DETECTED NOT DETECTED Final   Streptococcus agalactiae NOT DETECTED NOT DETECTED Final   Streptococcus pneumoniae NOT DETECTED NOT DETECTED Final   Streptococcus pyogenes NOT DETECTED NOT DETECTED Final   A.calcoaceticus-baumannii NOT DETECTED NOT DETECTED Final   Bacteroides fragilis NOT DETECTED NOT DETECTED Final   Enterobacterales NOT DETECTED NOT DETECTED Final   Enterobacter cloacae complex NOT DETECTED NOT DETECTED Final   Escherichia coli NOT DETECTED NOT DETECTED Final   Klebsiella aerogenes NOT DETECTED NOT DETECTED Final   Klebsiella oxytoca NOT DETECTED NOT DETECTED Final   Klebsiella pneumoniae NOT DETECTED NOT DETECTED Final   Proteus species NOT DETECTED NOT DETECTED Final   Salmonella species NOT DETECTED NOT DETECTED Final   Serratia marcescens NOT DETECTED NOT DETECTED Final   Haemophilus influenzae NOT DETECTED NOT DETECTED Final   Neisseria meningitidis NOT DETECTED NOT DETECTED Final   Pseudomonas aeruginosa NOT DETECTED NOT DETECTED Final   Stenotrophomonas maltophilia NOT DETECTED NOT DETECTED Final   Candida albicans NOT DETECTED NOT DETECTED Final   Candida auris NOT DETECTED NOT DETECTED Final   Candida glabrata NOT DETECTED NOT DETECTED Final   Candida krusei NOT DETECTED NOT DETECTED Final   Candida parapsilosis NOT DETECTED NOT  DETECTED Final   Candida tropicalis NOT DETECTED NOT DETECTED Final   Cryptococcus neoformans/gattii NOT DETECTED NOT DETECTED Final   Methicillin resistance mecA/C DETECTED (A) NOT DETECTED Final    Comment: CRITICAL RESULT CALLED TO, READ BACK BY AND VERIFIED WITH: B BROWN,RN@0045  09/19/21 [place] Performed at [facility], [address] [place], [place] [address]   Blood culture (routine x 2)     Status: None (Preliminary result)   Collection Time: 09/17/21  7:28 PM   Specimen: Right Antecubital; Blood  Result Value Ref Range Status   Specimen Description RIGHT ANTECUBITAL  Final   Special Requests   Final    BOTTLES DRAWN AEROBIC AND ANAEROBIC Blood Culture adequate volume   Culture   Final    NO GROWTH 3 DAYS Performed at [facility], [address] [place], [place] [address]    Report Status PENDING  Incomplete  Culture, blood (Routine X 2) w Reflex to ID Panel     Status: None (Preliminary result)   Collection Time: 09/19/21 10:14 AM   Specimen: BLOOD  Result Value Ref Range Status   Specimen Description BLOOD BLOOD RIGHT HAND  Final   Special Requests   Final    BOTTLES DRAWN AEROBIC AND ANAEROBIC Blood Culture results may not be optimal due to an excessive volume of blood received in culture bottles   Culture   Final    NO GROWTH < 24 HOURS Performed at [facility], [address] [place],  [address]    Report Status PENDING  Incomplete  Culture, blood (Routine X 2) w Reflex to ID Panel     Status: None (Preliminary result)   Collection Time: 09/19/21 10:14 AM   Specimen: BLOOD  Result Value Ref Range Status   Specimen Description BLOOD BLOOD LEFT HAND  Final   Special Requests   Final    Blood Culture results may  not be optimal due to an excessive volume of blood received in culture bottles BOTTLES DRAWN AEROBIC AND ANAEROBIC   Culture   Final    NO GROWTH < 24 HOURS Performed at [facility], [address], [place], [place] [address]    Report Status  PENDING  Incomplete     Labs: BNP (last 3 results) No results for input(s): "BNP" in the last 8760 hours. Basic Metabolic Panel: Recent Labs  Lab 09/17/21 1308 09/18/21 0619 09/19/21 0439 09/20/21 0513  NA 133* 137 138 138  K 4.2 4.2 4.1 3.8  CL 97* 104 103 103  CO2 26 26 27 29   GLUCOSE 369* 222* 158* 159*  BUN 34* 29* 26* 30*  CREATININE 1.85* 1.63* 1.57* 1.66*  CALCIUM 9.3 8.5* 8.8* 9.0   Liver Function Tests: Recent Labs  Lab 09/17/21 1308  AST 13*  ALT 19  ALKPHOS 60  BILITOT 0.4  PROT 8.0  ALBUMIN 3.7   No results for input(s): "LIPASE", "AMYLASE" in the last 168 hours. No results for input(s): "AMMONIA" in the last 168 hours. CBC: Recent Labs  Lab 09/17/21 1308 09/18/21 0619 09/19/21 0439 09/20/21 0513  WBC 8.3 5.9 6.9 6.8  HGB 11.9* 10.0* 10.2* 10.5*  HCT 36.9* 31.2* 32.1* 31.5*  MCV 91.6 91.8 93.0 90.3  PLT 223 191 200 220   Cardiac Enzymes: No results for input(s): "CKTOTAL", "CKMB", "CKMBINDEX", "TROPONINI" in the last 168 hours. BNP: Invalid input(s): "POCBNP" CBG: Recent Labs  Lab 09/19/21 0723 09/19/21 1112 09/19/21 1613 09/19/21 2125 09/20/21 0730  GLUCAP 165* 243* 176* 207* 162*   D-Dimer No results for input(s): "DDIMER" in the last 72 hours. Hgb A1c No results for input(s): "HGBA1C" in the last 72 hours. Lipid Profile No results for input(s): "CHOL", "HDL", "LDLCALC", "TRIG", "CHOLHDL", "LDLDIRECT" in the last 72 hours. Thyroid function studies No results for input(s): "TSH", "T4TOTAL", "T3FREE", "THYROIDAB" in the last 72 hours.  Invalid input(s): "FREET3" Anemia work up No results for input(s): "VITAMINB12", "FOLATE", "FERRITIN", "TIBC", "IRON", "RETICCTPCT" in the last 72 hours. Urinalysis    Component Value Date/Time   COLORURINE YELLOW 09/17/2021 1308   APPEARANCEUR CLOUDY (A) 09/17/2021 1308   LABSPEC 1.018 09/17/2021 1308   PHURINE 8.0 09/17/2021 1308   GLUCOSEU >=500 (A) 09/17/2021 1308   HGBUR NEGATIVE  09/17/2021 1308   BILIRUBINUR NEGATIVE 09/17/2021 1308   KETONESUR NEGATIVE 09/17/2021 1308   PROTEINUR 100 (A) 09/17/2021 1308   NITRITE NEGATIVE 09/17/2021 1308   LEUKOCYTESUR LARGE (A) 09/17/2021 1308   Sepsis Labs Recent Labs  Lab 09/17/21 1308 09/18/21 0619 09/19/21 0439 09/20/21 0513  WBC 8.3 5.9 6.9 6.8   Microbiology Recent Results (from the past 240 hour(s))  Urine Culture     Status: Abnormal   Collection Time: 09/17/21  2:00 PM   Specimen: Urine, Catheterized  Result Value Ref Range Status   Specimen Description   Final    URINE, CATHETERIZED Performed at [facility], [address] [place], [place] [address]    Special Requests   Final    NONE Performed at [facility], [address] [place], [place] [address]    Culture >=100,000 COLONIES/mL PROTEUS MIRABILIS (A)  Final   Report Status 09/20/2021 FINAL  Final   Organism ID, Bacteria PROTEUS MIRABILIS (A)  Final      Susceptibility   Proteus mirabilis - MIC*    AMPICILLIN >=32 RESISTANT Resistant     CEFAZOLIN >=64 RESISTANT Resistant     CEFEPIME <=0.12 SENSITIVE  Sensitive     CEFTRIAXONE 1 SENSITIVE Sensitive     CIPROFLOXACIN >=4 RESISTANT Resistant     GENTAMICIN <=1 SENSITIVE Sensitive     IMIPENEM 2 SENSITIVE Sensitive     NITROFURANTOIN 128 RESISTANT Resistant     TRIMETH/SULFA <=20 SENSITIVE Sensitive     AMPICILLIN/SULBACTAM >=32 RESISTANT Resistant     PIP/TAZO <=4 SENSITIVE Sensitive     * >=100,000 COLONIES/mL PROTEUS MIRABILIS  Blood culture (routine x 2)     Status: Abnormal   Collection Time: 09/17/21  7:16 PM   Specimen: BLOOD LEFT HAND  Result Value Ref Range Status   Specimen Description   Final    BLOOD LEFT HAND BOTTLES DRAWN AEROBIC AND ANAEROBIC Performed at [facility], [address], [place], [place] [address]    Special Requests   Final    Blood Culture adequate volume Performed at [facility], [address], [place], [place] [address]    Culture  Setup Time   Final     GRAM POSITIVE COCCI Gram Stain Report Called to,Read Back By and Verified With: BROWN,B ON 09/18/21 AT 2120 BY LOY,C  ANAEROBIC BOTTLE PERFORMED AT [facility] CRITICAL RESULT CALLED TO, READ BACK BY AND VERIFIED WITH: B BROWN,RN@0047  09/19/21 [place]    Culture (A)  Final    STAPHYLOCOCCUS EPIDERMIDIS THE SIGNIFICANCE OF ISOLATING THIS ORGANISM FROM A SINGLE SET OF BLOOD CULTURES WHEN MULTIPLE SETS ARE DRAWN IS UNCERTAIN. PLEASE NOTIFY THE MICROBIOLOGY DEPARTMENT WITHIN ONE WEEK IF SPECIATION AND SENSITIVITIES ARE REQUIRED. Performed at [facility], [address], [place], [place] [address]    Report Status 09/20/2021 FINAL  Final  Blood Culture ID Panel (Reflexed)     Status: Abnormal   Collection Time: 09/17/21  7:16 PM  Result Value Ref Range Status   Enterococcus faecalis NOT DETECTED NOT DETECTED Final   Enterococcus Faecium NOT DETECTED NOT DETECTED Final   Listeria monocytogenes NOT DETECTED NOT DETECTED Final   Staphylococcus species DETECTED (A) NOT DETECTED Final    Comment: CRITICAL RESULT CALLED TO, READ BACK BY AND VERIFIED WITH: B BROWN,RN@0045  09/19/21 [place]    Staphylococcus aureus (BCID) NOT DETECTED NOT DETECTED Final   Staphylococcus epidermidis DETECTED (A) NOT DETECTED Final    Comment: Methicillin (oxacillin) resistant coagulase negative staphylococcus. Possible blood culture contaminant (unless isolated from more than one blood culture draw or clinical case suggests pathogenicity). No antibiotic treatment is indicated for blood  culture contaminants. CRITICAL RESULT CALLED TO, READ BACK BY AND VERIFIED WITH: B BROWN,RN@0045  09/19/21 [place]    Staphylococcus lugdunensis NOT DETECTED NOT DETECTED Final   Streptococcus species NOT DETECTED NOT DETECTED Final   Streptococcus agalactiae NOT DETECTED NOT DETECTED Final   Streptococcus pneumoniae NOT DETECTED NOT DETECTED Final   Streptococcus pyogenes NOT DETECTED NOT DETECTED Final   A.calcoaceticus-baumannii NOT DETECTED  NOT DETECTED Final   Bacteroides fragilis NOT DETECTED NOT DETECTED Final   Enterobacterales NOT DETECTED NOT DETECTED Final   Enterobacter cloacae complex NOT DETECTED NOT DETECTED Final   Escherichia coli NOT DETECTED NOT DETECTED Final   Klebsiella aerogenes NOT DETECTED NOT DETECTED Final   Klebsiella oxytoca NOT DETECTED NOT DETECTED Final   Klebsiella pneumoniae NOT DETECTED NOT DETECTED Final   Proteus species NOT DETECTED NOT DETECTED Final   Salmonella species NOT DETECTED NOT DETECTED Final   Serratia marcescens NOT DETECTED NOT DETECTED Final   Haemophilus influenzae NOT DETECTED NOT DETECTED Final   Neisseria meningitidis NOT DETECTED NOT DETECTED Final  Pseudomonas aeruginosa NOT DETECTED NOT DETECTED Final   Stenotrophomonas maltophilia NOT DETECTED NOT DETECTED Final   Candida albicans NOT DETECTED NOT DETECTED Final   Candida auris NOT DETECTED NOT DETECTED Final   Candida glabrata NOT DETECTED NOT DETECTED Final   Candida krusei NOT DETECTED NOT DETECTED Final   Candida parapsilosis NOT DETECTED NOT DETECTED Final   Candida tropicalis NOT DETECTED NOT DETECTED Final   Cryptococcus neoformans/gattii NOT DETECTED NOT DETECTED Final   Methicillin resistance mecA/C DETECTED (A) NOT DETECTED Final    Comment: CRITICAL RESULT CALLED TO, READ BACK BY AND VERIFIED WITH: B BROWN,RN@0045  09/19/21 [place] Performed at [facility], [address], [place], [place] [address]   Blood culture (routine x 2)     Status: None (Preliminary result)   Collection Time: 09/17/21  7:28 PM   Specimen: Right Antecubital; Blood  Result Value Ref Range Status   Specimen Description RIGHT ANTECUBITAL  Final   Special Requests   Final    BOTTLES DRAWN AEROBIC AND ANAEROBIC Blood Culture adequate volume   Culture   Final    NO GROWTH 3 DAYS Performed at [facility], [address] [place], [place] [address]    Report Status PENDING  Incomplete  Culture, blood (Routine X 2) w Reflex to ID  Panel     Status: None (Preliminary result)   Collection Time: 09/19/21 10:14 AM   Specimen: BLOOD  Result Value Ref Range Status   Specimen Description BLOOD BLOOD RIGHT HAND  Final   Special Requests   Final    BOTTLES DRAWN AEROBIC AND ANAEROBIC Blood Culture results may not be optimal due to an excessive volume of blood received in culture bottles   Culture   Final    NO GROWTH < 24 HOURS Performed at [facility], [address] [place], [place] [address]    Report Status PENDING  Incomplete  Culture, blood (Routine X 2) w Reflex to ID Panel     Status: None (Preliminary result)   Collection Time: 09/19/21 10:14 AM   Specimen: BLOOD  Result Value Ref Range Status   Specimen Description BLOOD BLOOD LEFT HAND  Final   Special Requests   Final    Blood Culture results may not be optimal due to an excessive volume of blood received in culture bottles BOTTLES DRAWN AEROBIC AND ANAEROBIC   Culture   Final    NO GROWTH < 24 HOURS Performed at [facility], [address] [place], [place] [address]    Report Status PENDING  Incomplete     Time coordinating discharge: 35 minutes  SIGNED:   Rodena Goldmann, DO [facility] 09/20/2021, 10:30 AM  If 7PM-7AM, please contact night-coverage [email or web address]

## 2021-09-22 LAB — CULTURE, BLOOD (ROUTINE X 2)
Culture: NO GROWTH
Special Requests: ADEQUATE

## 2021-09-24 LAB — CULTURE, BLOOD (ROUTINE X 2)
Culture: NO GROWTH
Culture: NO GROWTH

## 2021-10-19 ENCOUNTER — Emergency Department (HOSPITAL_COMMUNITY)
Admission: EM | Admit: 2021-10-19 | Discharge: 2021-10-20 | Disposition: A | Discharge status: Skilled Nursing Facility | Payer: Medicare Other | Attending: Emergency Medicine | Admitting: Emergency Medicine

## 2021-10-19 ENCOUNTER — Encounter (HOSPITAL_COMMUNITY): Payer: Self-pay

## 2021-10-19 ENCOUNTER — Other Ambulatory Visit: Payer: Self-pay

## 2021-10-19 DIAGNOSIS — Z794 Long term (current) use of insulin: Secondary | ICD-10-CM | POA: Insufficient documentation

## 2021-10-19 DIAGNOSIS — Z79899 Other long term (current) drug therapy: Secondary | ICD-10-CM | POA: Diagnosis not present

## 2021-10-19 DIAGNOSIS — N3000 Acute cystitis without hematuria: Secondary | ICD-10-CM

## 2021-10-19 DIAGNOSIS — Z7982 Long term (current) use of aspirin: Secondary | ICD-10-CM | POA: Insufficient documentation

## 2021-10-19 DIAGNOSIS — Z7984 Long term (current) use of oral hypoglycemic drugs: Secondary | ICD-10-CM | POA: Insufficient documentation

## 2021-10-19 DIAGNOSIS — R300 Dysuria: Secondary | ICD-10-CM | POA: Diagnosis present

## 2021-10-19 LAB — CBC WITH DIFFERENTIAL/PLATELET
Abs Immature Granulocytes: 0.03 10*3/uL (ref 0.00–0.07)
Basophils Absolute: 0.1 10*3/uL (ref 0.0–0.1)
Basophils Relative: 1 %
Eosinophils Absolute: 1 10*3/uL — ABNORMAL HIGH (ref 0.0–0.5)
Eosinophils Relative: 11 %
HCT: 33.1 % — ABNORMAL LOW (ref 39.0–52.0)
Hemoglobin: 10.8 g/dL — ABNORMAL LOW (ref 13.0–17.0)
Immature Granulocytes: 0 %
Lymphocytes Relative: 16 %
Lymphs Abs: 1.5 10*3/uL (ref 0.7–4.0)
MCH: 29.7 pg (ref 26.0–34.0)
MCHC: 32.6 g/dL (ref 30.0–36.0)
MCV: 90.9 fL (ref 80.0–100.0)
Monocytes Absolute: 0.8 10*3/uL (ref 0.1–1.0)
Monocytes Relative: 9 %
Neutro Abs: 5.8 10*3/uL (ref 1.7–7.7)
Neutrophils Relative %: 63 %
Platelets: 223 10*3/uL (ref 150–400)
RBC: 3.64 MIL/uL — ABNORMAL LOW (ref 4.22–5.81)
RDW: 13.3 % (ref 11.5–15.5)
WBC: 9.2 10*3/uL (ref 4.0–10.5)
nRBC: 0 % (ref 0.0–0.2)

## 2021-10-19 LAB — URINALYSIS, ROUTINE W REFLEX MICROSCOPIC
Bilirubin Urine: NEGATIVE
Glucose, UA: 150 mg/dL — AB
Hgb urine dipstick: NEGATIVE
Ketones, ur: NEGATIVE mg/dL
Nitrite: NEGATIVE
Protein, ur: 30 mg/dL — AB
Specific Gravity, Urine: 1.016 (ref 1.005–1.030)
WBC, UA: >50 WBC/hpf — ABNORMAL HIGH (ref 0–5)
pH: 5 (ref 5.0–8.0)

## 2021-10-19 LAB — BASIC METABOLIC PANEL
Anion gap: 9 (ref 5–15)
BUN: 30 mg/dL — ABNORMAL HIGH (ref 8–23)
CO2: 26 mmol/L (ref 22–32)
Calcium: 9 mg/dL (ref 8.9–10.3)
Chloride: 102 mmol/L (ref 98–111)
Creatinine, Ser: 1.69 mg/dL — ABNORMAL HIGH (ref 0.61–1.24)
GFR, Estimated: 42 mL/min — ABNORMAL LOW (ref >60)
Glucose, Bld: 298 mg/dL — ABNORMAL HIGH (ref 70–99)
Potassium: 4 mmol/L (ref 3.5–5.1)
Sodium: 137 mmol/L (ref 135–145)

## 2021-10-19 LAB — CBG MONITORING, ED: Glucose-Capillary: 282 mg/dL — ABNORMAL HIGH (ref 70–99)

## 2021-10-19 MED ORDER — SODIUM CHLORIDE 0.9 % IV BOLUS
500.0000 mL | Freq: Once | INTRAVENOUS | Status: AC
  Administered 2021-10-19: 500 mL via INTRAVENOUS

## 2021-10-19 MED ORDER — SULFAMETHOXAZOLE-TRIMETHOPRIM 800-160 MG PO TABS: 1.0000 | ORAL_TABLET | Freq: Two times a day (BID) | ORAL | 0 refills | Status: AC

## 2021-10-19 MED ORDER — SODIUM CHLORIDE 0.9 % IV SOLN
1.0000 g | Freq: Once | INTRAVENOUS | Status: AC
  Administered 2021-10-19: 1 g via INTRAVENOUS
  Filled 2021-10-19: qty 10

## 2021-10-19 NOTE — ED Notes (Signed)
[place] scheduler called to set up transportation at this time. 

## 2021-10-19 NOTE — ED Provider Notes (Signed)
[facility] EMERGENCY DEPARTMENT Provider Note   CSN: [id number] Arrival date & time: 10/19/21  9470     History  Chief Complaint  Patient presents with   Dysuria    Douglas Cook is a 76 y.o. male presenting to the emergency department with complaint of UTI.  Patient reports that she began having burning with urination yesterday.  He says it feels like her prior UTI.  He denies fevers, chills, nausea, vomiting, weakness  Medical record shows the patient was in the hospital with both a UTI and bacteremia approximately 1 month ago, in June 2023.  His urine culture results are listed below.  His blood cultures are positive for Staph aureus and Staph epidermidis.  He was treated with IV antibiotics in the ED, IV Rocephin, and discharged on cefdinir for 2 additional days, which was completed at the end of June.    Specimen Description URINE, CATHETERIZED  Performed at [facility], [address] [place], [place] [address]   Special Requests NONE  Performed at [facility], [address], [place], [place] [address]   Culture >=100,000 COLONIES/mL PROTEUS MIRABILIS Abnormal    Report Status 09/20/2021 FINAL   Organism ID, Bacteria PROTEUS MIRABILIS Abnormal    Resulting Agency [facility]     Susceptibility   Proteus mirabilis    MIC    AMPICILLIN >=32 RESIST... Resistant    AMPICILLIN/SULBACTAM >=32 RESIST... Resistant    CEFAZOLIN >=64 RESIST... Resistant    CEFEPIME <=0.12 SENS... Sensitive    CEFTRIAXONE 1 SENSITIVE  Sensitive    CIPROFLOXACIN >=4 RESISTANT  Resistant    GENTAMICIN <=1 SENSITIVE  Sensitive    IMIPENEM 2 SENSITIVE  Sensitive    NITROFURANTOIN 128 RESISTANT  Resistant    PIP/TAZO <=4 SENSITIVE  Sensitive    TRIMETH/SULFA <=20 SENSIT... Sensitive           HPI     Home Medications Prior to Admission medications   Medication Sig Start Date End Date Taking? Authorizing Provider  sulfamethoxazole-trimethoprim (BACTRIM DS) 800-160 MG tablet Take 1  tablet by mouth 2 (two) times daily for 9 days. 10/19/21 10/28/21 Yes Zakari Couchman, Kermit Balo, MD  acetaminophen (TYLENOL) 325 MG tablet Take 2 tablets (650 mg total) by mouth every 6 (six) hours as needed for mild pain (or Fever >/= 101). 03/22/21   Sharee Holster, NP  albuterol (VENTOLIN HFA) 108 (90 Base) MCG/ACT inhaler Inhale 1-2 puffs into the lungs every 4 (four) hours as needed for wheezing or shortness of breath.    [provider]  alfuzosin (UROXATRAL) 10 MG 24 hr tablet TAKE (1) TABLET BY MOUTH ONCE DAILY. Patient taking differently: Take 10 mg by mouth daily with breakfast. 08/29/21   Stoneking, Danford Bad., MD  Alpha-Lipoic Acid 100 MG CAPS Take 300 mg by mouth daily. 08/30/21   [provider]  Alpha-Lipoic Acid 300 MG CAPS Take 1 capsule (300 mg total) by mouth daily at 12 noon. At 10 pm Patient not taking: Reported on 09/17/2021 03/22/21   Sharee Holster, NP  aspirin EC 81 MG tablet Take 1 tablet (81 mg total) by mouth daily. Swallow whole. 03/22/21   Sharee Holster, NP  atorvastatin (LIPITOR) 20 MG tablet Take 1 tablet (20 mg total) by mouth daily. 03/22/21   Sharee Holster, NP  cetirizine (ZYRTEC) 10 MG tablet Take 1 tablet (10 mg total) by mouth at bedtime. 03/22/21   Sharee Holster, NP  cholecalciferol (VITAMIN D) 25 MCG (  1000 UNIT) tablet Take 1 tablet (1,000 Units total) by mouth daily. 03/22/21   Sharee Holster, NP  citalopram (CELEXA) 20 MG tablet Take 1 tablet (20 mg total) by mouth at bedtime. 03/22/21   Sharee Holster, NP  feeding supplement, GLUCERNA SHAKE, (GLUCERNA SHAKE) LIQD Take 237 mLs by mouth daily. 03/22/21   Sharee Holster, NP  FEROSUL 325 (65 Fe) MG tablet Take 1 tablet (325 mg total) by mouth 2 (two) times daily. 03/22/21   Sharee Holster, NP  finasteride (PROSCAR) 5 MG tablet Take 5 mg by mouth daily. 09/09/21   [provider]  fluticasone (FLONASE) 50 MCG/ACT nasal spray Place 1 spray into both nostrils daily. 03/22/21   Sharee Holster, NP  Ipratropium-Albuterol (COMBIVENT RESPIMAT) 20-100 MCG/ACT AERS respimat Inhale 2 puffs into the lungs 2 (two) times daily. 03/22/21   Sharee Holster, NP  lamoTRIgine (LAMICTAL) 100 MG tablet Take 1 tablet (100 mg total) by mouth daily. 03/22/21   Sharee Holster, NP  LEVEMIR FLEXTOUCH 100 UNIT/ML FlexTouch Pen Inject 30 Units into the skin at bedtime. Patient taking differently: Inject 50 Units into the skin at bedtime. 03/22/21   Sharee Holster, NP  metFORMIN (GLUCOPHAGE-XR) 500 MG 24 hr tablet Take 2 tablets (1,000 mg total) by mouth daily with breakfast. 03/22/21   Sharee Holster, NP  NOVOLOG FLEXPEN 100 UNIT/ML FlexPen Inject 8 Units into the skin 3 (three) times daily. 03/22/21   [provider]  omeprazole (PRILOSEC) 20 MG capsule Take 1 capsule (20 mg total) by mouth daily. 03/22/21   Sharee Holster, NP  Propylene Glycol (SYSTANE BALANCE) 0.6 % SOLN Apply 1 drop to eye 2 (two) times daily. Both eyes 03/22/21   Sharee Holster, NP  risperiDONE (RISPERDAL) 0.5 MG tablet Take 1 tablet (0.5 mg total) by mouth at bedtime. 03/22/21   Sharee Holster, NP  senna-docusate (SENOKOT-S) 8.6-50 MG tablet Take 1 tablet by mouth at bedtime as needed for mild constipation. 03/22/21   Sharee Holster, NP  TRADJENTA 5 MG TABS tablet Take 1 tablet (5 mg total) by mouth daily. 03/22/21   Sharee Holster, NP  traZODone (DESYREL) 100 MG tablet Take 1 tablet (100 mg total) by mouth at bedtime. 03/22/21   Sharee Holster, NP      Allergies    Patient has no known allergies.    Review of Systems   Review of Systems  Physical Exam Updated Vital Signs BP 103/71 (BP Location: Right Arm)   Pulse 78   Temp 98 F (36.7 C) (Oral)   Resp 18   Ht 6' (1.829 m)   Wt 90.7 kg   SpO2 98%   BMI 27.12 kg/m  Physical Exam Constitutional:      General: He is not in acute distress. HENT:     Head: Normocephalic and atraumatic.  Eyes:     Conjunctiva/sclera: Conjunctivae  normal.     Pupils: Pupils are equal, round, and reactive to light.  Cardiovascular:     Rate and Rhythm: Normal rate and regular rhythm.  Pulmonary:     Effort: Pulmonary effort is normal. No respiratory distress.  Abdominal:     General: There is no distension.     Tenderness: There is no abdominal tenderness.  Skin:    General: Skin is warm and dry.  Neurological:     General: No focal deficit present.     Mental Status: He is alert. Mental  status is at baseline.  Psychiatric:        Mood and Affect: Mood normal.        Behavior: Behavior normal.     ED Results / Procedures / Treatments   Labs (all labs ordered are listed, but only abnormal results are displayed) Labs Reviewed  URINALYSIS, ROUTINE W REFLEX MICROSCOPIC - Abnormal; Notable for the following components:      Result Value   APPearance TURBID (*)    Glucose, UA 150 (*)    Protein, ur 30 (*)    Leukocytes,Ua LARGE (*)    WBC, UA >50 (*)    Bacteria, UA RARE (*)    Non Squamous Epithelial 0-5 (*)    All other components within normal limits  BASIC METABOLIC PANEL - Abnormal; Notable for the following components:   Glucose, Bld 298 (*)    BUN 30 (*)    Creatinine, Ser 1.69 (*)    GFR, Estimated 42 (*)    All other components within normal limits  CBC WITH DIFFERENTIAL/PLATELET - Abnormal; Notable for the following components:   RBC 3.64 (*)    Hemoglobin 10.8 (*)    HCT 33.1 (*)    Eosinophils Absolute 1.0 (*)    All other components within normal limits  CBG MONITORING, ED - Abnormal; Notable for the following components:   Glucose-Capillary 282 (*)    All other components within normal limits  URINE CULTURE    EKG None  Radiology No results found.  Procedures Procedures    Medications Ordered in ED Medications  sodium chloride 0.9 % bolus 500 mL (0 mLs Intravenous Stopped 10/19/21 1136)  cefTRIAXone (ROCEPHIN) 1 g in sodium chloride 0.9 % 100 mL IVPB (0 g Intravenous Stopped 10/19/21 1315)     ED Course/ Medical Decision Making/ A&P Clinical Course as of 10/19/21 1509  Thu Oct 19, 2021  1228 UA could be suggestive of recurrent UTI, this history we will initiate IV antibiotics with IV Rocephin based on his last culture sensitivity.  The only reasonable oral option would be Bactrim.  I think between the two antibiotics we likely would achieve adequate cross coverage for possible recurrent UTI. [MT]    Clinical Course User Index [MT] Kailie Polus, Kermit Balo, MD                           Medical Decision Making Amount and/or Complexity of Data Reviewed Labs: ordered.  Risk Prescription drug management.   Patient is here with dysuria, differential diagnosis include UTI versus other.  He is afebrile, well-appearing on exam.  He does not show signs of sepsis.  We will recheck his kidney function his white blood cell count given his recent history and hospitalization for bacteremia.  It is not clear whether those were contaminants at that time per my review of the records.  I personally reviewed his external records including hospital discharge summary and urine culture results as listed above.  Supplemental history provided by EMS on patient's arrival.  I personally reviewed and interpreted the patient's labs, notable for no significant abnormalities or changes from recent labs.  UA with possibility of infection.  Urine culture will be sent.  IV Rocephin ordered for antibiotic coverage, will switch him to Bactrim per his last urine culture results  I do not see clear evidence of sepsis at this time.  Do not believe he needs medical admission.  Close return precautions provided in discharge summary  Final Clinical Impression(s) / ED Diagnoses Final diagnoses:  Acute cystitis without hematuria    Rx / DC Orders ED Discharge Orders          Ordered    sulfamethoxazole-trimethoprim (BACTRIM DS) 800-160 MG tablet  2 times daily        10/19/21 1253               Terald Sleeper, MD 10/19/21 [phone]

## 2021-10-19 NOTE — ED Notes (Signed)
Attempted to call [facility] to setup transportation at this time. No answer [facility] called to set up transport.

## 2021-10-19 NOTE — ED Triage Notes (Signed)
"  Dysuria since last night, no other complaints" per pt

## 2021-10-19 NOTE — Discharge Instructions (Addendum)
Norrin's urine analysis shows the POSSIBILITY of another urine infection.  His urine culture will take 2 to 3 days to develop results.    However, given his history of sepsis and bad urine infections, and his description of discomfort with urination, we felt it was reasonable to treat him with antibiotics.  He was given IV Rocephin in the ER, and he will be discharged on an additional 9 days of Bactrim, starting tomorrow (10/20/21).  Both of these antibiotics were chosen based on the culture sensitivities from his last urine infection.  If he develops fevers, chills, lightheadedness, low blood pressure or racing heart, he should be return to the hospital.  These may be signs or symptoms of a worsening infection, including failure to treat with antibiotics, or even sepsis.  At this time his blood work and blood counts did not suggest signs of sepsis.

## 2021-10-20 MED ORDER — ACETAMINOPHEN 325 MG PO TABS
650.0000 mg | ORAL_TABLET | Freq: Once | ORAL | Status: AC
  Administered 2021-10-20: 650 mg via ORAL
  Filled 2021-10-20: qty 2

## 2021-10-21 LAB — URINE CULTURE: Culture: 30000 — AB

## 2021-10-22 ENCOUNTER — Telehealth (HOSPITAL_BASED_OUTPATIENT_CLINIC_OR_DEPARTMENT_OTHER): Payer: Self-pay | Admitting: *Deleted

## 2021-10-22 NOTE — Telephone Encounter (Signed)
Post ED Visit - Positive Culture Follow-up  Culture report reviewed by antimicrobial stewardship pharmacist: [facility] []  , Pharm.D. []  Enzo Bi, Pharm.D., BCPS AQ-ID []  , Pharm.D., BCPS []  Celedonio Miyamoto, .D., BCPS []  [place], .D., BCPS, AAHIVP []  Georgina Pillion, Pharm.D., BCPS, AAHIVP []  [address], PharmD, BCPS []  , PharmD, BCPS []  [place], PharmD, BCPS []  [place], PharmD []  , PharmD, BCPS [x]  Estella Husk, PharmD  Pharmacy Team []  Lysle Pearl, PharmD []  , PharmD []  Phillips Climes, PharmD []  , Rph []  Agapito Games) , PharmD []  Verlan Friends, PharmD []  , PharmD []  Mervyn Gay, PharmD []  , PharmD []  Riccardo Dubin, PharmD []  [facility], PharmD []  , PharmD []  Len Childs, PharmD   Positive urine culture Treated with Sulfamethoxazole, organism sensitive to the same and no further patient follow-up is required at this time.  10/22/2021, 12:24 PM

## 2021-10-24 ENCOUNTER — Other Ambulatory Visit: Payer: Self-pay | Admitting: Urology

## 2021-10-24 ENCOUNTER — Encounter: Payer: Self-pay | Admitting: Urology

## 2021-10-24 ENCOUNTER — Ambulatory Visit (INDEPENDENT_AMBULATORY_CARE_PROVIDER_SITE_OTHER): Payer: Medicare Other | Admitting: Urology

## 2021-10-24 VITALS — BP 122/74 | HR 81

## 2021-10-24 DIAGNOSIS — Z87440 Personal history of urinary (tract) infections: Secondary | ICD-10-CM | POA: Diagnosis not present

## 2021-10-24 DIAGNOSIS — R3129 Other microscopic hematuria: Secondary | ICD-10-CM | POA: Diagnosis not present

## 2021-10-24 DIAGNOSIS — N138 Other obstructive and reflux uropathy: Secondary | ICD-10-CM | POA: Diagnosis not present

## 2021-10-24 DIAGNOSIS — N401 Enlarged prostate with lower urinary tract symptoms: Secondary | ICD-10-CM | POA: Diagnosis not present

## 2021-10-24 NOTE — Progress Notes (Signed)
post void residual=27 

## 2021-10-24 NOTE — Progress Notes (Signed)
Assessment: 1. BPH with obstruction/lower urinary tract symptoms   2. History of UTI   3. Microscopic hematuria     Plan: I reviewed his records from his recent hospital visits including urine culture results. He is currently completing a course of Bactrim DS. Continue alfuzosin 10 mg p.o. daily. Recommend beginning fosfomycin 3 g PO every 10 days for UTI prevention Schedule for CT renal stone study Return to office in 3-4 weeks for cystoscopy   Chief Complaint:  Chief Complaint  Patient presents with   Benign Prostatic Hypertrophy    History of Present Illness:  Douglas Cook is a 76 y.o. year old male who is seen for further evaluation of urinary retention and history of UTIs.  He was diagnosed with a UTI in February 2023 and again in March 2023.  Urine cultures from 2/23 and 3/23 grew E. coli.  He was treated with appropriate antibiotics.  He had a Foley catheter placed in February 2023 with return of 600 mL.  The Foley catheter was removed prior to discharge from the hospital.  He presented to the emergency room on 07/31/2021 with urinary frequency and dysuria.  Urinalysis showed 0-5 RBCs, 6-10 WBCs, and no bacteria. Bladder scan revealed a volume of 347 mL.  A Foley catheter was placed with return of 600 mL of urine.  He had been on tamsulosin since 04/25/2021. His Foley was removed on 08/14/2021.  Unfortunately, he was unable to void and a catheter was reinserted later that day. He was seen in the emergency room on 08/15/2021 with discomfort associated with the Foley catheter.  He was started on cefdinir. Urine culture showed no growth. At his visit on 08/17/21, his foley was draining well.  He was changed to alfuzosin. His foley was removed on 08/23/21.    At his visit in June 2023, he was voiding spontaneously.  He felt like he was emptying his bladder well.  He continued on alfuzosin.  No dysuria or gross hematuria. IPSS = 1.  PVR = 77 ml.  He returns today for follow-up.   He has had 2 UTIs since his last visit.  He was seen at [facility] for both of these episodes.  His symptoms included fever, generalized malaise, and dysuria. Urine culture results: 09/17/2021 >100 K. Proteus 10/19/2021 30K Proteus  He is currently on Bactrim DS.  He is feeling better since starting antibiotic therapy.  No recent fevers or chills.  No flank pain.  No dysuria or gross hematuria today.  He is currently a patient at [facility].  Portions of the above documentation were copied from a prior visit for review purposes only.   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)    Arthritis    CKD (chronic kidney disease), stage III (HCC)    COPD (chronic obstructive pulmonary disease) (HCC)    Depression    Diabetes mellitus without complication (HCC)    Fatigue    GERD (gastroesophageal reflux disease)    Hyperlipidemia    Osteoarthritis    Reflux     Past Surgical History:  Past Surgical History:  Procedure Laterality Date   HIP ARTHROPLASTY Left 02/03/2021   Procedure: ARTHROPLASTY BIPOLAR HIP (HEMIARTHROPLASTY);  Surgeon: Vickki Hearing, MD;  Location: [facility];  Service: Orthopedics;  Laterality: Left;    Allergies:  No Known Allergies  Family History:  Family History  Problem Relation Age  of Onset   Stroke Mother    Diabetes Father     Social History:  Social History   Tobacco Use   Smoking status: Former    Types: Pipe   Smokeless tobacco: Never  Building services engineer Use: Never used  Substance Use Topics   Alcohol use: No   Drug use: No    ROS: Constitutional:  Negative for fever, chills, weight loss CV: Negative for chest pain, previous MI, hypertension Respiratory:  Negative for shortness of breath, wheezing, sleep apnea, frequent cough GI:  Negative for nausea, vomiting, bloody stool, GERD  Physical exam: BP 122/74   Pulse 81  GENERAL APPEARANCE:  Well  appearing, well developed, well nourished, NAD HEENT:  Atraumatic, normocephalic, oropharynx clear NECK:  Supple without lymphadenopathy or thyromegaly ABDOMEN:  Soft, non-tender, no masses EXTREMITIES:  Moves all extremities well, without clubbing, cyanosis, or edema NEUROLOGIC:  Alert and oriented x 3, in wheelchair, CN II-XII grossly intact MENTAL STATUS:  appropriate BACK:  Non-tender to palpation, No CVAT SKIN:  Warm, dry, and intact  Results: Patient unable to provide sample  PVR = 27 mL

## 2021-11-06 ENCOUNTER — Ambulatory Visit (HOSPITAL_COMMUNITY)
Admission: RE | Admit: 2021-11-06 | Discharge: 2021-11-06 | Disposition: A | Discharge status: Home / Self Care | Payer: Medicare Other | Source: Ambulatory Visit | Attending: Urology | Admitting: Urology

## 2021-11-06 DIAGNOSIS — R3129 Other microscopic hematuria: Secondary | ICD-10-CM | POA: Insufficient documentation

## 2021-11-14 ENCOUNTER — Ambulatory Visit: Payer: Medicare Other | Admitting: Urology

## 2021-11-15 ENCOUNTER — Emergency Department (HOSPITAL_COMMUNITY): Payer: Medicare Other

## 2021-11-15 ENCOUNTER — Inpatient Hospital Stay (HOSPITAL_COMMUNITY)
Admission: EM | Admit: 2021-11-15 | Discharge: 2021-11-18 | DRG: 872 | Disposition: A | Discharge status: Other Acute Inpatient Hospital | Payer: Medicare Other | Attending: Family Medicine | Admitting: Family Medicine

## 2021-11-15 ENCOUNTER — Telehealth: Payer: Self-pay

## 2021-11-15 ENCOUNTER — Encounter (HOSPITAL_COMMUNITY): Payer: Self-pay | Admitting: Emergency Medicine

## 2021-11-15 ENCOUNTER — Other Ambulatory Visit: Payer: Self-pay

## 2021-11-15 DIAGNOSIS — A419 Sepsis, unspecified organism: Principal | ICD-10-CM

## 2021-11-15 DIAGNOSIS — E782 Mixed hyperlipidemia: Secondary | ICD-10-CM | POA: Diagnosis present

## 2021-11-15 DIAGNOSIS — N189 Chronic kidney disease, unspecified: Secondary | ICD-10-CM | POA: Diagnosis not present

## 2021-11-15 DIAGNOSIS — M1990 Unspecified osteoarthritis, unspecified site: Secondary | ICD-10-CM | POA: Diagnosis present

## 2021-11-15 DIAGNOSIS — Z7984 Long term (current) use of oral hypoglycemic drugs: Secondary | ICD-10-CM

## 2021-11-15 DIAGNOSIS — J449 Chronic obstructive pulmonary disease, unspecified: Secondary | ICD-10-CM | POA: Diagnosis present

## 2021-11-15 DIAGNOSIS — N401 Enlarged prostate with lower urinary tract symptoms: Secondary | ICD-10-CM | POA: Diagnosis present

## 2021-11-15 DIAGNOSIS — N138 Other obstructive and reflux uropathy: Secondary | ICD-10-CM | POA: Diagnosis present

## 2021-11-15 DIAGNOSIS — K219 Gastro-esophageal reflux disease without esophagitis: Secondary | ICD-10-CM | POA: Diagnosis present

## 2021-11-15 DIAGNOSIS — E1165 Type 2 diabetes mellitus with hyperglycemia: Secondary | ICD-10-CM

## 2021-11-15 DIAGNOSIS — N1832 Chronic kidney disease, stage 3b: Secondary | ICD-10-CM | POA: Diagnosis present

## 2021-11-15 DIAGNOSIS — N390 Urinary tract infection, site not specified: Secondary | ICD-10-CM | POA: Diagnosis present

## 2021-11-15 DIAGNOSIS — E1122 Type 2 diabetes mellitus with diabetic chronic kidney disease: Secondary | ICD-10-CM | POA: Diagnosis present

## 2021-11-15 DIAGNOSIS — Z7982 Long term (current) use of aspirin: Secondary | ICD-10-CM | POA: Diagnosis not present

## 2021-11-15 DIAGNOSIS — F32A Depression, unspecified: Secondary | ICD-10-CM | POA: Diagnosis present

## 2021-11-15 DIAGNOSIS — N179 Acute kidney failure, unspecified: Secondary | ICD-10-CM | POA: Diagnosis present

## 2021-11-15 DIAGNOSIS — Z96642 Presence of left artificial hip joint: Secondary | ICD-10-CM | POA: Diagnosis present

## 2021-11-15 DIAGNOSIS — Z833 Family history of diabetes mellitus: Secondary | ICD-10-CM

## 2021-11-15 DIAGNOSIS — L0390 Cellulitis, unspecified: Secondary | ICD-10-CM | POA: Diagnosis present

## 2021-11-15 DIAGNOSIS — R21 Rash and other nonspecific skin eruption: Secondary | ICD-10-CM | POA: Diagnosis present

## 2021-11-15 DIAGNOSIS — Z794 Long term (current) use of insulin: Secondary | ICD-10-CM | POA: Diagnosis not present

## 2021-11-15 DIAGNOSIS — Z20822 Contact with and (suspected) exposure to covid-19: Secondary | ICD-10-CM | POA: Diagnosis present

## 2021-11-15 DIAGNOSIS — D631 Anemia in chronic kidney disease: Secondary | ICD-10-CM | POA: Diagnosis present

## 2021-11-15 DIAGNOSIS — Z87891 Personal history of nicotine dependence: Secondary | ICD-10-CM

## 2021-11-15 DIAGNOSIS — A4159 Other Gram-negative sepsis: Secondary | ICD-10-CM | POA: Diagnosis present

## 2021-11-15 DIAGNOSIS — R419 Unspecified symptoms and signs involving cognitive functions and awareness: Secondary | ICD-10-CM | POA: Diagnosis present

## 2021-11-15 DIAGNOSIS — Z823 Family history of stroke: Secondary | ICD-10-CM | POA: Diagnosis not present

## 2021-11-15 LAB — PROTIME-INR
INR: 1 (ref 0.8–1.2)
Prothrombin Time: 13.1 seconds (ref 11.4–15.2)

## 2021-11-15 LAB — LIPASE, BLOOD: Lipase: 37 U/L (ref 11–51)

## 2021-11-15 LAB — URINALYSIS, ROUTINE W REFLEX MICROSCOPIC
Bilirubin Urine: NEGATIVE
Glucose, UA: >=500 mg/dL — AB
Hgb urine dipstick: NEGATIVE
Ketones, ur: NEGATIVE mg/dL
Nitrite: NEGATIVE
Protein, ur: 30 mg/dL — AB
Specific Gravity, Urine: 1.013 (ref 1.005–1.030)
WBC, UA: >50 WBC/hpf — ABNORMAL HIGH (ref 0–5)
pH: 5 (ref 5.0–8.0)

## 2021-11-15 LAB — HEPATIC FUNCTION PANEL
ALT: 17 U/L (ref 0–44)
AST: 14 U/L — ABNORMAL LOW (ref 15–41)
Albumin: 3.5 g/dL (ref 3.5–5.0)
Alkaline Phosphatase: 54 U/L (ref 38–126)
Bilirubin, Direct: 0.1 mg/dL (ref 0.0–0.2)
Indirect Bilirubin: 0.4 mg/dL (ref 0.3–0.9)
Total Bilirubin: 0.5 mg/dL (ref 0.3–1.2)
Total Protein: 7.4 g/dL (ref 6.5–8.1)

## 2021-11-15 LAB — RESP PANEL BY RT-PCR (FLU A&B, COVID) ARPGX2
Influenza A by PCR: NEGATIVE
Influenza B by PCR: NEGATIVE
SARS Coronavirus 2 by RT PCR: NEGATIVE

## 2021-11-15 LAB — CBC
HCT: 35.2 % — ABNORMAL LOW (ref 39.0–52.0)
Hemoglobin: 11.2 g/dL — ABNORMAL LOW (ref 13.0–17.0)
MCH: 28.8 pg (ref 26.0–34.0)
MCHC: 31.8 g/dL (ref 30.0–36.0)
MCV: 90.5 fL (ref 80.0–100.0)
Platelets: 246 10*3/uL (ref 150–400)
RBC: 3.89 MIL/uL — ABNORMAL LOW (ref 4.22–5.81)
RDW: 13.4 % (ref 11.5–15.5)
WBC: 9.9 10*3/uL (ref 4.0–10.5)
nRBC: 0 % (ref 0.0–0.2)

## 2021-11-15 LAB — BASIC METABOLIC PANEL
Anion gap: 9 (ref 5–15)
BUN: 39 mg/dL — ABNORMAL HIGH (ref 8–23)
CO2: 26 mmol/L (ref 22–32)
Calcium: 9.4 mg/dL (ref 8.9–10.3)
Chloride: 101 mmol/L (ref 98–111)
Creatinine, Ser: 2.43 mg/dL — ABNORMAL HIGH (ref 0.61–1.24)
GFR, Estimated: 27 mL/min — ABNORMAL LOW (ref >60)
Glucose, Bld: 328 mg/dL — ABNORMAL HIGH (ref 70–99)
Potassium: 5.1 mmol/L (ref 3.5–5.1)
Sodium: 136 mmol/L (ref 135–145)

## 2021-11-15 LAB — LACTIC ACID, PLASMA
Lactic Acid, Venous: 2.9 mmol/L (ref 0.5–1.9)
Lactic Acid, Venous: 2.1 mmol/L (ref 0.5–1.9)

## 2021-11-15 LAB — APTT: aPTT: 30 seconds (ref 24–36)

## 2021-11-15 MED ORDER — SODIUM CHLORIDE 0.9 % IV SOLN
2.0000 g | INTRAVENOUS | Status: DC
  Administered 2021-11-15 – 2021-11-17 (×3): 2 g via INTRAVENOUS
  Filled 2021-11-15 (×3): qty 20

## 2021-11-15 MED ORDER — LACTATED RINGERS IV BOLUS (SEPSIS)
1000.0000 mL | Freq: Once | INTRAVENOUS | Status: AC
  Administered 2021-11-15: 1000 mL via INTRAVENOUS

## 2021-11-15 MED ORDER — LACTATED RINGERS IV SOLN: INTRAVENOUS | Status: AC

## 2021-11-15 NOTE — ED Provider Notes (Signed)
[facility] EMERGENCY DEPARTMENT Provider Note   CSN: [id number] Arrival date & time: 11/15/21  1651     History  Chief Complaint  Patient presents with   Dysuria    Douglas Cook is a 76 y.o. male with PMH significant for acute renal failure superimposed on stage III CKD, COPD, diabetes, acid reflux, depression, hip fracture, recurrent UTIs, neurocognitive disorder who is currently in a care facility presents with concern for urinary burning, lower back pain for 3 days. Pt here from assisted living facility with urinary burning and right lower back pain for the last 3 days.  Fevers seen and evaluated on 1 August for history of BPH, history of urinary obstruction, he had just completed a course of Bactrim at that time, he has been taking fosfomycin once every 10 days to help prevent urinary tract infections.  History of recurrent UTIs and sepsis.  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  Patient endorses some abdominal pain 7/10, but cannot describe exactly where it is. Dysuria Presenting symptoms: dysuria        Home Medications Prior to Admission medications   Medication Sig Start Date End Date Taking? Authorizing Provider  acetaminophen (TYLENOL) 325 MG tablet Take 2 tablets (650 mg total) by mouth every 6 (six) hours as needed for mild pain (or Fever >/= 101). 03/22/21   Sharee Holster, NP  albuterol (VENTOLIN HFA) 108 (90 Base) MCG/ACT inhaler Inhale 1-2 puffs into the lungs every 4 (four) hours as needed for wheezing or shortness of breath.    [provider]  alfuzosin (UROXATRAL) 10 MG 24 hr tablet Take 1 tablet (10 mg total) by mouth daily with breakfast. 10/24/21   Stoneking, Danford Bad., MD  Alpha-Lipoic Acid 100 MG CAPS Take 300 mg by mouth daily. 08/30/21   [provider]  Alpha-Lipoic Acid 300 MG CAPS Take 1 capsule (300 mg total) by mouth daily at 12 noon. At 10 pm Patient not taking: Reported on 09/17/2021 03/22/21   Sharee Holster, NP   aspirin EC 81 MG tablet Take 1 tablet (81 mg total) by mouth daily. Swallow whole. 03/22/21   Sharee Holster, NP  atorvastatin (LIPITOR) 20 MG tablet Take 1 tablet (20 mg total) by mouth daily. 03/22/21   Sharee Holster, NP  cetirizine (ZYRTEC) 10 MG tablet Take 1 tablet (10 mg total) by mouth at bedtime. 03/22/21   Sharee Holster, NP  cholecalciferol (VITAMIN D) 25 MCG (1000 UNIT) tablet Take 1 tablet (1,000 Units total) by mouth daily. 03/22/21   Sharee Holster, NP  citalopram (CELEXA) 20 MG tablet Take 1 tablet (20 mg total) by mouth at bedtime. 03/22/21   Sharee Holster, NP  feeding supplement, GLUCERNA SHAKE, (GLUCERNA SHAKE) LIQD Take 237 mLs by mouth daily. Patient not taking: Reported on 10/24/2021 03/22/21   Sharee Holster, NP  FEROSUL 325 (65 Fe) MG tablet Take 1 tablet (325 mg total) by mouth 2 (two) times daily. 03/22/21   Sharee Holster, NP  finasteride (PROSCAR) 5 MG tablet Take 5 mg by mouth daily. 09/09/21   [provider]  fluticasone (FLONASE) 50 MCG/ACT nasal spray Place 1 spray into both nostrils daily. 03/22/21   Sharee Holster, NP  Ipratropium-Albuterol (COMBIVENT RESPIMAT) 20-100 MCG/ACT AERS respimat Inhale 2 puffs into the lungs 2 (two) times daily. 03/22/21   Sharee Holster, NP  lamoTRIgine (LAMICTAL) 100 MG tablet Take 1 tablet (100 mg total) by mouth daily. 03/22/21  Sharee Holster, NP  LEVEMIR FLEXTOUCH 100 UNIT/ML FlexTouch Pen Inject 30 Units into the skin at bedtime. Patient taking differently: Inject 50 Units into the skin at bedtime. 03/22/21   Sharee Holster, NP  metFORMIN (GLUCOPHAGE-XR) 500 MG 24 hr tablet Take 2 tablets (1,000 mg total) by mouth daily with breakfast. 03/22/21   Sharee Holster, NP  NOVOLOG FLEXPEN 100 UNIT/ML FlexPen Inject 8 Units into the skin 3 (three) times daily. 03/22/21   [provider]  omeprazole (PRILOSEC) 20 MG capsule Take 1 capsule (20 mg total) by mouth daily. 03/22/21   Sharee Holster,  NP  Propylene Glycol (SYSTANE BALANCE) 0.6 % SOLN Apply 1 drop to eye 2 (two) times daily. Both eyes 03/22/21   Sharee Holster, NP  risperiDONE (RISPERDAL) 0.5 MG tablet Take 1 tablet (0.5 mg total) by mouth at bedtime. 03/22/21   Sharee Holster, NP  senna-docusate (SENOKOT-S) 8.6-50 MG tablet Take 1 tablet by mouth at bedtime as needed for mild constipation. 03/22/21   Sharee Holster, NP  TRADJENTA 5 MG TABS tablet Take 1 tablet (5 mg total) by mouth daily. 03/22/21   Sharee Holster, NP  traZODone (DESYREL) 100 MG tablet Take 1 tablet (100 mg total) by mouth at bedtime. 03/22/21   Sharee Holster, NP      Allergies    Patient has no known allergies.    Review of Systems   Review of Systems  Genitourinary:  Positive for dysuria.  All other systems reviewed and are negative.   Physical Exam Updated Vital Signs BP 116/70   Pulse 80   Temp 97.8 F (36.6 C) (Oral)   Resp 15   SpO2 100%  Physical Exam Vitals and nursing note reviewed.  Constitutional:      General: He is not in acute distress.    Appearance: Normal appearance.  HENT:     Head: Normocephalic and atraumatic.  Eyes:     General:        Right eye: No discharge.        Left eye: No discharge.  Cardiovascular:     Rate and Rhythm: Regular rhythm. Tachycardia present.     Heart sounds: No murmur heard.    No friction rub. No gallop.  Pulmonary:     Effort: Pulmonary effort is normal.     Breath sounds: Normal breath sounds.  Abdominal:     General: Bowel sounds are normal.     Palpations: Abdomen is soft.  Skin:    General: Skin is warm and dry.     Capillary Refill: Capillary refill takes less than 2 seconds.  Neurological:     Mental Status: He is alert.     Comments: Patient is able to tell me his name and that he is having urinary problem  Psychiatric:        Mood and Affect: Mood normal.        Behavior: Behavior normal.     ED Results / Procedures / Treatments   Labs (all labs ordered  are listed, but only abnormal results are displayed) Labs Reviewed  BASIC METABOLIC PANEL - Abnormal; Notable for the following components:      Result Value   Glucose, Bld 328 (*)    BUN 39 (*)    Creatinine, Ser 2.43 (*)    GFR, Estimated 27 (*)    All other components within normal limits  CBC - Abnormal; Notable for the following components:  RBC 3.89 (*)    Hemoglobin 11.2 (*)    HCT 35.2 (*)    All other components within normal limits  URINALYSIS, ROUTINE W REFLEX MICROSCOPIC - Abnormal; Notable for the following components:   APPearance CLOUDY (*)    Glucose, UA >=500 (*)    Protein, ur 30 (*)    Leukocytes,Ua LARGE (*)    WBC, UA >50 (*)    Bacteria, UA MANY (*)    All other components within normal limits  LACTIC ACID, PLASMA - Abnormal; Notable for the following components:   Lactic Acid, Venous 2.9 (*)    All other components within normal limits  HEPATIC FUNCTION PANEL - Abnormal; Notable for the following components:   AST 14 (*)    All other components within normal limits  RESP PANEL BY RT-PCR (FLU A&B, COVID) ARPGX2  CULTURE, BLOOD (ROUTINE X 2)  CULTURE, BLOOD (ROUTINE X 2)  URINE CULTURE  LIPASE, BLOOD  PROTIME-INR  APTT  LACTIC ACID, PLASMA    EKG None  Radiology DG Chest Port 1 View  Result Date: 11/15/2021 CLINICAL DATA:  Dysuria possible sepsis EXAM: PORTABLE CHEST 1 VIEW COMPARISON:  09/17/2021 FINDINGS: The heart size and mediastinal contours are within normal limits. Both lungs are clear. The visualized skeletal structures are unremarkable. IMPRESSION: No active disease. Electronically Signed   By: Donavan Foil M.D.   On: 11/15/2021 21:36    Procedures Procedures    Medications Ordered in ED Medications  lactated ringers infusion (has no administration in time range)  cefTRIAXone (ROCEPHIN) 2 g in sodium chloride 0.9 % 100 mL IVPB (0 g Intravenous Stopped 11/15/21 2138)  lactated ringers bolus 1,000 mL (0 mLs Intravenous Stopped 11/15/21  2205)    And  lactated ringers bolus 1,000 mL (1,000 mLs Intravenous New Bag/Given 11/15/21 2111)    And  lactated ringers bolus 1,000 mL (1,000 mLs Intravenous New Bag/Given 11/15/21 2205)    ED Course/ Medical Decision Making/ A&P                           Medical Decision Making Amount and/or Complexity of Data Reviewed Labs: ordered. Radiology: ordered.  Risk Prescription drug management.   This patient is a 76 y.o. male who presents to the ED for concern of dysuria, back pain, this involves an extensive number of treatment options, and is a complaint that carries with it a high risk of complications and morbidity. The emergent differential diagnosis prior to evaluation includes, but is not limited to, urinary tract infection, acute urinary retention, pyelonephritis, nephrolithiasis, sepsis due to UTI, versus other.   This is not an exhaustive differential.   Past Medical History / Co-morbidities / Social History: acute renal failure superimposed on stage III CKD, COPD, diabetes, acid reflux, depression, hip fracture, recurrent UTIs, neurocognitive disorder  Additional history: Chart reviewed. Pertinent results include: I reviewed the paperwork that patient was sent from his facility.  It seems that he sees Dr. Felipa Eth with urology due to BPH with obstruction.  He had been on Bactrim and had been UTI free at the start of August, the plan with Dr. Felipa Eth was to place him on fosfomycin once every 10 days to prevent an acute UTI with his history of same.  Physical Exam: Physical exam performed. The pertinent findings include: Patient is not in acute distress but arrives with sepsis vital signs, he is borderline tachypneic with respirations of 20, he is tachycardic at 115,  and febrile at 100.4.  Clinically however he is not in severe acute distress, he has some tenderness palpation of the abdomen and back.    Lab Tests: I ordered, and personally interpreted labs.  The pertinent  results include:  RVP negative, PT/INR, APTT are both normal.  His UA is suggestive of a acute UTI with protein, large leukocytes, greater than 50 white blood cells and many bacteria.  There are white blood cell clumps present.  Lactic acid is initially elevated at 2.9 which adds to his septic presentation.  His hepatic function panel is unremarkable, BMP is notable for hyperglycemia, glucose 328, he has acute worsening kidney function compared to baseline, BUN 39, creatinine 2.43 compared to baseline of BUN 30, creatinine 1.69 from 3 weeks ago.  His CBC is without leukocytosis, however this may be masked by the fact that he has been on antibiotics near continuously since his last evaluation.  His hemoglobin is minimally anemic at 11.2.  He has normal platelets.   Imaging Studies: I ordered imaging studies including plain film chest x-ray. I independently visualized and interpreted imaging which showed no intrathoracic abnormality. I agree with the radiologist interpretation.   Medications: I ordered medication including LR fluid bolus for sepsis, ceftriaxone for presumed urinary source. Reevaluation of the patient after these medicines showed that the patient improved.  Patient with significant improvement of vital signs on recheck, he is no longer febrile or tachycardic or tachypneic, however given his initial presentation I do still think that he would benefit from admission at least for monitoring overnight.  He will require recheck of his response to treatment.  Consultations Obtained: I requested consultation with the hospitalist, spoke with Dr. Thomes Dinning,  and discussed lab and imaging findings as well as pertinent plan - they recommend: Admission   Disposition: After consideration of the diagnostic results and the patients response to treatment, I feel that patient would benefit from admission as discussed above.  I discussed this case with my attending physician Dr. Wallace Cullens who cosigned this note  including patient's presenting symptoms, physical exam, and planned diagnostics and interventions. Attending physician stated agreement with plan or made changes to plan which were implemented.    Final Clinical Impression(s) / ED Diagnoses Final diagnoses:  Sepsis, due to unspecified organism, unspecified whether acute organ dysfunction present [facility])    Rx / DC Orders ED Discharge Orders     None         Olene Floss, PA-C 11/15/21 2225    Franne Forts, DO 11/24/21 2303

## 2021-11-15 NOTE — ED Notes (Signed)
Rocephin was hung at 2053 due to inability to get 2nd set of blood cultures. 2nd set of blood cultures were finally obtained with 2nd iv start.

## 2021-11-15 NOTE — ED Provider Triage Note (Signed)
Emergency Medicine Provider Triage Evaluation Note  Douglas Cook , a 76 y.o. male  was evaluated in triage.  Pt here from assisted living facility with urinary burning and right lower back pain for the last 3 days.  Fevers seen and evaluated on 1 August for history of BPH, history of urinary obstruction, he had just completed a course of Bactrim at that time, he has been taking fosfomycin once every 10 days to help prevent urinary tract infections.  History of recurrent UTIs and sepsis.  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  Patient endorses some abdominal pain 7/10, but cannot describe exactly where it is..  Review of Systems  Positive: Dysuria, fever, chills, abdominal pain, back pain Negative: Nausea, vomiting, diarrhea  Physical Exam  BP 104/84 (BP Location: Right Arm)   Pulse (!) 115   Temp (!) 100.4 F (38 C) (Oral)   Resp 20   SpO2 93%  Gen:   Awake, no distress   Resp:  Normal effort  MSK:   Moves extremities without difficulty  Other:  TTP in abdomen, worst suprapubically  Medical Decision Making  Medically screening exam initiated at 7:29 PM.  Appropriate orders placed.  Douglas Cook was informed that the remainder of the evaluation will be completed by another provider, this initial triage assessment does not replace that evaluation, and the importance of remaining in the ED until their evaluation is complete.  Workup initiated   Douglas Cook, [place] 11/15/21 1933

## 2021-11-15 NOTE — ED Provider Triage Note (Signed)
Emergency Medicine Provider Triage Evaluation Note  ELZIE KNISLEY , a 76 y.o. male  was evaluated in triage.  Pt complains of dysuria x 4 days. States he feels like he is not able to empty is bladder completely. Denies fever, chills, nausea, vomiting, or abdominal pain. Denies previous hx of UTIs.  Review of Systems  Positive: dysuria Negative: Fever, chills, abdominal pain, flank pain.   Physical Exam  BP 104/84 (BP Location: Right Arm)   Pulse (!) 115   Temp (!) 100.4 F (38 C) (Oral)   Resp 20   SpO2 93%  Gen:   Awake, no distress  well appearing, non toxic Resp:  Normal effort  MSK:   Moves extremities without difficulty  Other:  Soft nontender abdomen.   Medical Decision Making  Medically screening exam initiated at 7:32 PM.  Appropriate orders placed.  YOUSEF HUGE was informed that the remainder of the evaluation will be completed by another provider, this initial triage assessment does not replace that evaluation, and the importance of remaining in the ED until their evaluation is complete.  Febrile in triage. Labs ordered to r/o sepsis.  UA pending     Franne Forts, DO 11/15/21 1936

## 2021-11-15 NOTE — ED Triage Notes (Signed)
Urinary burning and lower right back pain x 3 days. Pt from [facility] assisted living. A/o.

## 2021-11-15 NOTE — Telephone Encounter (Signed)
Opened in error

## 2021-11-15 NOTE — Sepsis Progress Note (Signed)
Following per sepsis protocol   

## 2021-11-15 NOTE — H&P (Signed)
History and Physical    Patient: Douglas Cook:[id number] DOB: Aug 19, 1945 DOA: 11/15/2021 DOS: the patient was seen and examined on 11/16/2021 PCP: Glenda Chroman, MD  Patient coming from: SNF  Chief Complaint:  Chief Complaint  Patient presents with   Dysuria   HPI: Douglas Cook is a 76 y.o. male with medical history significant of COPD, GERD, hyperlipidemia, T2DM, acute kidney injury on CKD 3B who presents emergency department via EMS due to 3-day onset of burning sensation on urination and right-sided lower back pain.  Patient was unable to provide detailed history regarding his condition other than having burning sensation on urination, further history was obtained from ED PA and ED medical record.  Per report, patient was reported to have fever and was evaluated on August 1 due to history of BPH and urinary obstruction, patient was reported to have completed a course of Bactrim at that time and was placed on fosfomycin once every 10 days to prevent UTI due to history of recurrent UTIs. He was recently admitted from June 25 to June 28 due to UTI and was treated with IV Rocephin.  ED Course:  In the emergency department, he was febrile with a temperature of 100.19F, pulse 115/min, other vital signs were within normal range.  Work-up in the ED showed normocytic anemia, hyperglycemia, BUN/creatinine 39/2.43 (baseline creatinine at 1.6-1.9).  Urinalysis was positive for UTI and glycosuria.  Lactic acid 2.9 > 2.1.  Influenza A, B, SARS coronavirus 2 was negative.  Blood culture was pending. Chest x-ray showed no active disease Patient was treated with IV ceftriaxone, IV hydration was provided per sepsis protocol.  Hospitalist was asked to admit patient for further evaluation and management.  Review of Systems: Review of systems as noted in the HPI. All other systems reviewed and are negative.   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 His smoking use included pipe. He has never used smokeless tobacco. He reports that he does not drink alcohol and does not use drugs.   No Known Allergies  Family History  Problem Relation Age of Onset   Stroke Mother    Diabetes Father      Prior to Admission medications   Medication Sig Start Date End Date Taking? Authorizing Provider  acetaminophen (TYLENOL) 325 MG tablet Take 2 tablets (650 mg total) by mouth every 6 (six) hours as needed for mild pain (or Fever >/= 101). 03/22/21   Gerlene Fee, NP  albuterol (VENTOLIN HFA) 108 (90 Base) MCG/ACT inhaler Inhale 1-2 puffs into the lungs every 4 (four) hours as needed for wheezing or shortness of breath.    [provider]  alfuzosin (UROXATRAL) 10 MG 24 hr tablet Take 1 tablet (10 mg total) by mouth daily with breakfast. 10/24/21   Felipa Eth, Leory Plowman  J., MD  Alpha-Lipoic Acid 100 MG CAPS Take 300 mg by mouth daily. 08/30/21   [provider]  Alpha-Lipoic Acid 300 MG CAPS Take 1 capsule (300 mg total) by mouth daily at 12 noon. At 10 pm Patient not taking: Reported on 09/17/2021 03/22/21   Gerlene Fee, NP  aspirin EC 81 MG tablet Take 1 tablet (81 mg total) by mouth daily. Swallow whole. 03/22/21   Gerlene Fee, NP  atorvastatin (LIPITOR) 20 MG tablet Take 1 tablet (20 mg total) by mouth daily. 03/22/21   Gerlene Fee,  NP  cetirizine (ZYRTEC) 10 MG tablet Take 1 tablet (10 mg total) by mouth at bedtime. 03/22/21   Gerlene Fee, NP  cholecalciferol (VITAMIN D) 25 MCG (1000 UNIT) tablet Take 1 tablet (1,000 Units total) by mouth daily. 03/22/21   Gerlene Fee, NP  citalopram (CELEXA) 20 MG tablet Take 1 tablet (20 mg total) by mouth at bedtime. 03/22/21   Gerlene Fee, NP  feeding supplement, GLUCERNA SHAKE, (GLUCERNA SHAKE) LIQD Take 237 mLs by mouth daily. Patient not taking: Reported on 10/24/2021 03/22/21   Gerlene Fee, NP  FEROSUL 325 (65 Fe) MG tablet Take 1 tablet (325 mg total) by mouth 2 (two) times daily. 03/22/21   Gerlene Fee, NP  finasteride (PROSCAR) 5 MG tablet Take 5 mg by mouth daily. 09/09/21   [provider]  fluticasone (FLONASE) 50 MCG/ACT nasal spray Place 1 spray into both nostrils daily. 03/22/21   Gerlene Fee, NP  Ipratropium-Albuterol (COMBIVENT RESPIMAT) 20-100 MCG/ACT AERS respimat Inhale 2 puffs into the lungs 2 (two) times daily. 03/22/21   Gerlene Fee, NP  lamoTRIgine (LAMICTAL) 100 MG tablet Take 1 tablet (100 mg total) by mouth daily. 03/22/21   Gerlene Fee, NP  LEVEMIR FLEXTOUCH 100 UNIT/ML FlexTouch Pen Inject 30 Units into the skin at bedtime. Patient taking differently: Inject 50 Units into the skin at bedtime. 03/22/21   Gerlene Fee, NP  metFORMIN (GLUCOPHAGE-XR) 500 MG 24 hr tablet Take 2 tablets (1,000 mg total) by mouth daily with breakfast. 03/22/21   Gerlene Fee, NP  NOVOLOG FLEXPEN 100 UNIT/ML FlexPen Inject 8 Units into the skin 3 (three) times daily. 03/22/21   [provider]  omeprazole (PRILOSEC) 20 MG capsule Take 1 capsule (20 mg total) by mouth daily. 03/22/21   Gerlene Fee, NP  Propylene Glycol (SYSTANE BALANCE) 0.6 % SOLN Apply 1 drop to eye 2 (two) times daily. Both eyes 03/22/21   Gerlene Fee, NP  risperiDONE (RISPERDAL) 0.5 MG tablet Take 1 tablet (0.5 mg total) by mouth at bedtime.  03/22/21   Gerlene Fee, NP  senna-docusate (SENOKOT-S) 8.6-50 MG tablet Take 1 tablet by mouth at bedtime as needed for mild constipation. 03/22/21   Gerlene Fee, NP  TRADJENTA 5 MG TABS tablet Take 1 tablet (5 mg total) by mouth daily. 03/22/21   Gerlene Fee, NP  traZODone (DESYREL) 100 MG tablet Take 1 tablet (100 mg total) by mouth at bedtime. 03/22/21   Gerlene Fee, NP    Physical Exam: BP 117/71   Pulse 76   Temp 97.8 F (36.6 C) (Oral)   Resp 18   SpO2 99%   General: 75 y.o. year-old male well developed well nourished in no acute distress.  Alert and oriented x3. HEENT: NCAT, EOMI Neck: Supple, trachea medial Cardiovascular: Tachycardia.  Regular rate and rhythm with no rubs or gallops.  No thyromegaly or JVD noted.  No lower extremity edema. 2/4 pulses in all 4 extremities. Respiratory: Clear to auscultation with no wheezes or rales. Good inspiratory effort. Abdomen: Soft, nontender nondistended with normal bowel sounds x4 quadrants. Muskuloskeletal: No cyanosis, clubbing or edema noted bilaterally Neuro: CN II-XII intact, strength 5/5 x 4, sensation, reflexes intact Skin: No ulcerative lesions noted or rashes Psychiatry: Judgement and insight appear normal. Mood is appropriate for condition and setting          Labs on Admission:  Basic Metabolic Panel: Recent Labs  Lab 11/15/21 1838  NA 136  K 5.1  CL 101  CO2 26  GLUCOSE 328*  BUN 39*  CREATININE 2.43*  CALCIUM 9.4   Liver Function Tests: Recent Labs  Lab 11/15/21 1930  AST 14*  ALT 17  ALKPHOS 54  BILITOT 0.5  PROT 7.4  ALBUMIN 3.5   Recent Labs  Lab 11/15/21 1930  LIPASE 37   No results for input(s): "AMMONIA" in the last 168 hours. CBC: Recent Labs  Lab 11/15/21 1838  WBC 9.9  HGB 11.2*  HCT 35.2*  MCV 90.5  PLT 246   Cardiac Enzymes: No results for input(s): "CKTOTAL", "CKMB", "CKMBINDEX", "TROPONINI" in the last 168 hours.  BNP (last 3 results) No results for  input(s): "BNP" in the last 8760 hours.  ProBNP (last 3 results) No results for input(s): "PROBNP" in the last 8760 hours.  CBG: No results for input(s): "GLUCAP" in the last 168 hours.  Radiological Exams on Admission: DG Chest Port 1 View  Result Date: 11/15/2021 CLINICAL DATA:  Dysuria possible sepsis EXAM: PORTABLE CHEST 1 VIEW COMPARISON:  09/17/2021 FINDINGS: The heart size and mediastinal contours are within normal limits. Both lungs are clear. The visualized skeletal structures are unremarkable. IMPRESSION: No active disease. Electronically Signed   By: Donavan Foil M.D.   On: 11/15/2021 21:36    EKG: I independently viewed the EKG done and my findings are as followed: Normal sinus rhythm with PAC with aberrant conduction at a rate of 86 bpm  Assessment/Plan Present on Admission:  Sepsis secondary to UTI ([place])  COPD (chronic obstructive pulmonary disease) ([place])  Depression  GERD (gastroesophageal reflux disease)  Principal Problem:   Sepsis secondary to UTI [facility]) Active Problems:   Acute kidney injury superimposed on CKD ([place])   COPD (chronic obstructive pulmonary disease) (HCC)   GERD (gastroesophageal reflux disease)   Mixed hyperlipidemia   Depression   Uncontrolled type 2 diabetes mellitus with hyperglycemia, with long-term current use of insulin ([place])  Sepsis secondary to UTI POA Patient was febrile and tachycardic (met SIRS criteria).  Source of infection was UTI. 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 Renally adjust medications, avoid nephrotoxic agents/dehydration/hypotension  Type 2 diabetes mellitus with uncontrolled hyperglycemia Continue Levemir 15 units nightly (patient takes 50 units at bedtime at home) and adjust dose accordingly Continue ISS and hypoglycemia  protocol  GERD Continue Protonix  COPD Continue albuterol, Combivent  Mixed hyperlipidemia Continue Lipitor  BPH Continue finasteride and alfuzosin  Depression Continue risperidone, Celexa  DVT prophylaxis: Heparin subcu  Code Status: Full code  Consults: None  Family Communication: None at bedside  Severity of Illness: The appropriate patient status for this patient is INPATIENT. Inpatient status is judged to be reasonable and necessary in order to  provide the required intensity of service to ensure the patient's safety. The patient's presenting symptoms, physical exam findings, and initial radiographic and laboratory data in the context of their chronic comorbidities is felt to place them at high risk for further clinical deterioration. Furthermore, it is not anticipated that the patient will be medically stable for discharge from the hospital within 2 midnights of admission.   * I certify that at the point of admission it is my clinical judgment that the patient will require inpatient hospital care spanning beyond 2 midnights from the point of admission due to high intensity of service, high risk for further deterioration and high frequency of surveillance required.*  Author: Bernadette Hoit, DO 11/16/2021 2:58 AM  For on call review [email or web address].

## 2021-11-15 NOTE — Telephone Encounter (Signed)
Home Health Nurse, Selena Batten with [facility], called with patient blood sugar being elevated and now complains of UTI symptoms.  Verbal order given for UA/UC

## 2021-11-16 DIAGNOSIS — N390 Urinary tract infection, site not specified: Secondary | ICD-10-CM | POA: Diagnosis not present

## 2021-11-16 DIAGNOSIS — A419 Sepsis, unspecified organism: Secondary | ICD-10-CM | POA: Diagnosis not present

## 2021-11-16 DIAGNOSIS — E1165 Type 2 diabetes mellitus with hyperglycemia: Secondary | ICD-10-CM

## 2021-11-16 LAB — CBC
HCT: 32.5 % — ABNORMAL LOW (ref 39.0–52.0)
Hemoglobin: 10.5 g/dL — ABNORMAL LOW (ref 13.0–17.0)
MCH: 29.3 pg (ref 26.0–34.0)
MCHC: 32.3 g/dL (ref 30.0–36.0)
MCV: 90.8 fL (ref 80.0–100.0)
Platelets: 207 10*3/uL (ref 150–400)
RBC: 3.58 MIL/uL — ABNORMAL LOW (ref 4.22–5.81)
RDW: 13.3 % (ref 11.5–15.5)
WBC: 7.3 10*3/uL (ref 4.0–10.5)
nRBC: 0 % (ref 0.0–0.2)

## 2021-11-16 LAB — GLUCOSE, CAPILLARY
Glucose-Capillary: 244 mg/dL — ABNORMAL HIGH (ref 70–99)
Glucose-Capillary: 234 mg/dL — ABNORMAL HIGH (ref 70–99)

## 2021-11-16 LAB — COMPREHENSIVE METABOLIC PANEL
ALT: 27 U/L (ref 0–44)
AST: 40 U/L (ref 15–41)
Albumin: 3 g/dL — ABNORMAL LOW (ref 3.5–5.0)
Alkaline Phosphatase: 40 U/L (ref 38–126)
Anion gap: 4 — ABNORMAL LOW (ref 5–15)
BUN: 32 mg/dL — ABNORMAL HIGH (ref 8–23)
CO2: 31 mmol/L (ref 22–32)
Calcium: 9 mg/dL (ref 8.9–10.3)
Chloride: 104 mmol/L (ref 98–111)
Creatinine, Ser: 1.81 mg/dL — ABNORMAL HIGH (ref 0.61–1.24)
GFR, Estimated: 39 mL/min — ABNORMAL LOW (ref >60)
Glucose, Bld: 261 mg/dL — ABNORMAL HIGH (ref 70–99)
Potassium: 4.2 mmol/L (ref 3.5–5.1)
Sodium: 139 mmol/L (ref 135–145)
Total Bilirubin: 0.5 mg/dL (ref 0.3–1.2)
Total Protein: 6.5 g/dL (ref 6.5–8.1)

## 2021-11-16 LAB — CBG MONITORING, ED
Glucose-Capillary: 235 mg/dL — ABNORMAL HIGH (ref 70–99)
Glucose-Capillary: 298 mg/dL — ABNORMAL HIGH (ref 70–99)

## 2021-11-16 LAB — PHOSPHORUS: Phosphorus: 3.7 mg/dL (ref 2.5–4.6)

## 2021-11-16 LAB — MAGNESIUM: Magnesium: 1.6 mg/dL — ABNORMAL LOW (ref 1.7–2.4)

## 2021-11-16 LAB — APTT: aPTT: 31 seconds (ref 24–36)

## 2021-11-16 MED ORDER — CITALOPRAM HYDROBROMIDE 20 MG PO TABS
20.0000 mg | ORAL_TABLET | Freq: Every day | ORAL | Status: DC
  Administered 2021-11-16 – 2021-11-17 (×2): 20 mg via ORAL
  Filled 2021-11-16 (×2): qty 1

## 2021-11-16 MED ORDER — INSULIN ASPART 100 UNIT/ML IJ SOLN
0.0000 [IU] | Freq: Every day | INTRAMUSCULAR | Status: DC
  Administered 2021-11-16: 2 [IU] via SUBCUTANEOUS
  Administered 2021-11-17: 3 [IU] via SUBCUTANEOUS

## 2021-11-16 MED ORDER — MAGNESIUM SULFATE 2 GM/50ML IV SOLN
2.0000 g | Freq: Once | INTRAVENOUS | Status: AC
  Administered 2021-11-16: 2 g via INTRAVENOUS
  Filled 2021-11-16: qty 50

## 2021-11-16 MED ORDER — IPRATROPIUM-ALBUTEROL 20-100 MCG/ACT IN AERS
1.0000 | INHALATION_SPRAY | Freq: Two times a day (BID) | RESPIRATORY_TRACT | Status: DC
Start: 2021-11-16 — End: 2021-11-18
  Administered 2021-11-16 – 2021-11-18 (×5): 1 via RESPIRATORY_TRACT
  Filled 2021-11-16: qty 4

## 2021-11-16 MED ORDER — ALFUZOSIN HCL ER 10 MG PO TB24
10.0000 mg | ORAL_TABLET | Freq: Every day | ORAL | Status: DC
  Administered 2021-11-16 – 2021-11-18 (×3): 10 mg via ORAL
  Filled 2021-11-16 (×3): qty 1

## 2021-11-16 MED ORDER — ATORVASTATIN CALCIUM 20 MG PO TABS
20.0000 mg | ORAL_TABLET | Freq: Every day | ORAL | Status: DC
  Administered 2021-11-16 – 2021-11-18 (×3): 20 mg via ORAL
  Filled 2021-11-16: qty 2
  Filled 2021-11-16 (×2): qty 1

## 2021-11-16 MED ORDER — PANTOPRAZOLE SODIUM 40 MG PO TBEC
40.0000 mg | DELAYED_RELEASE_TABLET | Freq: Every day | ORAL | Status: DC
  Administered 2021-11-16 – 2021-11-18 (×3): 40 mg via ORAL
  Filled 2021-11-16 (×3): qty 1

## 2021-11-16 MED ORDER — ALBUTEROL SULFATE (2.5 MG/3ML) 0.083% IN NEBU
2.5000 mg | INHALATION_SOLUTION | RESPIRATORY_TRACT | Status: DC | PRN
Start: 2021-11-16 — End: 2021-11-18

## 2021-11-16 MED ORDER — INSULIN ASPART 100 UNIT/ML IJ SOLN
0.0000 [IU] | Freq: Three times a day (TID) | INTRAMUSCULAR | Status: DC
  Administered 2021-11-16 (×2): 5 [IU] via SUBCUTANEOUS
  Administered 2021-11-16 – 2021-11-17 (×2): 3 [IU] via SUBCUTANEOUS
  Administered 2021-11-17 (×2): 2 [IU] via SUBCUTANEOUS
  Administered 2021-11-18 (×2): 3 [IU] via SUBCUTANEOUS
  Filled 2021-11-16: qty 1

## 2021-11-16 MED ORDER — FINASTERIDE 5 MG PO TABS
5.0000 mg | ORAL_TABLET | Freq: Every day | ORAL | Status: DC
  Administered 2021-11-16 – 2021-11-18 (×3): 5 mg via ORAL
  Filled 2021-11-16 (×3): qty 1

## 2021-11-16 MED ORDER — INSULIN DETEMIR 100 UNIT/ML ~~LOC~~ SOLN
15.0000 [IU] | Freq: Every day | SUBCUTANEOUS | Status: DC
  Administered 2021-11-16 – 2021-11-17 (×2): 15 [IU] via SUBCUTANEOUS
  Filled 2021-11-16 (×3): qty 0.15

## 2021-11-16 MED ORDER — RISPERIDONE 0.5 MG PO TABS
0.5000 mg | ORAL_TABLET | Freq: Every day | ORAL | Status: DC
  Administered 2021-11-16 – 2021-11-17 (×2): 0.5 mg via ORAL
  Filled 2021-11-16 (×2): qty 1

## 2021-11-16 MED ORDER — ALBUTEROL SULFATE HFA 108 (90 BASE) MCG/ACT IN AERS: 1.0000 | INHALATION_SPRAY | RESPIRATORY_TRACT | Status: DC | PRN

## 2021-11-16 MED ORDER — HEPARIN SODIUM (PORCINE) 5000 UNIT/ML IJ SOLN
5000.0000 [IU] | Freq: Three times a day (TID) | INTRAMUSCULAR | Status: DC
  Administered 2021-11-16 – 2021-11-18 (×8): 5000 [IU] via SUBCUTANEOUS
  Filled 2021-11-16 (×8): qty 1

## 2021-11-16 NOTE — Progress Notes (Signed)
PROGRESS NOTE     Douglas Cook, is a 76 y.o. male, DOB - [birth year], ONG:[id number]  Admit date - 11/15/2021   Admitting Physician Frankey Shown, DO  Outpatient Primary MD for the patient is Ignatius Specking, MD  LOS - 1  Chief Complaint  Patient presents with   Dysuria        Brief Narrative:  76 y.o. male with medical history significant of COPD, GERD, hyperlipidemia, T2DM, acute kidney injury on CKD 3B admitted on 11/15/2021 with concerns for sepsis secondary to urinary source    -Assessment and Plan:  1) sepsis secondary to presumed urinary source----continue IV Rocephin pending culture data  2) AKI on CKD 3B--- anticipate improvement with treatment of UTI and hydration  3)Rash--- please see photos in epic, -States rash is not itchy -??  If related to infection/sepsis as above #1  4)COPD--no acute exacerbation, continue pushing alert times  5)DM2- .continue scheduled insulin and use Novolog/Humalog Sliding scale insulin with Accu-Cheks/Fingersticks as ordered    Disposition/Need for in-Hospital Stay- patient unable to be discharged at this time due to sepsis secondary to presumed UTI requiring IV antibiotics and IV fluids*  Status is: Inpatient   Disposition: The patient is from: [facility]              Anticipated d/c is to: ALF              Anticipated d/c date is: 2 days              Patient currently is not medically stable to d/c. Barriers: Not Clinically Stable- =  Code Status : - -  Code Status: Full Code   Family Communication:   -(patient is alert, awake and coherent)   DVT Prophylaxis  :   - SCDs  heparin injection 5,000 Units Start: 11/16/21 0600 SCDs Start: 11/16/21 0233   Lab Results  Component Value Date   PLT 207 11/16/2021    Inpatient Medications  Scheduled Meds:  alfuzosin  10 mg Oral Q breakfast   atorvastatin  20 mg Oral Daily   citalopram  20 mg Oral QHS   finasteride  5 mg Oral Daily   heparin  5,000 Units Subcutaneous Q8H    insulin aspart  0-5 Units Subcutaneous QHS   insulin aspart  0-9 Units Subcutaneous TID WC   insulin detemir  15 Units Subcutaneous QHS   Ipratropium-Albuterol  1 puff Inhalation BID   pantoprazole  40 mg Oral Daily   risperiDONE  0.5 mg Oral QHS   Continuous Infusions:  cefTRIAXone (ROCEPHIN)  IV Stopped (11/15/21 2138)   PRN Meds:.albuterol   Anti-infectives (From admission, onward)    Start     Dose/Rate Route Frequency Ordered Stop   11/15/21 2100  cefTRIAXone (ROCEPHIN) 2 g in sodium chloride 0.9 % 100 mL IVPB        2 g 200 mL/hr over 30 Minutes Intravenous Every 24 hours 11/15/21 2046 11/22/21 2059         Subjective: [name] today has no fevers, no emesis,  No chest pain,   Oral intake is fair-0 -States rash is not itchy -    Objective: Vitals:   11/16/21 1130 11/16/21 1148 11/16/21 1216 11/16/21 1400  BP: (!) 99/42 104/85 93/70 (!) 113/53  Pulse: 73 76 83 77  Resp:  18 18 16   Temp:  98.2 F (36.8 C)  97.7 F (36.5 C)  TempSrc:  Oral  Oral  SpO2: 91% 96% 95%  98%  Weight:   97.3 kg   Height:   6' (1.829 m)     Intake/Output Summary (Last 24 hours) at 11/16/2021 1833 Last data filed at 11/16/2021 1530 Gross per 24 hour  Intake 1102.54 ml  Output 1500 ml  Net -397.46 ml   Filed Weights   11/16/21 1216  Weight: 97.3 kg    Physical Exam  Gen:- Awake Alert, no acute distress HEENT:- .AT, No sclera icterus Neck-Supple Neck,No JVD,.  Lungs-  CTAB , fair symmetrical air movement CV- S1, S2 normal, regular  Abd-  +ve B.Sounds, Abd Soft, No tenderness,    Extremity- No  edema, pedal pulses present  Psych-affect is appropriate, oriented x3 Neuro-no new focal deficits, no tremors Skin-erythematous macular rash with blisters, see photos in epic  Data Reviewed: I have personally reviewed following labs and imaging studies  CBC: Recent Labs  Lab 11/15/21 1838 11/16/21 0603  WBC 9.9 7.3  HGB 11.2* 10.5*  HCT 35.2* 32.5*  MCV 90.5 90.8   PLT 246 207   Basic Metabolic Panel: Recent Labs  Lab 11/15/21 1838 11/16/21 0603  NA 136 139  K 5.1 4.2  CL 101 104  CO2 26 31  GLUCOSE 328* 261*  BUN 39* 32*  CREATININE 2.43* 1.81*  CALCIUM 9.4 9.0  MG  --  1.6*  PHOS  --  3.7   GFR: Estimated Creatinine Clearance: 43.3 mL/min (A) (by C-G formula based on SCr of 1.81 mg/dL (H)). Liver Function Tests: Recent Labs  Lab 11/15/21 1930 11/16/21 0603  AST 14* 40  ALT 17 27  ALKPHOS 54 40  BILITOT 0.5 0.5  PROT 7.4 6.5  ALBUMIN 3.5 3.0*   Cardiac Enzymes: No results for input(s): "CKTOTAL", "CKMB", "CKMBINDEX", "TROPONINI" in the last 168 hours. BNP (last 3 results) No results for input(s): "PROBNP" in the last 8760 hours. HbA1C: No results for input(s): "HGBA1C" in the last 72 hours. Sepsis Labs: @LABRCNTIP (procalcitonin:4,lacticidven:4) ) Recent Results (from the past 240 hour(s))  Blood Culture (routine x 2)     Status: None (Preliminary result)   Collection Time: 11/15/21  8:45 PM   Specimen: BLOOD RIGHT HAND  Result Value Ref Range Status   Specimen Description BLOOD RIGHT HAND  Final   Special Requests   Final    BOTTLES DRAWN AEROBIC AND ANAEROBIC Blood Culture adequate volume   Culture   Final    NO GROWTH < 12 HOURS Performed at [facility], [address] [place], [place]    Report Status PENDING  Incomplete  Resp Panel by RT-PCR (Flu A&B, Covid) Anterior Nasal Swab     Status: None   Collection Time: 11/15/21  8:47 PM   Specimen: Anterior Nasal Swab  Result Value Ref Range Status   SARS Coronavirus 2 by RT PCR NEGATIVE NEGATIVE Final    Comment: (NOTE) SARS-CoV-2 target nucleic acids are NOT DETECTED.  The SARS-CoV-2 RNA is generally detectable in upper respiratory specimens during the acute phase of infection. The lowest concentration of SARS-CoV-2 viral copies this assay can detect is 138 copies/mL. A negative result does not preclude SARS-Cov-2 infection and should not be used  as the sole basis for treatment or other patient management decisions. A negative result may occur with  improper specimen collection/handling, submission of specimen other than nasopharyngeal swab, presence of viral mutation(s) within the areas targeted by this assay, and inadequate number of viral copies(<138 copies/mL). A negative result must be combined with clinical observations, patient history, and  epidemiological information. The expected result is Negative.  Fact Sheet for Patients:  BloggerCourse.com  Fact Sheet for Healthcare Providers:  SeriousBroker.it  This test is no t yet approved or cleared by the [place] FDA and  has been authorized for detection and/or diagnosis of SARS-CoV-2 by FDA under an Emergency Use Authorization (EUA). This EUA will remain  in effect (meaning this test can be used) for the duration of the COVID-19 declaration under Section 564(b)(1) of the Act, 21 U.S.C.section 360bbb-3(b)(1), unless the authorization is terminated  or revoked sooner.       Influenza A by PCR NEGATIVE NEGATIVE Final   Influenza B by PCR NEGATIVE NEGATIVE Final    Comment: (NOTE) The Xpert Xpress SARS-CoV-2/FLU/RSV plus assay is intended as an aid in the diagnosis of influenza from Nasopharyngeal swab specimens and should not be used as a sole basis for treatment. Nasal washings and aspirates are unacceptable for Xpert Xpress SARS-CoV-2/FLU/RSV testing.  Fact Sheet for Patients: BloggerCourse.com  Fact Sheet for Healthcare Providers: SeriousBroker.it  This test is not yet approved or cleared by the [place] FDA and has been authorized for detection and/or diagnosis of SARS-CoV-2 by FDA under an Emergency Use Authorization (EUA). This EUA will remain in effect (meaning this test can be used) for the duration of the COVID-19 declaration under Section 564(b)(1)  of the Act, 21 U.S.C. section 360bbb-3(b)(1), unless the authorization is terminated or revoked.  Performed at [facility], [address] [place], [place] [address]   Blood Culture (routine x 2)     Status: None (Preliminary result)   Collection Time: 11/15/21  8:50 PM   Specimen: Left Antecubital; Blood  Result Value Ref Range Status   Specimen Description LEFT ANTECUBITAL  Final   Special Requests   Final    BOTTLES DRAWN AEROBIC AND ANAEROBIC Blood Culture adequate volume   Culture   Final    NO GROWTH < 12 HOURS Performed at [facility], [address], [place], [place] [address]    Report Status PENDING  Incomplete      Radiology Studies: DG Chest Port 1 View  Result Date: 11/15/2021 CLINICAL DATA:  Dysuria possible sepsis EXAM: PORTABLE CHEST 1 VIEW COMPARISON:  09/17/2021 FINDINGS: The heart size and mediastinal contours are within normal limits. Both lungs are clear. The visualized skeletal structures are unremarkable. IMPRESSION: No active disease. Electronically Signed   By: Jasmine Pang M.D.   On: 11/15/2021 21:36     Scheduled Meds:  alfuzosin  10 mg Oral Q breakfast   atorvastatin  20 mg Oral Daily   citalopram  20 mg Oral QHS   finasteride  5 mg Oral Daily   heparin  5,000 Units Subcutaneous Q8H   insulin aspart  0-5 Units Subcutaneous QHS   insulin aspart  0-9 Units Subcutaneous TID WC   insulin detemir  15 Units Subcutaneous QHS   Ipratropium-Albuterol  1 puff Inhalation BID   pantoprazole  40 mg Oral Daily   risperiDONE  0.5 mg Oral QHS   Continuous Infusions:  cefTRIAXone (ROCEPHIN)  IV Stopped (11/15/21 2138)     LOS: 1 day    Shon Hale M.D on 11/16/2021 at 6:33 PM  Go to [email or web address] - for contact info  [facility] - Office  [phone]  If 7PM-7AM, please contact night-coverage [email or web address] 11/16/2021, 6:33 PM

## 2021-11-16 NOTE — Telephone Encounter (Signed)
I called Douglas Cook with [facility] to give medication order.  After speaking with Home Health Nurse yesterday, patient started to decline more and was sent to ER where he is admitted currently per home health nurse.  Message sent to MD

## 2021-11-16 NOTE — Inpatient Diabetes Management (Signed)
Inpatient Diabetes Program Recommendations  AACE/ADA: New Consensus Statement on Inpatient Glycemic Control (2015)  Target Ranges:  Prepandial:   less than 140 mg/dL      Peak postprandial:   less than 180 mg/dL (1-2 hours)      Critically ill patients:  140 - 180 mg/dL   Lab Results  Component Value Date   GLUCAP 298 (H) 11/16/2021   HGBA1C 7.0 (H) 06/19/2021    Review of Glycemic Control  Latest Reference Range & Units 11/16/21 07:40 11/16/21 11:54  Glucose-Capillary 70 - 99 mg/dL 920 (H) 100 (H)  (H): Data is abnormally high  Diabetes history: DM2 Outpatient Diabetes medications: Levemir 50 units qd, Novolog 12 units tid, Tradjenta 5 mg qd Current orders for Inpatient glycemic control: Levemir 15 units qhs, Novolog 0-9 units tid, 0-5 units hs  Inpatient Diabetes Program Recommendations:   Please consider: -Add Novolog 4 units tid meal coverage if eats 50%  Thank you, Darel Hong E. Nadja Lina, RN, MSN, CDE  Diabetes Coordinator Inpatient Glycemic Control Team Team Pager [phone] (8am-5pm) 11/16/2021 2:42 PM

## 2021-11-16 NOTE — TOC Initial Note (Signed)
Transition of Care [facility]) - Initial/Assessment Note    Patient Details  Name: Douglas Cook MRN: [id number] Date of Birth: July 07, 1945  Transition of Care [facility]) CM/SW Contact:    Annice Needy, LCSW Phone Number: 11/16/2021, 3:42 PM  Clinical Narrative:                 Patient from [facility]. He requires extensive assistance for ADLs and assistance with transfers to his wc. Discussed w/e d/c and facility request d/c medications on Friday to ensure that patient can receive meds over weekend. Attending notified of request.   Expected Discharge Plan: Assisted Living Barriers to Discharge: Continued Medical Work up   Patient Goals and CMS Choice Patient states their goals for this hospitalization and ongoing recovery are:: return to ALF      Expected Discharge Plan and Services Expected Discharge Plan: Assisted Living       Living arrangements for the past 2 months: Assisted Living Facility                                      Prior Living Arrangements/Services Living arrangements for the past 2 months: Assisted Living Facility Lives with:: Facility Resident Patient language and need for interpreter reviewed:: Yes        Need for Family Participation in Patient Care: Yes (Comment) Care giver support system in place?: Yes (comment) Current home services: DME Criminal Activity/Legal Involvement Pertinent to Current Situation/Hospitalization: No - Comment as needed  Activities of Daily Living Home Assistive Devices/Equipment: Wheelchair, Transfer belt, Shower chair with back, Grab bars in shower, Grab bars around toilet, Eyeglasses, CBG Meter ADL Screening (condition at time of admission) Patient's cognitive ability adequate to safely complete daily activities?: No Is the patient deaf or have difficulty hearing?: No Does the patient have difficulty seeing, even when wearing glasses/contacts?: No Does the patient have difficulty concentrating, remembering, or  making decisions?: Yes Patient able to express need for assistance with ADLs?: No Does the patient have difficulty dressing or bathing?: Yes Independently performs ADLs?: No Communication: Independent Dressing (OT): Needs assistance Is this a change from baseline?: Pre-admission baseline Grooming: Independent Feeding: Independent Bathing: Needs assistance Is this a change from baseline?: Pre-admission baseline Toileting: Needs assistance Is this a change from baseline?: Pre-admission baseline In/Out Bed: Needs assistance Is this a change from baseline?: Pre-admission baseline Walks in Home: Needs assistance (Can only stand and pivot) Is this a change from baseline?: Pre-admission baseline Does the patient have difficulty walking or climbing stairs?: Yes Weakness of Legs: Both Weakness of Arms/Hands: Both  Permission Sought/Granted Permission sought to share information with : Facility Medical sales representative    Share Information with NAME: Rolla Plate           Emotional Assessment         Alcohol / Substance Use: Not Applicable Psych Involvement: No (comment)  Admission diagnosis:  Sepsis secondary to UTI (HCC) [A41.9, N39.0] Sepsis, due to unspecified organism, unspecified whether acute organ dysfunction present [facility]) [A41.9] Patient Active Problem List   Diagnosis Date Noted   Uncontrolled type 2 diabetes mellitus with hyperglycemia, with long-term current use of insulin (HCC) 11/16/2021   Urinary tract infection 09/18/2021   Hyperglycemia 09/17/2021   Urinary retention 08/10/2021   History of UTI 08/10/2021   Acute metabolic encephalopathy 06/18/2021   Sepsis secondary to UTI (HCC) 05/09/2021   Major neurocognitive disorder (HCC) 02/10/2021  HTN (hypertension) 02/08/2021   BPH without obstruction/lower urinary tract symptoms 02/08/2021   Anemia due to stage 3b chronic kidney disease (HCC) 02/08/2021   CKD stage 3 due to type 2 diabetes mellitus (HCC)  02/08/2021   Chronic constipation 02/08/2021   Major depression, recurrent, chronic (HCC) 02/08/2021   UTI (urinary tract infection) 02/08/2021   Closed fracture of neck of left femur (HCC)    Hip fracture (HCC) 02/01/2021   Acute kidney injury superimposed on CKD (HCC) 07/04/2017   COPD (chronic obstructive pulmonary disease) (HCC) 07/04/2017   Controlled type 2 diabetes mellitus without complication, without long-term current use of insulin (HCC) 07/04/2017   GERD (gastroesophageal reflux disease) 07/04/2017   Mixed hyperlipidemia 07/04/2017   Depression 07/04/2017   PCP:  Ignatius Specking, MD Pharmacy:   [facility], [place] - [address] [place] [address] Phone: [phone] Fax: [phone]     Social Determinants of Health (SDOH) Interventions    Readmission Risk Interventions    09/20/2021   11:19 AM 06/20/2021   10:42 AM 06/19/2021   10:14 AM  Readmission Risk Prevention Plan  Transportation Screening Complete Complete Complete  Medication Review [facility]) Complete Complete Complete  PCP or Specialist appointment within 3-5 days of discharge Complete Complete   HRI or Home Care Consult  Complete Complete  SW Recovery Care/Counseling Consult Complete Complete Complete  Palliative Care Screening Not Applicable Not Applicable Not Applicable  Skilled Nursing Facility Not Applicable Not Applicable Not Applicable

## 2021-11-17 DIAGNOSIS — N189 Chronic kidney disease, unspecified: Secondary | ICD-10-CM | POA: Diagnosis not present

## 2021-11-17 DIAGNOSIS — L0390 Cellulitis, unspecified: Secondary | ICD-10-CM | POA: Diagnosis present

## 2021-11-17 DIAGNOSIS — N179 Acute kidney failure, unspecified: Secondary | ICD-10-CM | POA: Diagnosis not present

## 2021-11-17 LAB — GLUCOSE, CAPILLARY
Glucose-Capillary: 208 mg/dL — ABNORMAL HIGH (ref 70–99)
Glucose-Capillary: 185 mg/dL — ABNORMAL HIGH (ref 70–99)
Glucose-Capillary: 196 mg/dL — ABNORMAL HIGH (ref 70–99)
Glucose-Capillary: 265 mg/dL — ABNORMAL HIGH (ref 70–99)

## 2021-11-17 MED ORDER — CEFDINIR 300 MG PO CAPS: 300.0000 mg | ORAL_CAPSULE | Freq: Two times a day (BID) | ORAL | 0 refills | Status: AC

## 2021-11-17 MED ORDER — SENNOSIDES-DOCUSATE SODIUM 8.6-50 MG PO TABS
2.0000 | ORAL_TABLET | Freq: Every day | ORAL | 0 refills | Status: AC
Start: 2021-11-17 — End: ?

## 2021-11-17 NOTE — Plan of Care (Signed)
  Problem: Skin Integrity: Goal: Risk for impaired skin integrity will decrease Outcome: Progressing   

## 2021-11-17 NOTE — Discharge Summary (Signed)
Douglas Cook, is a 76 y.o. male  DOB 1945-06-30  MRN [id number].  Admission date:  11/15/2021  Admitting Physician  Bernadette Hoit, DO  Discharge Date:  11/18/2021   Primary MD  Glenda Chroman, MD  Recommendations for primary care physician for things to follow:   1)Complete Omnicef antibiotics as prescribed 2)Repeat CBC and BMP blood test around Tuesday, 11/21/2021  Admission Diagnosis  Sepsis secondary to UTI ([place]) [A41.9, N39.0] Sepsis, due to unspecified organism, unspecified whether acute organ dysfunction present [facility]) [A41.9]  Discharge Diagnosis  Sepsis secondary to UTI ([place]) [A41.9, N39.0] Sepsis, due to unspecified organism, unspecified whether acute organ dysfunction present ([place]) [A41.9]    Principal Problem:   Acute kidney injury superimposed on CKD ([place]) Active Problems:   Cellulitis   COPD (chronic obstructive pulmonary disease) ([place])   GERD (gastroesophageal reflux disease)   Mixed hyperlipidemia   Depression   CKD stage 3 due to type 2 diabetes mellitus ([place])   Sepsis secondary to UTI ([place])   Uncontrolled type 2 diabetes mellitus with hyperglycemia, with long-term current use of insulin ([place])     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)    Arthritis    CKD (chronic kidney disease), stage III ([place])    COPD (chronic obstructive pulmonary disease) ([place])    Depression    Diabetes mellitus without complication ([place])    Fatigue    GERD (gastroesophageal reflux disease)    Hyperlipidemia    Osteoarthritis    Reflux     Past Surgical History:  Procedure Laterality Date   HIP ARTHROPLASTY Left 02/03/2021   Procedure: ARTHROPLASTY BIPOLAR HIP (HEMIARTHROPLASTY);  Surgeon: Carole Civil, MD;  Location: [facility];  Service: Orthopedics;  Laterality: Left;     HPI  from the history and physical done on the day of  admission:   HPI: Douglas Cook is a 76 y.o. male with medical history significant of COPD, GERD, hyperlipidemia, T2DM, acute kidney injury on CKD 3B who presents emergency department via EMS due to 3-day onset of burning sensation on urination and right-sided lower back pain.  Patient was unable to provide detailed history regarding his condition other than having burning sensation on urination, further history was obtained from ED PA and ED medical record.  Per report, patient was reported to have fever and was evaluated on August 1 due to history of BPH and urinary obstruction, patient was reported to have completed a course of Bactrim at that time and was placed on fosfomycin once every 10 days to prevent UTI due to history of recurrent UTIs. He was recently admitted from June 25 to June 28 due to UTI and was treated with IV Rocephin.   ED Course:  In the emergency department, he was febrile with a temperature of 100.72F, pulse 115/min, other vital signs were within normal range.  Work-up in the ED showed normocytic anemia, hyperglycemia, BUN/creatinine 39/2.43 (baseline creatinine at 1.6-1.9).  Urinalysis was positive for  UTI and glycosuria.  Lactic acid 2.9 > 2.1.  Influenza A, B, SARS coronavirus 2 was negative.  Blood culture was pending. Chest x-ray showed no active disease Patient was treated with IV ceftriaxone, IV hydration was provided per sepsis protocol.  Hospitalist was asked to admit patient for further evaluation and management.   Review of Systems: Review of systems as noted in the HPI. All other systems reviewed and are negative.    Hospital Course:   Brief Narrative:  76 y.o. male with medical history significant of COPD, GERD, hyperlipidemia, T2DM, acute kidney injury on CKD 3B admitted on 11/15/2021 with concerns for sepsis secondary to urinary source  Assessment and Plan:   1)Sepsis secondary to Proteus Mirabilis UTI and cellulitis ---POA -Patient met sepsis criteria on  admission -Cellulitis much improved -Urine culture with Proteus Mirabilis -Sepsis pathophysiology resolving -Treated with IV Rocephin, okay to discharge on p.o. Omnicef   2) AKI on CKD 3B--- overall improved renal function with treatment of UTI and hydration -Creatinine is down to 1.47 from 2.43 on admission  -baseline usually between 1.5 and 1.7   3)Rash--- please see photos in epic, -States rash is not itchy -Rash is much improved, suspect it is related to infection/sepsis as above #1   4)COPD--no acute exacerbation, continue bronchodilators as needed   5)DM2- -ok to resume metformin and insulin regimen, monitor renal function while on metformin    Disposition: The patient is from: [facility]              Anticipated d/c is to: ALF Code Status : - -  Code Status: Full Code   Discharge Condition: stable  Diet and Activity recommendation:  As advised  Discharge Instructions    Discharge Instructions     Call MD for:  difficulty breathing, headache or visual disturbances   Complete by: As directed    Call MD for:  persistant dizziness or light-headedness   Complete by: As directed    Call MD for:  persistant nausea and vomiting   Complete by: As directed    Call MD for:  temperature >100.4   Complete by: As directed    Diet - low sodium heart healthy   Complete by: As directed    Diet Carb Modified   Complete by: As directed    Discharge instructions   Complete by: As directed    1)Complete Omnicef antibiotics as prescribed 2)Repeat CBC and BMP blood test around Tuesday, 11/21/2021   Increase activity slowly   Complete by: As directed        Discharge Medications     Allergies as of 11/18/2021   No Known Allergies      Medication List     STOP taking these medications    fosfomycin 3 g Pack Commonly known as: MONUROL       TAKE these medications    acetaminophen 325 MG tablet Commonly known as: TYLENOL Take 2 tablets (650 mg total) by mouth  every 6 (six) hours as needed for mild pain (or Fever >/= 101).   albuterol 108 (90 Base) MCG/ACT inhaler Commonly known as: VENTOLIN HFA Inhale 1-2 puffs into the lungs every 4 (four) hours as needed for wheezing or shortness of breath.   alfuzosin 10 MG 24 hr tablet Commonly known as: UROXATRAL Take 1 tablet (10 mg total) by mouth daily with breakfast.   Alpha-Lipoic Acid 100 MG Caps Take 300 mg by mouth daily.   aspirin EC 81 MG tablet Take  1 tablet (81 mg total) by mouth daily. Swallow whole.   atorvastatin 20 MG tablet Commonly known as: LIPITOR Take 1 tablet (20 mg total) by mouth daily.   cefdinir 300 MG capsule Commonly known as: OMNICEF Take 1 capsule (300 mg total) by mouth 2 (two) times daily for 5 days.   cetirizine 10 MG tablet Commonly known as: ZYRTEC Take 1 tablet (10 mg total) by mouth at bedtime.   cholecalciferol 25 MCG (1000 UNIT) tablet Commonly known as: VITAMIN D3 Take 1 tablet (1,000 Units total) by mouth daily.   citalopram 20 MG tablet Commonly known as: CELEXA Take 1 tablet (20 mg total) by mouth at bedtime.   Combivent Respimat 20-100 MCG/ACT Aers respimat Generic drug: Ipratropium-Albuterol Inhale 2 puffs into the lungs 2 (two) times daily.   FeroSul 325 (65 FE) MG tablet Generic drug: ferrous sulfate Take 1 tablet (325 mg total) by mouth 2 (two) times daily.   finasteride 5 MG tablet Commonly known as: PROSCAR Take 5 mg by mouth daily.   fluticasone 50 MCG/ACT nasal spray Commonly known as: FLONASE Place 1 spray into both nostrils daily.   lamoTRIgine 100 MG tablet Commonly known as: LAMICTAL Take 1 tablet (100 mg total) by mouth daily.   Levemir FlexTouch 100 UNIT/ML FlexPen Generic drug: insulin detemir Inject 30 Units into the skin at bedtime. What changed:  how much to take additional instructions   metFORMIN 500 MG 24 hr tablet Commonly known as: GLUCOPHAGE-XR Take 2 tablets (1,000 mg total) by mouth daily with  breakfast.   NovoLOG FlexPen 100 UNIT/ML FlexPen Generic drug: insulin aspart Inject 12 Units into the skin 3 (three) times daily with meals.   nystatin cream Commonly known as: MYCOSTATIN Apply 1 Application topically 2 (two) times daily.   omeprazole 20 MG capsule Commonly known as: PRILOSEC Take 1 capsule (20 mg total) by mouth daily.   risperiDONE 0.5 MG tablet Commonly known as: RISPERDAL Take 1 tablet (0.5 mg total) by mouth at bedtime.   senna-docusate 8.6-50 MG tablet Commonly known as: Senokot-S Take 2 tablets by mouth at bedtime. What changed:  how much to take when to take this reasons to take this   Systane Balance 0.6 % Soln Generic drug: Propylene Glycol Apply 1 drop to eye 2 (two) times daily. Both eyes   Tradjenta 5 MG Tabs tablet Generic drug: linagliptin Take 1 tablet (5 mg total) by mouth daily.   traZODone 100 MG tablet Commonly known as: DESYREL Take 1 tablet (100 mg total) by mouth at bedtime.        Major procedures and Radiology Reports - PLEASE review detailed and final reports for all details, in brief -   DG Chest Port 1 View  Result Date: 11/15/2021 CLINICAL DATA:  Dysuria possible sepsis EXAM: PORTABLE CHEST 1 VIEW COMPARISON:  09/17/2021 FINDINGS: The heart size and mediastinal contours are within normal limits. Both lungs are clear. The visualized skeletal structures are unremarkable. IMPRESSION: No active disease. Electronically Signed   By: Donavan Foil M.D.   On: 11/15/2021 21:36   CT RENAL STONE STUDY  Result Date: 11/06/2021 CLINICAL DATA:  Microscopic hematuria. EXAM: CT ABDOMEN AND PELVIS WITHOUT CONTRAST TECHNIQUE: Multidetector CT imaging of the abdomen and pelvis was performed following the standard protocol without IV contrast. RADIATION DOSE REDUCTION: This exam was performed according to the departmental dose-optimization program which includes automated exposure control, adjustment of the mA and/or kV according to patient  size and/or use of iterative reconstruction  technique. COMPARISON:  None Available. FINDINGS: Lower chest: No acute abnormality. Hepatobiliary: There is no suspicious liver abnormality. Status post cholecystectomy. Pneumobilia identified which may be the sequelae of prior sphincterotomy. Pancreas: Unremarkable. No pancreatic ductal dilatation or surrounding inflammatory changes. Spleen: Normal in size without focal abnormality. Adrenals/Urinary Tract: Normal adrenal glands. No nephrolithiasis, hydronephrosis or kidney mass. No hydroureter or ureteral lithiasis. Urinary bladder is unremarkable. Stomach/Bowel: Small hiatal hernia. Stomach is otherwise within normal limits. The appendix is visualized and appears normal. No bowel wall thickening, inflammation, or distension. Moderate retained stool identified within the colon and rectum. Vascular/Lymphatic: Aortic atherosclerosis. No signs of abdominopelvic adenopathy. Reproductive: Prostate is unremarkable. Other: No free fluid or fluid collections. No signs of pneumoperitoneum. Musculoskeletal: Status post left hip arthroplasty. No acute or suspicious osseous findings. IMPRESSION: 1. No acute findings within the abdomen or pelvis. 2. No nephrolithiasis or hydronephrosis. 3. Moderate retained stool within the colon and rectum. 4. Aortic Atherosclerosis (ICD10-I70.0). Electronically Signed   By: Kerby Moors M.D.   On: 11/06/2021 19:20    Micro Results   Recent Results (from the past 240 hour(s))  Blood Culture (routine x 2)     Status: None (Preliminary result)   Collection Time: 11/15/21  8:45 PM   Specimen: BLOOD RIGHT HAND  Result Value Ref Range Status   Specimen Description BLOOD RIGHT HAND  Final   Special Requests   Final    BOTTLES DRAWN AEROBIC AND ANAEROBIC Blood Culture adequate volume   Culture   Final    NO GROWTH 2 DAYS Performed at [facility], [address] [place], [place] [address]    Report Status PENDING  Incomplete  Urine  Culture     Status: Abnormal (Preliminary result)   Collection Time: 11/15/21  8:45 PM   Specimen: In/Out Cath Urine  Result Value Ref Range Status   Specimen Description   Final    IN/OUT CATH URINE Performed at [facility], [address], [place], [place] [address]    Special Requests   Final    NONE Performed at [facility], [address], [place], [place] [address]    Culture (A)  Final    >=100,000 COLONIES/mL LACTOBACILLUS SPECIES 40,000 COLONIES/mL PROTEUS MIRABILIS SUSCEPTIBILITIES TO FOLLOW Performed at [facility], [place] [address] [place],  [address]    Report Status PENDING  Incomplete  Resp Panel by RT-PCR (Flu A&B, Covid) Anterior Nasal Swab     Status: None   Collection Time: 11/15/21  8:47 PM   Specimen: Anterior Nasal Swab  Result Value Ref Range Status   SARS Coronavirus 2 by RT PCR NEGATIVE NEGATIVE Final    Comment: (NOTE) SARS-CoV-2 target nucleic acids are NOT DETECTED.  The SARS-CoV-2 RNA is generally detectable in upper respiratory specimens during the acute phase of infection. The lowest concentration of SARS-CoV-2 viral copies this assay can detect is 138 copies/mL. A negative result does not preclude SARS-Cov-2 infection and should not be used as the sole basis for treatment or other patient management decisions. A negative result may occur with  improper specimen collection/handling, submission of specimen other than nasopharyngeal swab, presence of viral mutation(s) within the areas targeted by this assay, and inadequate number of viral copies(<138 copies/mL). A negative result must be combined with clinical observations, patient history, and epidemiological information. The expected result is Negative.  Fact Sheet for Patients:  EntrepreneurPulse.com.au  Fact Sheet for Healthcare Providers:  IncredibleEmployment.be  This test is no t yet approved or  cleared by the [place] and  has  been authorized for detection and/or diagnosis of SARS-CoV-2 by FDA under an Emergency Use Authorization (EUA). This EUA will remain  in effect (meaning this test can be used) for the duration of the COVID-19 declaration under Section 564(b)(1) of the Act, 21 U.S.C.section 360bbb-3(b)(1), unless the authorization is terminated  or revoked sooner.       Influenza A by PCR NEGATIVE NEGATIVE Final   Influenza B by PCR NEGATIVE NEGATIVE Final    Comment: (NOTE) The Xpert Xpress SARS-CoV-2/FLU/RSV plus assay is intended as an aid in the diagnosis of influenza from Nasopharyngeal swab specimens and should not be used as a sole basis for treatment. Nasal washings and aspirates are unacceptable for Xpert Xpress SARS-CoV-2/FLU/RSV testing.  Fact Sheet for Patients: EntrepreneurPulse.com.au  Fact Sheet for Healthcare Providers: IncredibleEmployment.be  This test is not yet approved or cleared by the [place] FDA and has been authorized for detection and/or diagnosis of SARS-CoV-2 by FDA under an Emergency Use Authorization (EUA). This EUA will remain in effect (meaning this test can be used) for the duration of the COVID-19 declaration under Section 564(b)(1) of the Act, 21 U.S.C. section 360bbb-3(b)(1), unless the authorization is terminated or revoked.  Performed at [facility], [address] [place], [place] [address]   Blood Culture (routine x 2)     Status: None (Preliminary result)   Collection Time: 11/15/21  8:50 PM   Specimen: Left Antecubital; Blood  Result Value Ref Range Status   Specimen Description LEFT ANTECUBITAL  Final   Special Requests   Final    BOTTLES DRAWN AEROBIC AND ANAEROBIC Blood Culture adequate volume   Culture   Final    NO GROWTH 2 DAYS Performed at [facility], [address] [place], [place] [address]    Report Status PENDING  Incomplete   Today   Subjective    Kymoni Monday today has no new  complaints  -No fevers no chills -Eating and drinking well -Voiding well without dysuria          Patient has been seen and examined prior to discharge   Objective   Blood pressure (!) 100/57, pulse 89, temperature 99.3 F (37.4 C), temperature source Oral, resp. rate 16, height 6' (1.829 m), weight 97.3 kg, SpO2 94 %.   Intake/Output Summary (Last 24 hours) at 11/18/2021 1450 Last data filed at 11/18/2021 1100 Gross per 24 hour  Intake 820 ml  Output 450 ml  Net 370 ml   Exam Gen:- Awake Alert, no acute distress  HEENT:- [place].AT, No sclera icterus Neck-Supple Neck,No JVD,.  Lungs-  CTAB , good air movement bilaterally CV- S1, S2 normal, regular Abd-  +ve B.Sounds, Abd Soft, No tenderness, no CVA area tenderness Extremity- No  edema,   good pulses Psych-affect is appropriate, oriented x3 Neuro-no new focal deficits, no tremors  -Skin-erythematous macular rash with blisters, see comparative photos from 11/16/2021 compared to photos from 11/18/21   Media Information   Document Information  Photos  Much Improved Rash  11/18/2021 10:01  Attached To:  Hospital Encounter on 11/15/21   Source Information  Roxan Hockey, MD  Ap[facility]Dept 300     Data Review   CBC w Diff:  Lab Results  Component Value Date   WBC 6.8 11/18/2021   HGB 10.5 (L) 11/18/2021   HCT 31.9 (L) 11/18/2021   PLT 231 11/18/2021   LYMPHOPCT 16 10/19/2021   MONOPCT 9 10/19/2021   EOSPCT  11 10/19/2021   BASOPCT 1 10/19/2021    CMP:  Lab Results  Component Value Date   NA 138 11/18/2021   K 4.1 11/18/2021   CL 101 11/18/2021   CO2 29 11/18/2021   BUN 22 11/18/2021   CREATININE 1.47 (H) 11/18/2021   PROT 6.5 11/16/2021   ALBUMIN 3.0 (L) 11/16/2021   BILITOT 0.5 11/16/2021   ALKPHOS 40 11/16/2021   AST 40 11/16/2021   ALT 27 11/16/2021   Total Discharge time is about 33 minutes  Roxan Hockey M.D on 11/18/2021 at 2:50 PM  Go to [email or web address] -  for contact info  [facility] -  Office  [phone]

## 2021-11-17 NOTE — Progress Notes (Addendum)
PROGRESS NOTE     Douglas Cook, is a 76 y.o. male, DOB - [birth year], EML:[id number]  Admit date - 11/15/2021   Admitting Physician Bernadette Hoit, DO  Outpatient Primary MD for the patient is Glenda Chroman, MD  LOS - 2  Chief Complaint  Patient presents with   Dysuria        Brief Narrative:  76 y.o. male with medical history significant of COPD, GERD, hyperlipidemia, T2DM, acute kidney injury on CKD 3B admitted on 11/15/2021 with concerns for sepsis secondary to urinary source   -Assessment and Plan:  1) sepsis secondary to cellulitis and possible UTI----POA -Patient met sepsis criteria on admission -cellulitis appears to be improving  -Urine culture NGTD continue IV Rocephin pending further culture data  2) AKI on CKD 3B--- anticipate improvement with treatment of UTI and hydration  3)Rash--- please see photos in epic, -States rash is not itchy -??  If related to infection/sepsis as above #1  4)COPD--no acute exacerbation, continue bronchodilators as needed  5)DM2- .continue scheduled insulin and use Novolog/Humalog Sliding scale insulin with Accu-Cheks/Fingersticks as ordered   Disposition/Need for in-Hospital Stay- patient unable to be discharged at this time due to sepsis secondary to cellulitis and presumed UTI requiring IV antibiotics and IV fluids* -Possible discharge back to ALF on 11/18/2021 on oral antibiotics if continues to improve especially if cultures remain negative  Status is: Inpatient   Disposition: The patient is from: [facility]              Anticipated d/c is to: ALF              Anticipated d/c date is: 2 days              Patient currently is not medically stable to d/c. Barriers: Not Clinically Stable- =  Code Status : - -  Code Status: Full Code   Family Communication:   -(patient is alert, awake and coherent)   DVT Prophylaxis  :   - SCDs  heparin injection 5,000 Units Start: 11/16/21 0600 SCDs Start: 11/16/21 0233   Lab Results   Component Value Date   PLT 207 11/16/2021    Inpatient Medications  Scheduled Meds:  alfuzosin  10 mg Oral Q breakfast   atorvastatin  20 mg Oral Daily   citalopram  20 mg Oral QHS   finasteride  5 mg Oral Daily   heparin  5,000 Units Subcutaneous Q8H   insulin aspart  0-5 Units Subcutaneous QHS   insulin aspart  0-9 Units Subcutaneous TID WC   insulin detemir  15 Units Subcutaneous QHS   Ipratropium-Albuterol  1 puff Inhalation BID   pantoprazole  40 mg Oral Daily   risperiDONE  0.5 mg Oral QHS   Continuous Infusions:  cefTRIAXone (ROCEPHIN)  IV 2 g (11/16/21 2224)   PRN Meds:.albuterol   Anti-infectives (From admission, onward)    Start     Dose/Rate Route Frequency Ordered Stop   11/15/21 2100  cefTRIAXone (ROCEPHIN) 2 g in sodium chloride 0.9 % 100 mL IVPB        2 g 200 mL/hr over 30 Minutes Intravenous Every 24 hours 11/15/21 2046 11/22/21 2059      Subjective: Douglas Cook today has no fevers, no emesis,  No chest pain,   -Denies leg pains -No productive cough -Voiding okay, no dysuria, no back pain  Objective: Vitals:   11/16/21 2103 11/16/21 2124 11/17/21 0602 11/17/21 0917  BP:  116/66 (!) 101/44  Pulse:  89 75   Resp:  20 18   Temp:  99.6 F (37.6 C) 98.4 F (36.9 C)   TempSrc:   Oral   SpO2: 95% 97% 98% 94%  Weight:      Height:        Intake/Output Summary (Last 24 hours) at 11/17/2021 1124 Last data filed at 11/17/2021 0900 Gross per 24 hour  Intake 100 ml  Output 2050 ml  Net -1950 ml   Filed Weights   11/16/21 1216  Weight: 97.3 kg    Physical Exam  Gen:- Awake Alert, no acute distress HEENT:- [place].AT, No sclera icterus Neck-Supple Neck,No JVD,.  Lungs-  CTAB , fair symmetrical air movement CV- S1, S2 normal, regular  Abd-  +ve B.Sounds, Abd Soft, No tenderness,    Extremity- No  edema, pedal pulses present  Psych-affect is appropriate, oriented x3 Neuro-no new focal deficits, no tremors Skin-erythematous macular rash with  blisters, see photos in epic -  Media Information   Document Information  Photos  Rash with Blisters   11/16/2021 08:56  Attached To:  Hospital Encounter on 11/15/21   Source Information  Roxan Hockey, MD  [facility]    Data Reviewed: I have personally reviewed following labs and imaging studies  CBC: Recent Labs  Lab 11/15/21 1838 11/16/21 0603  WBC 9.9 7.3  HGB 11.2* 10.5*  HCT 35.2* 32.5*  MCV 90.5 90.8  PLT 246 048   Basic Metabolic Panel: Recent Labs  Lab 11/15/21 1838 11/16/21 0603  NA 136 139  K 5.1 4.2  CL 101 104  CO2 26 31  GLUCOSE 328* 261*  BUN 39* 32*  CREATININE 2.43* 1.81*  CALCIUM 9.4 9.0  MG  --  1.6*  PHOS  --  3.7   GFR: Estimated Creatinine Clearance: 43.3 mL/min (A) (by C-G formula based on SCr of 1.81 mg/dL (H)). Liver Function Tests: Recent Labs  Lab 11/15/21 1930 11/16/21 0603  AST 14* 40  ALT 17 27  ALKPHOS 54 40  BILITOT 0.5 0.5  PROT 7.4 6.5  ALBUMIN 3.5 3.0*   Recent Results (from the past 240 hour(s))  Blood Culture (routine x 2)     Status: None (Preliminary result)   Collection Time: 11/15/21  8:45 PM   Specimen: BLOOD RIGHT HAND  Result Value Ref Range Status   Specimen Description BLOOD RIGHT HAND  Final   Special Requests   Final    BOTTLES DRAWN AEROBIC AND ANAEROBIC Blood Culture adequate volume   Culture   Final    NO GROWTH 2 DAYS Performed at [facility], [address], [place], [place] [address]    Report Status PENDING  Incomplete  Urine Culture     Status: None (Preliminary result)   Collection Time: 11/15/21  8:45 PM   Specimen: In/Out Cath Urine  Result Value Ref Range Status   Specimen Description   Final    IN/OUT CATH URINE Performed at [facility], [address], [place], [place] [address]    Special Requests   Final    NONE Performed at [facility], [address], [place], [place] [address]    Culture   Final    CULTURE REINCUBATED FOR BETTER GROWTH Performed at [facility], [place] [address], [place], [place] [address]    Report Status PENDING  Incomplete  Resp Panel by RT-PCR (Flu A&B, Covid) Anterior Nasal Swab     Status: None   Collection Time: 11/15/21  8:47 PM   Specimen: Anterior Nasal Swab  Result Value Ref Range Status   SARS Coronavirus 2 by RT PCR NEGATIVE NEGATIVE Final    Comment: (NOTE) SARS-CoV-2 target nucleic acids are NOT DETECTED.  The SARS-CoV-2 RNA is generally detectable in upper respiratory specimens during the acute phase of infection. The lowest concentration of SARS-CoV-2 viral copies this assay can detect is 138 copies/mL. A negative result does not preclude SARS-Cov-2 infection and should not be used as the sole basis for treatment or other patient management decisions. A negative result may occur with  improper specimen collection/handling, submission of specimen other than nasopharyngeal swab, presence of viral mutation(s) within the areas targeted by this assay, and inadequate number of viral copies(<138 copies/mL). A negative result must be combined with clinical observations, patient history, and epidemiological information. The expected result is Negative.  Fact Sheet for Patients:  EntrepreneurPulse.com.au  Fact Sheet for Healthcare Providers:  IncredibleEmployment.be  This test is no t yet approved or cleared by the [place] FDA and  has been authorized for detection and/or diagnosis of SARS-CoV-2 by FDA under an Emergency Use Authorization (EUA). This EUA will remain  in effect (meaning this test can be used) for the duration of the COVID-19 declaration under Section 564(b)(1) of the Act, 21 U.S.C.section 360bbb-3(b)(1), unless the authorization is terminated  or revoked sooner.       Influenza A by PCR NEGATIVE NEGATIVE Final   Influenza B by PCR NEGATIVE NEGATIVE Final    Comment: (NOTE) The Xpert Xpress SARS-CoV-2/FLU/RSV plus assay is intended as an  aid in the diagnosis of influenza from Nasopharyngeal swab specimens and should not be used as a sole basis for treatment. Nasal washings and aspirates are unacceptable for Xpert Xpress SARS-CoV-2/FLU/RSV testing.  Fact Sheet for Patients: EntrepreneurPulse.com.au  Fact Sheet for Healthcare Providers: IncredibleEmployment.be  This test is not yet approved or cleared by the [place] FDA and has been authorized for detection and/or diagnosis of SARS-CoV-2 by FDA under an Emergency Use Authorization (EUA). This EUA will remain in effect (meaning this test can be used) for the duration of the COVID-19 declaration under Section 564(b)(1) of the Act, 21 U.S.C. section 360bbb-3(b)(1), unless the authorization is terminated or revoked.  Performed at [facility], [address], [place], [place] [address]   Blood Culture (routine x 2)     Status: None (Preliminary result)   Collection Time: 11/15/21  8:50 PM   Specimen: Left Antecubital; Blood  Result Value Ref Range Status   Specimen Description LEFT ANTECUBITAL  Final   Special Requests   Final    BOTTLES DRAWN AEROBIC AND ANAEROBIC Blood Culture adequate volume   Culture   Final    NO GROWTH 2 DAYS Performed at [facility], [address] [place], [place] [address]    Report Status PENDING  Incomplete      Radiology Studies: DG Chest Port 1 View  Result Date: 11/15/2021 CLINICAL DATA:  Dysuria possible sepsis EXAM: PORTABLE CHEST 1 VIEW COMPARISON:  09/17/2021 FINDINGS: The heart size and mediastinal contours are within normal limits. Both lungs are clear. The visualized skeletal structures are unremarkable. IMPRESSION: No active disease. Electronically Signed   By: Donavan Foil M.D.   On: 11/15/2021 21:36     Scheduled Meds:  alfuzosin  10 mg Oral Q breakfast   atorvastatin  20 mg Oral Daily   citalopram  20 mg Oral QHS   finasteride  5 mg Oral Daily   heparin  5,000 Units  Subcutaneous Q8H   insulin aspart  0-5 Units Subcutaneous QHS   insulin aspart  0-9 Units Subcutaneous TID WC   insulin detemir  15 Units Subcutaneous QHS   Ipratropium-Albuterol  1 puff Inhalation BID   pantoprazole  40 mg Oral Daily   risperiDONE  0.5 mg Oral QHS   Continuous Infusions:  cefTRIAXone (ROCEPHIN)  IV 2 g (11/16/21 2224)     LOS: 2 days   Roxan Hockey M.D on 11/17/2021 at 11:24 AM  Go to [email or web address] - for contact info  [facility] - Office  [phone]  If 7PM-7AM, please contact night-coverage [email or web address] 11/17/2021, 11:24 AM

## 2021-11-17 NOTE — Care Management Important Message (Signed)
Important Message  Patient Details  Name: Douglas Cook MRN: [id number] Date of Birth: 04/23/1945   Medicare Important Message Given:  Yes     Corey Harold 11/17/2021, 10:42 AM

## 2021-11-17 NOTE — Progress Notes (Signed)
Patient took night shift medications with out any issue, slept the whole night without any complaints, patient only awaking when this nurse enters the room to check on the patient.

## 2021-11-17 NOTE — NC FL2 (Signed)
[place] LEVEL OF CARE SCREENING TOOL     IDENTIFICATION  Patient Name: Douglas Cook Birthdate: 1946/03/04 Sex: male Admission Date (Current Location): 11/15/2021  [place] and [place] Number:  [id number] [place] and Address:         Provider Number: [id number]  Attending Physician Name and Address:  Roxan Hockey, MD  Relative Name and Phone Number:  Knox Saliva (Sister)   [phone]    Current Level of Care: Hospital Recommended Level of Care: [place] Prior Approval Number:    Date Approved/Denied:   PASRR Number:    Discharge Plan: Domiciliary (Rest home) ([facility])    Current Diagnoses: Patient Active Problem List   Diagnosis Date Noted   Cellulitis 11/17/2021   Uncontrolled type 2 diabetes mellitus with hyperglycemia, with long-term current use of insulin ([place]) 11/16/2021   Urinary tract infection 09/18/2021   Hyperglycemia 09/17/2021   Urinary retention 08/10/2021   History of UTI [id number]   Acute metabolic encephalopathy [id number]   Sepsis secondary to UTI ([place]) 05/09/2021   Major neurocognitive disorder ([place]) 02/10/2021   HTN (hypertension) 02/08/2021   BPH without obstruction/lower urinary tract symptoms 02/08/2021   Anemia due to stage 3b chronic kidney disease ([place]) 02/08/2021   CKD stage 3 due to type 2 diabetes mellitus ([place]) 02/08/2021   Chronic constipation 02/08/2021   Major depression, recurrent, chronic ([place]) 02/08/2021   UTI (urinary tract infection) 02/08/2021   Closed fracture of neck of left femur ([place])    Hip fracture ([place]) 02/01/2021   Acute kidney injury superimposed on CKD ([place]) 07/04/2017   COPD (chronic obstructive pulmonary disease) ([place]) 07/04/2017   Controlled type 2 diabetes mellitus without complication, without long-term current use of insulin ([place]) 07/04/2017   GERD (gastroesophageal reflux disease) 07/04/2017   Mixed hyperlipidemia 07/04/2017   Depression  07/04/2017    Orientation RESPIRATION BLADDER Height & Weight     Self, Place  Normal Continent Weight: 214 lb 8.1 oz (97.3 kg) Height:  6' (182.9 cm)  BEHAVIORAL SYMPTOMS/MOOD NEUROLOGICAL BOWEL NUTRITION STATUS      Continent Diet (heart healthy/carb modified which facility says equates to their regular, no concentrated sweets diet)  AMBULATORY STATUS COMMUNICATION OF NEEDS Skin   Extensive Assist Verbally Normal                       Personal Care Assistance Level of Assistance  Bathing, Feeding, Dressing Bathing Assistance: Maximum assistance Feeding assistance: Limited assistance Dressing Assistance: Maximum assistance     Functional Limitations Info  Sight, Hearing, Speech Sight Info: Adequate Hearing Info: Adequate Speech Info: Adequate    SPECIAL CARE FACTORS FREQUENCY                       Contractures Contractures Info: Not present    Additional Factors Info  Code Status, Allergies Code Status Info: Full Code Allergies Info: NKA           Current Medications (11/17/2021):  This is the current hospital active medication list Current Facility-Administered Medications  Medication Dose Route Frequency Provider Last Rate Last Admin   albuterol (PROVENTIL) (2.5 MG/3ML) 0.083% nebulizer solution 2.5 mg  2.5 mg Nebulization Q4H PRN Emokpae, Courage, MD       alfuzosin (UROXATRAL) 24 hr tablet 10 mg  10 mg Oral Q breakfast Adefeso, Oladapo, DO   10 mg at 11/17/21 [id number]   atorvastatin (LIPITOR) tablet 20 mg  20 mg Oral Daily Adefeso,  Oladapo, DO   20 mg at 11/17/21 5465   cefTRIAXone (ROCEPHIN) 2 g in sodium chloride 0.9 % 100 mL IVPB  2 g Intravenous Q24H Adefeso, Oladapo, DO 200 mL/hr at 11/16/21 2224 2 g at 11/16/21 2224   citalopram (CELEXA) tablet 20 mg  20 mg Oral QHS Adefeso, Oladapo, DO   20 mg at 11/16/21 2218   finasteride (PROSCAR) tablet 5 mg  5 mg Oral Daily Adefeso, Oladapo, DO   5 mg at 11/17/21 6812   heparin injection 5,000 Units  5,000  Units Subcutaneous Q8H Adefeso, Oladapo, DO   5,000 Units at 11/17/21 0603   insulin aspart (novoLOG) injection 0-5 Units  0-5 Units Subcutaneous QHS Adefeso, Oladapo, DO   2 Units at 11/16/21 2218   insulin aspart (novoLOG) injection 0-9 Units  0-9 Units Subcutaneous TID WC Adefeso, Oladapo, DO   2 Units at 11/17/21 1134   insulin detemir (LEVEMIR) injection 15 Units  15 Units Subcutaneous QHS Adefeso, Oladapo, DO   15 Units at 11/16/21 2217   Ipratropium-Albuterol (COMBIVENT) respimat 1 puff  1 puff Inhalation BID Adefeso, Oladapo, DO   1 puff at 11/17/21 0915   pantoprazole (PROTONIX) EC tablet 40 mg  40 mg Oral Daily Adefeso, Oladapo, DO   40 mg at 11/17/21 7517   risperiDONE (RISPERDAL) tablet 0.5 mg  0.5 mg Oral QHS Adefeso, Oladapo, DO   0.5 mg at 11/16/21 2218     Discharge Medications: TAKE these medications     acetaminophen 325 MG tablet Commonly known as: TYLENOL Take 2 tablets (650 mg total) by mouth every 6 (six) hours as needed for mild pain (or Fever >/= 101).    albuterol 108 (90 Base) MCG/ACT inhaler Commonly known as: VENTOLIN HFA Inhale 1-2 puffs into the lungs every 4 (four) hours as needed for wheezing or shortness of breath.    alfuzosin 10 MG 24 hr tablet Commonly known as: UROXATRAL Take 1 tablet (10 mg total) by mouth daily with breakfast.    Alpha-Lipoic Acid 100 MG Caps Take 300 mg by mouth daily.    aspirin EC 81 MG tablet Take 1 tablet (81 mg total) by mouth daily. Swallow whole.    atorvastatin 20 MG tablet Commonly known as: LIPITOR Take 1 tablet (20 mg total) by mouth daily.    cefdinir 300 MG capsule Commonly known as: OMNICEF Take 1 capsule (300 mg total) by mouth 2 (two) times daily for 5 days.    cetirizine 10 MG tablet Commonly known as: ZYRTEC Take 1 tablet (10 mg total) by mouth at bedtime.    cholecalciferol 25 MCG (1000 UNIT) tablet Commonly known as: VITAMIN D3 Take 1 tablet (1,000 Units total) by mouth daily.    citalopram 20  MG tablet Commonly known as: CELEXA Take 1 tablet (20 mg total) by mouth at bedtime.    Combivent Respimat 20-100 MCG/ACT Aers respimat Generic drug: Ipratropium-Albuterol Inhale 2 puffs into the lungs 2 (two) times daily.    FeroSul 325 (65 FE) MG tablet Generic drug: ferrous sulfate Take 1 tablet (325 mg total) by mouth 2 (two) times daily.    finasteride 5 MG tablet Commonly known as: PROSCAR Take 5 mg by mouth daily.    fluticasone 50 MCG/ACT nasal spray Commonly known as: FLONASE Place 1 spray into both nostrils daily.    lamoTRIgine 100 MG tablet Commonly known as: LAMICTAL Take 1 tablet (100 mg total) by mouth daily.    Levemir FlexTouch 100 UNIT/ML FlexPen Generic drug:  insulin detemir Inject 30 Units into the skin at bedtime. What changed:  how much to take additional instructions    metFORMIN 500 MG 24 hr tablet Commonly known as: GLUCOPHAGE-XR Take 2 tablets (1,000 mg total) by mouth daily with breakfast.    NovoLOG FlexPen 100 UNIT/ML FlexPen Generic drug: insulin aspart Inject 12 Units into the skin 3 (three) times daily with meals.    nystatin cream Commonly known as: MYCOSTATIN Apply 1 Application topically 2 (two) times daily.    omeprazole 20 MG capsule Commonly known as: PRILOSEC Take 1 capsule (20 mg total) by mouth daily.    risperiDONE 0.5 MG tablet Commonly known as: RISPERDAL Take 1 tablet (0.5 mg total) by mouth at bedtime.    senna-docusate 8.6-50 MG tablet Commonly known as: Senokot-S Take 2 tablets by mouth at bedtime. What changed:  how much to take when to take this reasons to take this    Systane Balance 0.6 % Soln Generic drug: Propylene Glycol Apply 1 drop to eye 2 (two) times daily. Both eyes    Tradjenta 5 MG Tabs tablet Generic drug: linagliptin Take 1 tablet (5 mg total) by mouth daily.    traZODone 100 MG tablet Commonly known as: DESYREL Take 1 tablet (100 mg total) by mouth at bedtime.   Relevant Imaging  Results:  Relevant Lab Results:   Additional Information    Amay Mijangos, Juleen China, LCSW

## 2021-11-17 NOTE — TOC Progression Note (Signed)
Transition of Care [facility]) - Progression Note    Patient Details  Name: Douglas Cook MRN: [id number] Date of Birth: 08/22/1945  [facility]) CM/SW Contact  Annice Needy, LCSW Phone Number: 11/17/2021, 12:16 PM  Clinical Narrative:    Discharge summary and FL2 sent to [facility] and Dondra Spry advised documentation being sent in preparation of Saturday d/c.   Expected Discharge Plan: Assisted Living Barriers to Discharge: Continued Medical Work up  Expected Discharge Plan and Services Expected Discharge Plan: Assisted Living       Living arrangements for the past 2 months: Assisted Living Facility                                       Social Determinants of Health (SDOH) Interventions    Readmission Risk Interventions    09/20/2021   11:19 AM 06/20/2021   10:42 AM 06/19/2021   10:14 AM  Readmission Risk Prevention Plan  Transportation Screening Complete Complete Complete  Medication Review [facility]) Complete Complete Complete  PCP or Specialist appointment within 3-5 days of discharge Complete Complete   HRI or Home Care Consult  Complete Complete  SW Recovery Care/Counseling Consult Complete Complete Complete  Palliative Care Screening Not Applicable Not Applicable Not Applicable  Skilled Nursing Facility Not Applicable Not Applicable Not Applicable

## 2021-11-18 DIAGNOSIS — N189 Chronic kidney disease, unspecified: Secondary | ICD-10-CM | POA: Diagnosis not present

## 2021-11-18 DIAGNOSIS — N179 Acute kidney failure, unspecified: Secondary | ICD-10-CM | POA: Diagnosis not present

## 2021-11-18 LAB — BASIC METABOLIC PANEL
Anion gap: 8 (ref 5–15)
BUN: 22 mg/dL (ref 8–23)
CO2: 29 mmol/L (ref 22–32)
Calcium: 9.2 mg/dL (ref 8.9–10.3)
Chloride: 101 mmol/L (ref 98–111)
Creatinine, Ser: 1.47 mg/dL — ABNORMAL HIGH (ref 0.61–1.24)
GFR, Estimated: 50 mL/min — ABNORMAL LOW (ref >60)
Glucose, Bld: 229 mg/dL — ABNORMAL HIGH (ref 70–99)
Potassium: 4.1 mmol/L (ref 3.5–5.1)
Sodium: 138 mmol/L (ref 135–145)

## 2021-11-18 LAB — CBC
HCT: 31.9 % — ABNORMAL LOW (ref 39.0–52.0)
Hemoglobin: 10.5 g/dL — ABNORMAL LOW (ref 13.0–17.0)
MCH: 29.5 pg (ref 26.0–34.0)
MCHC: 32.9 g/dL (ref 30.0–36.0)
MCV: 89.6 fL (ref 80.0–100.0)
Platelets: 231 10*3/uL (ref 150–400)
RBC: 3.56 MIL/uL — ABNORMAL LOW (ref 4.22–5.81)
RDW: 13.2 % (ref 11.5–15.5)
WBC: 6.8 10*3/uL (ref 4.0–10.5)
nRBC: 0 % (ref 0.0–0.2)

## 2021-11-18 LAB — GLUCOSE, CAPILLARY
Glucose-Capillary: 232 mg/dL — ABNORMAL HIGH (ref 70–99)
Glucose-Capillary: 242 mg/dL — ABNORMAL HIGH (ref 70–99)

## 2021-11-18 MED ORDER — SODIUM CHLORIDE 0.9 % IV SOLN
1.0000 g | Freq: Once | INTRAVENOUS | Status: AC
  Administered 2021-11-18: 1 g via INTRAVENOUS
  Filled 2021-11-18: qty 10

## 2021-11-18 NOTE — TOC Transition Note (Signed)
Transition of Care [facility]) - CM/SW Discharge Note   Patient Details  Name: Douglas Cook MRN: [id number] Date of Birth: [birth year]  [facility]) CM/SW Contact:  Elliot Gault, LCSW Phone Number: 11/18/2021, 9:23 AM   Clinical Narrative:     Pt medically stable for dc back to ALF today. RN has called report. TOC updated pt's sister. EMS arranged. No other TOC needs for dc.  Final next level of care: Assisted Living Barriers to Discharge: Barriers Resolved   Patient Goals and CMS Choice Patient states their goals for this hospitalization and ongoing recovery are:: return to ALF      Discharge Placement                       Discharge Plan and Services                                     Social Determinants of Health (SDOH) Interventions     Readmission Risk Interventions    09/20/2021   11:19 AM 06/20/2021   10:42 AM 06/19/2021   10:14 AM  Readmission Risk Prevention Plan  Transportation Screening Complete Complete Complete  Medication Review [facility]) Complete Complete Complete  PCP or Specialist appointment within 3-5 days of discharge Complete Complete   HRI or Home Care Consult  Complete Complete  SW Recovery Care/Counseling Consult Complete Complete Complete  Palliative Care Screening Not Applicable Not Applicable Not Applicable  Skilled Nursing Facility Not Applicable Not Applicable Not Applicable

## 2021-11-18 NOTE — Progress Notes (Signed)
Nsg Discharge Note  Admit Date:  11/15/2021 Discharge date: 11/18/2021   TEVYN CODD to be D/C'd Home per MD order.  AVS completed.   Patient/caregiver able to verbalize understanding.  Discharge Medication: Allergies as of 11/18/2021   No Known Allergies      Medication List     STOP taking these medications    fosfomycin 3 g Pack Commonly known as: MONUROL       TAKE these medications    acetaminophen 325 MG tablet Commonly known as: TYLENOL Take 2 tablets (650 mg total) by mouth every 6 (six) hours as needed for mild pain (or Fever >/= 101).   albuterol 108 (90 Base) MCG/ACT inhaler Commonly known as: VENTOLIN HFA Inhale 1-2 puffs into the lungs every 4 (four) hours as needed for wheezing or shortness of breath.   alfuzosin 10 MG 24 hr tablet Commonly known as: UROXATRAL Take 1 tablet (10 mg total) by mouth daily with breakfast.   Alpha-Lipoic Acid 100 MG Caps Take 300 mg by mouth daily.   aspirin EC 81 MG tablet Take 1 tablet (81 mg total) by mouth daily. Swallow whole.   atorvastatin 20 MG tablet Commonly known as: LIPITOR Take 1 tablet (20 mg total) by mouth daily.   cefdinir 300 MG capsule Commonly known as: OMNICEF Take 1 capsule (300 mg total) by mouth 2 (two) times daily for 5 days.   cetirizine 10 MG tablet Commonly known as: ZYRTEC Take 1 tablet (10 mg total) by mouth at bedtime.   cholecalciferol 25 MCG (1000 UNIT) tablet Commonly known as: VITAMIN D3 Take 1 tablet (1,000 Units total) by mouth daily.   citalopram 20 MG tablet Commonly known as: CELEXA Take 1 tablet (20 mg total) by mouth at bedtime.   Combivent Respimat 20-100 MCG/ACT Aers respimat Generic drug: Ipratropium-Albuterol Inhale 2 puffs into the lungs 2 (two) times daily.   FeroSul 325 (65 FE) MG tablet Generic drug: ferrous sulfate Take 1 tablet (325 mg total) by mouth 2 (two) times daily.   finasteride 5 MG tablet Commonly known as: PROSCAR Take 5 mg by mouth  daily.   fluticasone 50 MCG/ACT nasal spray Commonly known as: FLONASE Place 1 spray into both nostrils daily.   lamoTRIgine 100 MG tablet Commonly known as: LAMICTAL Take 1 tablet (100 mg total) by mouth daily.   Levemir FlexTouch 100 UNIT/ML FlexPen Generic drug: insulin detemir Inject 30 Units into the skin at bedtime. What changed:  how much to take additional instructions   metFORMIN 500 MG 24 hr tablet Commonly known as: GLUCOPHAGE-XR Take 2 tablets (1,000 mg total) by mouth daily with breakfast.   NovoLOG FlexPen 100 UNIT/ML FlexPen Generic drug: insulin aspart Inject 12 Units into the skin 3 (three) times daily with meals.   nystatin cream Commonly known as: MYCOSTATIN Apply 1 Application topically 2 (two) times daily.   omeprazole 20 MG capsule Commonly known as: PRILOSEC Take 1 capsule (20 mg total) by mouth daily.   risperiDONE 0.5 MG tablet Commonly known as: RISPERDAL Take 1 tablet (0.5 mg total) by mouth at bedtime.   senna-docusate 8.6-50 MG tablet Commonly known as: Senokot-S Take 2 tablets by mouth at bedtime. What changed:  how much to take when to take this reasons to take this   Systane Balance 0.6 % Soln Generic drug: Propylene Glycol Apply 1 drop to eye 2 (two) times daily. Both eyes   Tradjenta 5 MG Tabs tablet Generic drug: linagliptin Take 1 tablet (5 mg  total) by mouth daily.   traZODone 100 MG tablet Commonly known as: DESYREL Take 1 tablet (100 mg total) by mouth at bedtime.        Discharge Assessment: Vitals:   11/17/21 2145 11/18/21 0721  BP: 121/62   Pulse: 87   Resp: 20   Temp: 99.2 F (37.3 C)   SpO2: 95% 94%   Skin clean, dry and intact without evidence of skin break down, no evidence of skin tears noted. IV catheter discontinued intact. Site without signs and symptoms of complications - no redness or edema noted at insertion site, patient denies c/o pain - only slight tenderness at site.  Dressing with slight  pressure applied.  D/c Instructions-Education: Discharge instructions given to patient/family with verbalized understanding. D/c education completed with patient/family including follow up instructions, medication list, d/c activities limitations if indicated, with other d/c instructions as indicated by MD - patient able to verbalize understanding, all questions fully answered. Patient instructed to return to ED, call 911, or call MD for any changes in condition.  Patient escorted via WC, and D/C home via private auto.  Verl Dicker, RN 11/18/2021 9:16 AM

## 2021-11-20 ENCOUNTER — Other Ambulatory Visit: Payer: Self-pay

## 2021-11-20 ENCOUNTER — Encounter (HOSPITAL_COMMUNITY): Payer: Self-pay

## 2021-11-20 ENCOUNTER — Emergency Department (HOSPITAL_COMMUNITY)
Admission: EM | Admit: 2021-11-20 | Discharge: 2021-11-21 | Disposition: A | Discharge status: Skilled Nursing Facility | Payer: Medicare Other | Attending: Emergency Medicine | Admitting: Emergency Medicine

## 2021-11-20 DIAGNOSIS — Z794 Long term (current) use of insulin: Secondary | ICD-10-CM | POA: Insufficient documentation

## 2021-11-20 DIAGNOSIS — Z7982 Long term (current) use of aspirin: Secondary | ICD-10-CM | POA: Diagnosis not present

## 2021-11-20 DIAGNOSIS — Z7984 Long term (current) use of oral hypoglycemic drugs: Secondary | ICD-10-CM | POA: Diagnosis not present

## 2021-11-20 DIAGNOSIS — J449 Chronic obstructive pulmonary disease, unspecified: Secondary | ICD-10-CM | POA: Diagnosis not present

## 2021-11-20 DIAGNOSIS — R531 Weakness: Secondary | ICD-10-CM | POA: Insufficient documentation

## 2021-11-20 DIAGNOSIS — R350 Frequency of micturition: Secondary | ICD-10-CM | POA: Diagnosis not present

## 2021-11-20 DIAGNOSIS — Z7951 Long term (current) use of inhaled steroids: Secondary | ICD-10-CM | POA: Insufficient documentation

## 2021-11-20 DIAGNOSIS — E1165 Type 2 diabetes mellitus with hyperglycemia: Secondary | ICD-10-CM | POA: Insufficient documentation

## 2021-11-20 DIAGNOSIS — N189 Chronic kidney disease, unspecified: Secondary | ICD-10-CM | POA: Insufficient documentation

## 2021-11-20 DIAGNOSIS — R739 Hyperglycemia, unspecified: Secondary | ICD-10-CM

## 2021-11-20 LAB — CULTURE, BLOOD (ROUTINE X 2)
Culture: NO GROWTH
Culture: NO GROWTH
Special Requests: ADEQUATE
Special Requests: ADEQUATE

## 2021-11-20 LAB — BASIC METABOLIC PANEL
Anion gap: 8 (ref 5–15)
BUN: 28 mg/dL — ABNORMAL HIGH (ref 8–23)
CO2: 27 mmol/L (ref 22–32)
Calcium: 9 mg/dL (ref 8.9–10.3)
Chloride: 101 mmol/L (ref 98–111)
Creatinine, Ser: 1.79 mg/dL — ABNORMAL HIGH (ref 0.61–1.24)
GFR, Estimated: 39 mL/min — ABNORMAL LOW (ref >60)
Glucose, Bld: 375 mg/dL — ABNORMAL HIGH (ref 70–99)
Potassium: 4.4 mmol/L (ref 3.5–5.1)
Sodium: 136 mmol/L (ref 135–145)

## 2021-11-20 LAB — URINALYSIS, ROUTINE W REFLEX MICROSCOPIC
Bilirubin Urine: NEGATIVE
Glucose, UA: NEGATIVE mg/dL
Hgb urine dipstick: NEGATIVE
Ketones, ur: NEGATIVE mg/dL
Leukocytes,Ua: NEGATIVE
Nitrite: NEGATIVE
Protein, ur: NEGATIVE mg/dL
Specific Gravity, Urine: 1 — ABNORMAL LOW (ref 1.005–1.030)
pH: 5 (ref 5.0–8.0)

## 2021-11-20 LAB — CBC WITH DIFFERENTIAL/PLATELET
Abs Immature Granulocytes: 0.05 10*3/uL (ref 0.00–0.07)
Basophils Absolute: 0.1 10*3/uL (ref 0.0–0.1)
Basophils Relative: 1 %
Eosinophils Absolute: 0 10*3/uL (ref 0.0–0.5)
Eosinophils Relative: 0 %
HCT: 33 % — ABNORMAL LOW (ref 39.0–52.0)
Hemoglobin: 10.9 g/dL — ABNORMAL LOW (ref 13.0–17.0)
Immature Granulocytes: 1 %
Lymphocytes Relative: 13 %
Lymphs Abs: 1.3 10*3/uL (ref 0.7–4.0)
MCH: 29.7 pg (ref 26.0–34.0)
MCHC: 33 g/dL (ref 30.0–36.0)
MCV: 89.9 fL (ref 80.0–100.0)
Monocytes Absolute: 1 10*3/uL (ref 0.1–1.0)
Monocytes Relative: 11 %
Neutro Abs: 7 10*3/uL (ref 1.7–7.7)
Neutrophils Relative %: 74 %
Platelets: 230 10*3/uL (ref 150–400)
RBC: 3.67 MIL/uL — ABNORMAL LOW (ref 4.22–5.81)
RDW: 13.7 % (ref 11.5–15.5)
WBC: 9.5 10*3/uL (ref 4.0–10.5)
nRBC: 0 % (ref 0.0–0.2)

## 2021-11-20 LAB — CBG MONITORING, ED
Glucose-Capillary: 358 mg/dL — ABNORMAL HIGH (ref 70–99)
Glucose-Capillary: 310 mg/dL — ABNORMAL HIGH (ref 70–99)

## 2021-11-20 LAB — BETA-HYDROXYBUTYRIC ACID: Beta-Hydroxybutyric Acid: 0.18 mmol/L (ref 0.05–0.27)

## 2021-11-20 LAB — MAGNESIUM: Magnesium: 1.8 mg/dL (ref 1.7–2.4)

## 2021-11-20 MED ORDER — ATORVASTATIN CALCIUM 10 MG PO TABS
20.0000 mg | ORAL_TABLET | Freq: Every day | ORAL | Status: DC
  Administered 2021-11-20 – 2021-11-21 (×2): 20 mg via ORAL
  Filled 2021-11-20 (×2): qty 2

## 2021-11-20 MED ORDER — PANTOPRAZOLE SODIUM 40 MG PO TBEC
40.0000 mg | DELAYED_RELEASE_TABLET | Freq: Every day | ORAL | Status: DC
  Administered 2021-11-20 – 2021-11-21 (×2): 40 mg via ORAL
  Filled 2021-11-20 (×2): qty 1

## 2021-11-20 MED ORDER — LINAGLIPTIN 5 MG PO TABS
5.0000 mg | ORAL_TABLET | Freq: Every day | ORAL | Status: DC
  Administered 2021-11-20 – 2021-11-21 (×2): 5 mg via ORAL
  Filled 2021-11-20 (×2): qty 1

## 2021-11-20 MED ORDER — METFORMIN HCL ER 500 MG PO TB24
1000.0000 mg | ORAL_TABLET | Freq: Every day | ORAL | Status: DC
Start: 2021-11-21 — End: 2021-11-21
  Administered 2021-11-21: 1000 mg via ORAL
  Filled 2021-11-20 (×2): qty 2

## 2021-11-20 MED ORDER — FINASTERIDE 5 MG PO TABS
5.0000 mg | ORAL_TABLET | Freq: Every day | ORAL | Status: DC
  Administered 2021-11-20 – 2021-11-21 (×2): 5 mg via ORAL
  Filled 2021-11-20 (×2): qty 1

## 2021-11-20 MED ORDER — RISPERIDONE 1 MG PO TABS
0.5000 mg | ORAL_TABLET | Freq: Every day | ORAL | Status: DC
  Administered 2021-11-20: 0.5 mg via ORAL
  Filled 2021-11-20: qty 1

## 2021-11-20 MED ORDER — INSULIN ASPART 100 UNIT/ML IJ SOLN: 12.0000 [IU] | Freq: Three times a day (TID) | INTRAMUSCULAR | Status: DC

## 2021-11-20 MED ORDER — INSULIN ASPART 100 UNIT/ML IJ SOLN
5.0000 [IU] | Freq: Once | INTRAMUSCULAR | Status: AC
  Administered 2021-11-20: 5 [IU] via SUBCUTANEOUS
  Filled 2021-11-20: qty 1

## 2021-11-20 MED ORDER — FLUTICASONE PROPIONATE 50 MCG/ACT NA SUSP
1.0000 | Freq: Every day | NASAL | Status: DC
  Administered 2021-11-20 – 2021-11-21 (×2): 1 via NASAL
  Filled 2021-11-20 (×2): qty 16

## 2021-11-20 MED ORDER — TRAZODONE HCL 50 MG PO TABS
100.0000 mg | ORAL_TABLET | Freq: Every day | ORAL | Status: DC
  Administered 2021-11-20: 100 mg via ORAL
  Filled 2021-11-20: qty 2

## 2021-11-20 MED ORDER — SODIUM CHLORIDE 0.9 % IV BOLUS
1000.0000 mL | Freq: Once | INTRAVENOUS | Status: AC
Start: 2021-11-20 — End: 2021-11-20
  Administered 2021-11-20: 1000 mL via INTRAVENOUS

## 2021-11-20 MED ORDER — SENNOSIDES-DOCUSATE SODIUM 8.6-50 MG PO TABS
2.0000 | ORAL_TABLET | Freq: Every day | ORAL | Status: DC
  Administered 2021-11-20: 2 via ORAL
  Filled 2021-11-20: qty 2

## 2021-11-20 MED ORDER — LORATADINE 10 MG PO TABS
10.0000 mg | ORAL_TABLET | Freq: Every day | ORAL | Status: DC
  Administered 2021-11-20 – 2021-11-21 (×2): 10 mg via ORAL
  Filled 2021-11-20 (×2): qty 1

## 2021-11-20 MED ORDER — ALFUZOSIN HCL ER 10 MG PO TB24
10.0000 mg | ORAL_TABLET | Freq: Every day | ORAL | Status: DC
  Administered 2021-11-21: 10 mg via ORAL
  Filled 2021-11-20: qty 1

## 2021-11-20 MED ORDER — LAMOTRIGINE 25 MG PO TABS
100.0000 mg | ORAL_TABLET | Freq: Every day | ORAL | Status: DC
  Administered 2021-11-20 – 2021-11-21 (×2): 100 mg via ORAL
  Filled 2021-11-20 (×2): qty 4

## 2021-11-20 MED ORDER — CEFDINIR 300 MG PO CAPS
300.0000 mg | ORAL_CAPSULE | Freq: Two times a day (BID) | ORAL | Status: DC
  Administered 2021-11-20 – 2021-11-21 (×2): 300 mg via ORAL
  Filled 2021-11-20 (×2): qty 1

## 2021-11-20 MED ORDER — ASPIRIN 81 MG PO TBEC
81.0000 mg | DELAYED_RELEASE_TABLET | Freq: Every day | ORAL | Status: DC
  Administered 2021-11-20 – 2021-11-21 (×2): 81 mg via ORAL
  Filled 2021-11-20 (×2): qty 1

## 2021-11-20 MED ORDER — ALBUTEROL SULFATE HFA 108 (90 BASE) MCG/ACT IN AERS: 1.0000 | INHALATION_SPRAY | RESPIRATORY_TRACT | Status: DC | PRN

## 2021-11-20 MED ORDER — VITAMIN D 25 MCG (1000 UNIT) PO TABS
1000.0000 [IU] | ORAL_TABLET | Freq: Every day | ORAL | Status: DC
  Administered 2021-11-20 – 2021-11-21 (×2): 1000 [IU] via ORAL
  Filled 2021-11-20 (×2): qty 1

## 2021-11-20 NOTE — ED Provider Notes (Signed)
[facility] EMERGENCY DEPARTMENT Provider Note   CSN: [id number] Arrival date & time: 11/20/21  0900     History Chief Complaint  Patient presents with  . Weakness    Douglas Cook is a 76 y.o. male with history of chronic kidney disease, COPD, diabetes, hyperlipidemia who presents to the emergency department today for generalized weakness that started this morning.  Chart review reveals that the patient was just discharged 2 days ago after being admitted for sepsis.  This was secondary to a urinary tract infection.  He was treated with IV antibiotics here and discharged with p.o. Omnicef.  He has not missed any doses.  Patient unable to provide a lot of the history and just states that he feels generally weak and not well.  EMS stated that his blood sugar on their arrival was in the 300s which is abnormal for him.  All the vital signs were normal.  [facility] confirmed that his blood sugar was in the 300s which is abnormal for him.  They state that they know when he has an infection when his blood sugar goes above 300.  They state that it took several nurses to get him into the wheelchair this morning.  They also stated that he felt warm but did not take his temperature.  Ultimately, he was sent to the ED for further evaluation.  Patient endorses urinary frequency and denies fever, chills, chest pain, shortness of breath.   Weakness      Home Medications Prior to Admission medications   Medication Sig Start Date End Date Taking? Authorizing Provider  acetaminophen (TYLENOL) 325 MG tablet Take 2 tablets (650 mg total) by mouth every 6 (six) hours as needed for mild pain (or Fever >/= 101). 03/22/21   Sharee Holster, NP  albuterol (VENTOLIN HFA) 108 (90 Base) MCG/ACT inhaler Inhale 1-2 puffs into the lungs every 4 (four) hours as needed for wheezing or shortness of breath.    [provider]  alfuzosin (UROXATRAL) 10 MG 24 hr tablet Take 1 tablet (10  mg total) by mouth daily with breakfast. 10/24/21   Stoneking, Danford Bad., MD  Alpha-Lipoic Acid 100 MG CAPS Take 300 mg by mouth daily. 08/30/21   [provider]  aspirin EC 81 MG tablet Take 1 tablet (81 mg total) by mouth daily. Swallow whole. 03/22/21   Sharee Holster, NP  atorvastatin (LIPITOR) 20 MG tablet Take 1 tablet (20 mg total) by mouth daily. 03/22/21   Sharee Holster, NP  cefdinir (OMNICEF) 300 MG capsule Take 1 capsule (300 mg total) by mouth 2 (two) times daily for 5 days. 11/17/21 11/22/21  Shon Hale, MD  cetirizine (ZYRTEC) 10 MG tablet Take 1 tablet (10 mg total) by mouth at bedtime. 03/22/21   Sharee Holster, NP  cholecalciferol (VITAMIN D) 25 MCG (1000 UNIT) tablet Take 1 tablet (1,000 Units total) by mouth daily. 03/22/21   Sharee Holster, NP  citalopram (CELEXA) 20 MG tablet Take 1 tablet (20 mg total) by mouth at bedtime. 03/22/21   Sharee Holster, NP  FEROSUL 325 (65 Fe) MG tablet Take 1 tablet (325 mg total) by mouth 2 (two) times daily. 03/22/21   Sharee Holster, NP  finasteride (PROSCAR) 5 MG tablet Take 5 mg by mouth daily. 09/09/21   [provider]  fluticasone (FLONASE) 50 MCG/ACT nasal spray Place 1 spray into both nostrils daily. 03/22/21   Sharee Holster, NP  Ipratropium-Albuterol (  COMBIVENT RESPIMAT) 20-100 MCG/ACT AERS respimat Inhale 2 puffs into the lungs 2 (two) times daily. 03/22/21   Sharee Holster, NP  lamoTRIgine (LAMICTAL) 100 MG tablet Take 1 tablet (100 mg total) by mouth daily. 03/22/21   Sharee Holster, NP  LEVEMIR FLEXTOUCH 100 UNIT/ML FlexTouch Pen Inject 30 Units into the skin at bedtime. Patient taking differently: Inject 60 Units into the skin at bedtime. If over 400 call MD and hold below 60 03/22/21   Chilton Si, Chong Sicilian, NP  metFORMIN (GLUCOPHAGE-XR) 500 MG 24 hr tablet Take 2 tablets (1,000 mg total) by mouth daily with breakfast. 03/22/21   Sharee Holster, NP  NOVOLOG FLEXPEN 100 UNIT/ML FlexPen Inject 12  Units into the skin 3 (three) times daily with meals. 03/22/21   [provider]  nystatin cream (MYCOSTATIN) Apply 1 Application topically 2 (two) times daily. 11/13/21   [provider]  omeprazole (PRILOSEC) 20 MG capsule Take 1 capsule (20 mg total) by mouth daily. 03/22/21   Sharee Holster, NP  Propylene Glycol (SYSTANE BALANCE) 0.6 % SOLN Apply 1 drop to eye 2 (two) times daily. Both eyes 03/22/21   Sharee Holster, NP  risperiDONE (RISPERDAL) 0.5 MG tablet Take 1 tablet (0.5 mg total) by mouth at bedtime. 03/22/21   Sharee Holster, NP  senna-docusate (SENOKOT-S) 8.6-50 MG tablet Take 2 tablets by mouth at bedtime. 11/17/21   Shon Hale, MD  TRADJENTA 5 MG TABS tablet Take 1 tablet (5 mg total) by mouth daily. 03/22/21   Sharee Holster, NP  traZODone (DESYREL) 100 MG tablet Take 1 tablet (100 mg total) by mouth at bedtime. 03/22/21   Sharee Holster, NP      Allergies    Patient has no known allergies.    Review of Systems   Review of Systems  Neurological:  Positive for weakness.  All other systems reviewed and are negative.   Physical Exam Updated Vital Signs BP 119/67   Pulse 75   Resp 16   SpO2 (!) 83%  Physical Exam Vitals and nursing note reviewed.  Constitutional:      General: He is not in acute distress.    Appearance: Normal appearance.  HENT:     Head: Normocephalic and atraumatic.     Mouth/Throat:     Mouth: Mucous membranes are dry.  Eyes:     General:        Right eye: No discharge.        Left eye: No discharge.  Cardiovascular:     Comments: Regular rate and rhythm.  S1/S2 are distinct without any evidence of murmur, rubs, or gallops.  Radial pulses are 2+ bilaterally.  Dorsalis pedis pulses are 2+ bilaterally.  No evidence of pedal edema. Pulmonary:     Comments: Clear to auscultation bilaterally.  Normal effort.  No respiratory distress.  No evidence of wheezes, rales, or rhonchi heard throughout. Abdominal:      General: Abdomen is flat. Bowel sounds are normal. There is no distension.     Tenderness: There is no abdominal tenderness. There is no guarding or rebound.  Musculoskeletal:        General: Normal range of motion.     Cervical back: Neck supple.  Skin:    General: Skin is warm and dry.     Findings: No rash.  Neurological:     General: No focal deficit present.     Mental Status: He is alert.  Psychiatric:  Mood and Affect: Mood normal.        Behavior: Behavior normal.     ED Results / Procedures / Treatments   Labs (all labs ordered are listed, but only abnormal results are displayed) Labs Reviewed  CBC WITH DIFFERENTIAL/PLATELET - Abnormal; Notable for the following components:      Result Value   RBC 3.67 (*)    Hemoglobin 10.9 (*)    HCT 33.0 (*)    All other components within normal limits  BASIC METABOLIC PANEL - Abnormal; Notable for the following components:   Glucose, Bld 375 (*)    BUN 28 (*)    Creatinine, Ser 1.79 (*)    GFR, Estimated 39 (*)    All other components within normal limits  URINALYSIS, ROUTINE W REFLEX MICROSCOPIC - Abnormal; Notable for the following components:   APPearance CLOUDY (*)    Specific Gravity, Urine 1.000 (*)    All other components within normal limits  CBG MONITORING, ED - Abnormal; Notable for the following components:   Glucose-Capillary 358 (*)    All other components within normal limits  CBG MONITORING, ED - Abnormal; Notable for the following components:   Glucose-Capillary 310 (*)    All other components within normal limits  URINE CULTURE  BETA-HYDROXYBUTYRIC ACID  MAGNESIUM    EKG None  Radiology No results found.  Procedures Procedures    Medications Ordered in ED Medications  sodium chloride 0.9 % bolus 1,000 mL (0 mLs Intravenous Stopped 11/20/21 1434)  insulin aspart (novoLOG) injection 5 Units (5 Units Subcutaneous Given 11/20/21 1109)    ED Course/ Medical Decision Making/ A&P Clinical  Course as of 11/20/21 1832  Mon Nov 20, 2021  1112 CBC with Differential(!) No evidence of leukocytosis.  There is anemia which seems to be chronic for the patient and is also in the setting of chronic kidney disease. [CF]  1113 POC CBG, ED(!) 358. [CF]  1114 Beta-hydroxybutyric acid Normal.  Less likely to be DKA. [CF]  1114 Basic metabolic panel(!) Bicarb is normal.  Electrolytes are normal.  Patient does have an elevated creatinine in the setting of chronic kidney disease.  This does to be at baseline for the patient.  Anion gap is normal.  I have a low suspicion for HHS or DKA at this time. [CF]  1603 Urinalysis, Routine w reflex microscopic Urine, Clean Catch(!) Does not appear to be infected. [CF]  1831 Upon discharge, patient was still very generally weak.  He normally gets around with a wheelchair. After speaking with the nursing home facility do not feel comfortable getting him and I will contact social work for guidance.  Patient will be placed in a border and I will order his home medications. [CF]    Clinical Course User Index [CF] Teressa Lower, PA-C                           Medical Decision Making ADDIEL MCCARDLE is a 76 y.o. male patient who presents to the emergency department for further evaluation of generalized weakness.  Given that the patient was just discharged in the hospital 2 days ago I will repeat some of the labs to further evaluate.  I will also retest his urine to look for signs of infection.  His blood sugar is elevated in the high 300s which is likely the contributing cause.  I will give him some fluids in addition to 5 units of insulin.  Labs resulted.  Does not appear to be related to recurrent UTI.  After fluids and insulin patient is feeling better.  Repeat blood sugar is coming down.  I will have him follow-up with his primary care doctor for further evaluation.  He is in no acute distress at this time.  He is safe for discharge at this time.  Strict  return precautions were discussed.   Amount and/or Complexity of Data Reviewed Labs: ordered. Decision-making details documented in ED Course.  Risk Prescription drug management.   Final Clinical Impression(s) / ED Diagnoses Final diagnoses:  General weakness  Hyperglycemia    Rx / DC Orders ED Discharge Orders     None         Jolyn Lent 11/20/21 1608    Eber Hong, MD 11/21/21 [phone]

## 2021-11-20 NOTE — ED Notes (Signed)
Spoke with Tammy, Interior and spatial designer at [employer]. She reports that the pt was sent to the ER this morning for weakness and altered mental status. She advised that she is unable to accept the patient if his status had not improved to his baseline.

## 2021-11-20 NOTE — ED Notes (Signed)
EDP in room at time of triage for MSE.  ?

## 2021-11-20 NOTE — Discharge Instructions (Signed)
Please continue taking your antibiotics for your urinary tract infection.  Please follow-up with your primary care doctor for further evaluation.  [facility] to the [facility] for any worsening symptoms.

## 2021-11-20 NOTE — ED Notes (Addendum)
Ambulation attempted. Patient was able to maintain sitting position w/o back support briefly. He was not able to stand and ambulate. Further interview with the patient revealed that he is wheelchair bound at baseline. Pt deemed wheelchair safe currently.

## 2021-11-20 NOTE — ED Triage Notes (Signed)
Patient BIB EMS with complaints of generalized weakness [facility] for the past week. Currently on ABT for UTI. CBG per EMS greater than 400. Denies pain at this time.

## 2021-11-20 NOTE — ED Notes (Signed)
Pt care taken, no complaints at this time. 

## 2021-11-21 DIAGNOSIS — R531 Weakness: Secondary | ICD-10-CM | POA: Diagnosis not present

## 2021-11-21 LAB — CBG MONITORING, ED
Glucose-Capillary: 321 mg/dL — ABNORMAL HIGH (ref 70–99)
Glucose-Capillary: 307 mg/dL — ABNORMAL HIGH (ref 70–99)
Glucose-Capillary: 317 mg/dL — ABNORMAL HIGH (ref 70–99)

## 2021-11-21 LAB — URINE CULTURE: Culture: <10000 — AB

## 2021-11-21 MED ORDER — INSULIN GLARGINE-YFGN 100 UNIT/ML ~~LOC~~ SOLN
20.0000 [IU] | Freq: Every day | SUBCUTANEOUS | Status: DC
  Administered 2021-11-21: 20 [IU] via SUBCUTANEOUS
  Filled 2021-11-21 (×2): qty 0.2

## 2021-11-21 MED ORDER — ALBUTEROL SULFATE (2.5 MG/3ML) 0.083% IN NEBU
2.5000 mg | INHALATION_SOLUTION | RESPIRATORY_TRACT | Status: DC | PRN
Start: 2021-11-21 — End: 2021-11-21

## 2021-11-21 MED ORDER — INSULIN ASPART 100 UNIT/ML IJ SOLN: 4.0000 [IU] | Freq: Three times a day (TID) | INTRAMUSCULAR | Status: DC

## 2021-11-21 MED ORDER — INSULIN ASPART 100 UNIT/ML IJ SOLN
0.0000 [IU] | Freq: Three times a day (TID) | INTRAMUSCULAR | Status: DC
  Administered 2021-11-21 (×2): 11 [IU] via SUBCUTANEOUS
  Filled 2021-11-21 (×2): qty 1

## 2021-11-21 MED ORDER — INSULIN ASPART 100 UNIT/ML IJ SOLN: 0.0000 [IU] | Freq: Every day | INTRAMUSCULAR | Status: DC

## 2021-11-21 NOTE — ED Notes (Signed)
Per [place] Herb, LCSWA, pt has a bed at [facility]

## 2021-11-21 NOTE — [place] FL2 (Signed)
[place] MEDICAID FL2 LEVEL OF CARE SCREENING TOOL     IDENTIFICATION  Patient Name: Douglas Cook Birthdate: [birth year] Sex: male Admission Date (Current Location): 11/20/2021  [place] and [place] Number:  Douglas Cook [id number] [facility] Facility and Address:  [facility],  [address], [place] [address]      Provider Number: [id number]  Attending Physician Name and Address:  Default, Provider, MD  Relative Name and Phone Number:  Coffman,Carolyn (Sister) [phone]    Current Level of Care: Hospital Recommended Level of Care: Skilled Nursing Facility Prior Approval Number:    Date Approved/Denied:   PASRR Number: [id number]  Discharge Plan: SNF    Current Diagnoses: Patient Active Problem List   Diagnosis Date Noted   Cellulitis 11/17/2021   Uncontrolled type 2 diabetes mellitus with hyperglycemia, with long-term current use of insulin (HCC) 11/16/2021   Urinary tract infection 09/18/2021   Hyperglycemia 09/17/2021   Urinary retention 08/10/2021   History of UTI 08/10/2021   Acute metabolic encephalopathy 06/18/2021   Sepsis secondary to UTI (HCC) 05/09/2021   Major neurocognitive disorder (HCC) 02/10/2021   HTN (hypertension) 02/08/2021   BPH without obstruction/lower urinary tract symptoms 02/08/2021   Anemia due to stage 3b chronic kidney disease (HCC) 02/08/2021   CKD stage 3 due to type 2 diabetes mellitus (HCC) 02/08/2021   Chronic constipation 02/08/2021   Major depression, recurrent, chronic (HCC) 02/08/2021   UTI (urinary tract infection) 02/08/2021   Closed fracture of neck of left femur (HCC)    Hip fracture (HCC) 02/01/2021   Acute kidney injury superimposed on CKD (HCC) 07/04/2017   COPD (chronic obstructive pulmonary disease) (HCC) 07/04/2017   Controlled type 2 diabetes mellitus without complication, without long-term current use of insulin (HCC) 07/04/2017   GERD (gastroesophageal reflux disease) 07/04/2017   Mixed hyperlipidemia  07/04/2017   Depression 07/04/2017    Orientation RESPIRATION BLADDER Height & Weight     Self, Situation, Place  Normal Continent Weight:   Height:     BEHAVIORAL SYMPTOMS/MOOD NEUROLOGICAL BOWEL NUTRITION STATUS      Continent Diet (Heart healthy)  AMBULATORY STATUS COMMUNICATION OF NEEDS Skin   Extensive Assist Verbally Normal                       Personal Care Assistance Level of Assistance  Bathing, Feeding, Dressing Bathing Assistance: Maximum assistance Feeding assistance: Limited assistance Dressing Assistance: Maximum assistance     Functional Limitations Info  Sight, Hearing, Speech Sight Info: Adequate Hearing Info: Adequate Speech Info: Adequate    SPECIAL CARE FACTORS FREQUENCY  PT (By licensed PT), OT (By licensed OT)     PT Frequency: 5 times weekly OT Frequency: 5 times weekly            Contractures Contractures Info: Not present    Additional Factors Info  Code Status, Allergies Code Status Info: FULL Allergies Info: NKA           Current Medications (11/21/2021):  This is the current hospital active medication list Current Facility-Administered Medications  Medication Dose Route Frequency Provider Last Rate Last Admin   albuterol (PROVENTIL) (2.5 MG/3ML) 0.083% nebulizer solution 2.5 mg  2.5 mg Nebulization Q4H PRN Honor Loh M, PA-C       alfuzosin (UROXATRAL) 24 hr tablet 10 mg  10 mg Oral Q breakfast Honor Loh M, PA-C   10 mg at 11/21/21 0957   aspirin EC tablet 81 mg  81 mg Oral Daily Honor Loh M,  PA-C   81 mg at 11/21/21 0959   atorvastatin (LIPITOR) tablet 20 mg  20 mg Oral Daily Honor Loh M, [place]   20 mg at 11/21/21 8101   cefdinir (OMNICEF) capsule 300 mg  300 mg Oral BID Honor Loh M, PA-C   300 mg at 11/21/21 0957   cholecalciferol (VITAMIN D3) 25 MCG (1000 UNIT) tablet 1,000 Units  1,000 Units Oral Daily Honor Loh M, PA-C   1,000 Units at 11/21/21 7510   finasteride (PROSCAR) tablet 5 mg  5  mg Oral Daily Honor Loh M, PA-C   5 mg at 11/21/21 0957   fluticasone (FLONASE) 50 MCG/ACT nasal spray 1 spray  1 spray Each Nare Daily Honor Loh M, PA-C   1 spray at 11/21/21 1001   insulin aspart (novoLOG) injection 0-15 Units  0-15 Units Subcutaneous TID WC Pricilla Loveless, MD       insulin aspart (novoLOG) injection 0-5 Units  0-5 Units Subcutaneous QHS Pricilla Loveless, MD       insulin aspart (novoLOG) injection 4 Units  4 Units Subcutaneous TID WC Pricilla Loveless, MD       insulin glargine-yfgn (SEMGLEE) injection 20 Units  20 Units Subcutaneous Daily Pricilla Loveless, MD       lamoTRIgine (LAMICTAL) tablet 100 mg  100 mg Oral Daily Honor Loh M, PA-C   100 mg at 11/21/21 2585   linagliptin (TRADJENTA) tablet 5 mg  5 mg Oral Daily Honor Loh M, PA-C   5 mg at 11/21/21 0959   loratadine (CLARITIN) tablet 10 mg  10 mg Oral Daily Honor Loh M, [place]   10 mg at 11/21/21 0957   metFORMIN (GLUCOPHAGE-XR) 24 hr tablet 1,000 mg  1,000 mg Oral Q breakfast Honor Loh M, PA-C   1,000 mg at 11/21/21 0957   pantoprazole (PROTONIX) EC tablet 40 mg  40 mg Oral Daily Honor Loh M, [place]   40 mg at 11/21/21 1001   risperiDONE (RISPERDAL) tablet 0.5 mg  0.5 mg Oral QHS Honor Loh M, PA-C   0.5 mg at 11/20/21 2201   senna-docusate (Senokot-S) tablet 2 tablet  2 tablet Oral QHS Honor Loh M, [place]   2 tablet at 11/20/21 2200   traZODone (DESYREL) tablet 100 mg  100 mg Oral QHS Honor Loh M, PA-C   100 mg at 11/20/21 2201   Current Outpatient Medications  Medication Sig Dispense Refill   acetaminophen (TYLENOL) 325 MG tablet Take 2 tablets (650 mg total) by mouth every 6 (six) hours as needed for mild pain (or Fever >/= 101). 30 tablet 0   albuterol (VENTOLIN HFA) 108 (90 Base) MCG/ACT inhaler Inhale 1-2 puffs into the lungs every 4 (four) hours as needed for wheezing or shortness of breath.     alfuzosin (UROXATRAL) 10 MG 24 hr tablet Take 1 tablet (10 mg total)  by mouth daily with breakfast. 30 tablet 11   Alpha-Lipoic Acid 100 MG CAPS Take 300 mg by mouth daily.     aspirin EC 81 MG tablet Take 1 tablet (81 mg total) by mouth daily. Swallow whole. 30 tablet 0   atorvastatin (LIPITOR) 20 MG tablet Take 1 tablet (20 mg total) by mouth daily. 30 tablet 0   cefdinir (OMNICEF) 300 MG capsule Take 1 capsule (300 mg total) by mouth 2 (two) times daily for 5 days. 10 capsule 0   cetirizine (ZYRTEC) 10 MG tablet Take 1 tablet (10 mg total) by mouth at bedtime. 30 tablet 0  cholecalciferol (VITAMIN D) 25 MCG (1000 UNIT) tablet Take 1 tablet (1,000 Units total) by mouth daily. 30 tablet 0   citalopram (CELEXA) 20 MG tablet Take 1 tablet (20 mg total) by mouth at bedtime. 30 tablet 0   FEROSUL 325 (65 Fe) MG tablet Take 1 tablet (325 mg total) by mouth 2 (two) times daily. 60 tablet 0   finasteride (PROSCAR) 5 MG tablet Take 5 mg by mouth daily.     fluticasone (FLONASE) 50 MCG/ACT nasal spray Place 1 spray into both nostrils daily. 9.9 mL 0   Ipratropium-Albuterol (COMBIVENT RESPIMAT) 20-100 MCG/ACT AERS respimat Inhale 2 puffs into the lungs 2 (two) times daily. 4 g 0   lamoTRIgine (LAMICTAL) 100 MG tablet Take 1 tablet (100 mg total) by mouth daily. 30 tablet 0   LEVEMIR FLEXTOUCH 100 UNIT/ML FlexTouch Pen Inject 30 Units into the skin at bedtime. (Patient taking differently: Inject 52 Units into the skin at bedtime. If over 400 call MD and hold below 60) 15 mL 0   metFORMIN (GLUCOPHAGE-XR) 500 MG 24 hr tablet Take 2 tablets (1,000 mg total) by mouth daily with breakfast. 60 tablet 0   NOVOLOG FLEXPEN 100 UNIT/ML FlexPen Inject 12 Units into the skin 3 (three) times daily with meals.     nystatin cream (MYCOSTATIN) Apply 1 Application topically 2 (two) times daily.     omeprazole (PRILOSEC) 20 MG capsule Take 1 capsule (20 mg total) by mouth daily. 30 capsule 0   Propylene Glycol (SYSTANE BALANCE) 0.6 % SOLN Apply 1 drop to eye 2 (two) times daily. Both eyes 5  mL 0   risperiDONE (RISPERDAL) 0.5 MG tablet Take 1 tablet (0.5 mg total) by mouth at bedtime. 30 tablet 0   senna-docusate (SENOKOT-S) 8.6-50 MG tablet Take 2 tablets by mouth at bedtime. 30 tablet 0   sulfamethoxazole-trimethoprim (BACTRIM DS) 800-160 MG tablet Take 1 tablet by mouth 2 (two) times daily.     TRADJENTA 5 MG TABS tablet Take 1 tablet (5 mg total) by mouth daily. 30 tablet 0   traZODone (DESYREL) 100 MG tablet Take 1 tablet (100 mg total) by mouth at bedtime. 30 tablet 0     Discharge Medications: Please see discharge summary for a list of discharge medications.  Relevant Imaging Results:  Relevant Lab Results:   Additional Information SSN: [id number], [place]

## 2021-11-21 NOTE — ED Notes (Addendum)
Wrong pt chart, disregard note.

## 2021-11-21 NOTE — ED Notes (Signed)
CSW updated that PT is recommending SNF for pt at D/C. CSW spoke with pt in room about this. Pt is agreeable to SNF referral being completed and sent out to facilities in [place] and [place]. CSW completed referral and sent out to facilities. CSW awaiting bed offers at this time.

## 2021-11-21 NOTE — ED Notes (Signed)
Blanchable redness noted on pt's sacrum, sacral pad applied to the area and pt repositioned

## 2021-11-21 NOTE — ED Notes (Signed)
Per physical therapist, pt will need a lift to get back in bed. Pt is unable to stand without maximum assistance.

## 2021-11-21 NOTE — [place] FL2 (Signed)
[place] MEDICAID FL2 LEVEL OF CARE SCREENING TOOL     IDENTIFICATION  Patient Name: Douglas Cook Birthdate: [birth year] Sex: male Admission Date (Current Location): 11/20/2021  [place] and [place] Number:  [name] [id number] [facility] Facility and Address:  [facility],  [address], [place] [address]      Provider Number: [id number]  Attending Physician Name and Address:  Default, Provider, MD  Relative Name and Phone Number:  Coffman,Carolyn (Sister) [phone]    Current Level of Care: Hospital Recommended Level of Care: Skilled Nursing Facility Prior Approval Number:    Date Approved/Denied:   PASRR Number: [id number]  Discharge Plan: SNF    Current Diagnoses: Patient Active Problem List   Diagnosis Date Noted   Cellulitis 11/17/2021   Uncontrolled type 2 diabetes mellitus with hyperglycemia, with long-term current use of insulin (HCC) 11/16/2021   Urinary tract infection 09/18/2021   Hyperglycemia 09/17/2021   Urinary retention 08/10/2021   History of UTI 08/10/2021   Acute metabolic encephalopathy 06/18/2021   Sepsis secondary to UTI (HCC) 05/09/2021   Major neurocognitive disorder (HCC) 02/10/2021   HTN (hypertension) 02/08/2021   BPH without obstruction/lower urinary tract symptoms 02/08/2021   Anemia due to stage 3b chronic kidney disease (HCC) 02/08/2021   CKD stage 3 due to type 2 diabetes mellitus (HCC) 02/08/2021   Chronic constipation 02/08/2021   Major depression, recurrent, chronic (HCC) 02/08/2021   UTI (urinary tract infection) 02/08/2021   Closed fracture of neck of left femur (HCC)    Hip fracture (HCC) 02/01/2021   Acute kidney injury superimposed on CKD (HCC) 07/04/2017   COPD (chronic obstructive pulmonary disease) (HCC) 07/04/2017   Controlled type 2 diabetes mellitus without complication, without long-term current use of insulin (HCC) 07/04/2017   GERD (gastroesophageal reflux disease) 07/04/2017   Mixed hyperlipidemia  07/04/2017   Depression 07/04/2017    Orientation RESPIRATION BLADDER Height & Weight     Self, Situation, Place  Normal Continent Weight:   Height:     BEHAVIORAL SYMPTOMS/MOOD NEUROLOGICAL BOWEL NUTRITION STATUS      Continent Diet (Heart healthy)  AMBULATORY STATUS COMMUNICATION OF NEEDS Skin   Extensive Assist Verbally Normal                       Personal Care Assistance Level of Assistance  Bathing, Feeding, Dressing Bathing Assistance: Maximum assistance Feeding assistance: Limited assistance Dressing Assistance: Maximum assistance     Functional Limitations Info  Sight, Hearing, Speech Sight Info: Adequate Hearing Info: Adequate Speech Info: Adequate    SPECIAL CARE FACTORS FREQUENCY  PT (By licensed PT), OT (By licensed OT)     PT Frequency: 5 times weekly OT Frequency: 5 times weekly            Contractures Contractures Info: Not present    Additional Factors Info  Code Status, Allergies Code Status Info: FULL Allergies Info: NKA           Current Medications (11/21/2021):  This is the current hospital active medication list Current Facility-Administered Medications  Medication Dose Route Frequency Provider Last Rate Last Admin   albuterol (PROVENTIL) (2.5 MG/3ML) 0.083% nebulizer solution 2.5 mg  2.5 mg Nebulization Q4H PRN Honor Loh M, PA-C       alfuzosin (UROXATRAL) 24 hr tablet 10 mg  10 mg Oral Q breakfast Honor Loh M, PA-C   10 mg at 11/21/21 0957   aspirin EC tablet 81 mg  81 mg Oral Daily Honor Loh M,  PA-C   81 mg at 11/21/21 0959   atorvastatin (LIPITOR) tablet 20 mg  20 mg Oral Daily Honor Loh M, [place]   20 mg at 11/21/21 1610   cefdinir (OMNICEF) capsule 300 mg  300 mg Oral BID Honor Loh M, PA-C   300 mg at 11/21/21 0957   cholecalciferol (VITAMIN D3) 25 MCG (1000 UNIT) tablet 1,000 Units  1,000 Units Oral Daily Honor Loh M, PA-C   1,000 Units at 11/21/21 9604   finasteride (PROSCAR) tablet 5 mg  5  mg Oral Daily Honor Loh M, PA-C   5 mg at 11/21/21 0957   fluticasone (FLONASE) 50 MCG/ACT nasal spray 1 spray  1 spray Each Nare Daily Honor Loh M, PA-C   1 spray at 11/21/21 1001   insulin aspart (novoLOG) injection 0-15 Units  0-15 Units Subcutaneous TID WC Pricilla Loveless, MD   11 Units at 11/21/21 1153   insulin aspart (novoLOG) injection 0-5 Units  0-5 Units Subcutaneous QHS Pricilla Loveless, MD       insulin glargine-yfgn [facility]) injection 20 Units  20 Units Subcutaneous Daily Pricilla Loveless, MD   20 Units at 11/21/21 1034   lamoTRIgine (LAMICTAL) tablet 100 mg  100 mg Oral Daily Honor Loh M, PA-C   100 mg at 11/21/21 5409   linagliptin (TRADJENTA) tablet 5 mg  5 mg Oral Daily Honor Loh M, PA-C   5 mg at 11/21/21 0959   loratadine (CLARITIN) tablet 10 mg  10 mg Oral Daily Honor Loh M, [place]   10 mg at 11/21/21 0957   metFORMIN (GLUCOPHAGE-XR) 24 hr tablet 1,000 mg  1,000 mg Oral Q breakfast Honor Loh M, PA-C   1,000 mg at 11/21/21 0957   pantoprazole (PROTONIX) EC tablet 40 mg  40 mg Oral Daily Honor Loh M, [place]   40 mg at 11/21/21 1001   risperiDONE (RISPERDAL) tablet 0.5 mg  0.5 mg Oral QHS Honor Loh M, PA-C   0.5 mg at 11/20/21 2201   senna-docusate (Senokot-S) tablet 2 tablet  2 tablet Oral QHS Honor Loh M, [place]   2 tablet at 11/20/21 2200   traZODone (DESYREL) tablet 100 mg  100 mg Oral QHS Honor Loh M, PA-C   100 mg at 11/20/21 2201   Current Outpatient Medications  Medication Sig Dispense Refill   acetaminophen (TYLENOL) 325 MG tablet Take 2 tablets (650 mg total) by mouth every 6 (six) hours as needed for mild pain (or Fever >/= 101). 30 tablet 0   albuterol (VENTOLIN HFA) 108 (90 Base) MCG/ACT inhaler Inhale 1-2 puffs into the lungs every 4 (four) hours as needed for wheezing or shortness of breath.     alfuzosin (UROXATRAL) 10 MG 24 hr tablet Take 1 tablet (10 mg total) by mouth daily with breakfast. 30 tablet 11    Alpha-Lipoic Acid 100 MG CAPS Take 300 mg by mouth daily.     aspirin EC 81 MG tablet Take 1 tablet (81 mg total) by mouth daily. Swallow whole. 30 tablet 0   atorvastatin (LIPITOR) 20 MG tablet Take 1 tablet (20 mg total) by mouth daily. 30 tablet 0   cefdinir (OMNICEF) 300 MG capsule Take 1 capsule (300 mg total) by mouth 2 (two) times daily for 5 days. 10 capsule 0   cetirizine (ZYRTEC) 10 MG tablet Take 1 tablet (10 mg total) by mouth at bedtime. 30 tablet 0   cholecalciferol (VITAMIN D) 25 MCG (1000 UNIT) tablet Take 1 tablet (1,000 Units total) by  mouth daily. 30 tablet 0   citalopram (CELEXA) 20 MG tablet Take 1 tablet (20 mg total) by mouth at bedtime. 30 tablet 0   FEROSUL 325 (65 Fe) MG tablet Take 1 tablet (325 mg total) by mouth 2 (two) times daily. 60 tablet 0   finasteride (PROSCAR) 5 MG tablet Take 5 mg by mouth daily.     fluticasone (FLONASE) 50 MCG/ACT nasal spray Place 1 spray into both nostrils daily. 9.9 mL 0   Ipratropium-Albuterol (COMBIVENT RESPIMAT) 20-100 MCG/ACT AERS respimat Inhale 2 puffs into the lungs 2 (two) times daily. 4 g 0   lamoTRIgine (LAMICTAL) 100 MG tablet Take 1 tablet (100 mg total) by mouth daily. 30 tablet 0   LEVEMIR FLEXTOUCH 100 UNIT/ML FlexTouch Pen Inject 30 Units into the skin at bedtime. (Patient taking differently: Inject 52 Units into the skin at bedtime. If over 400 call MD and hold below 60) 15 mL 0   metFORMIN (GLUCOPHAGE-XR) 500 MG 24 hr tablet Take 2 tablets (1,000 mg total) by mouth daily with breakfast. 60 tablet 0   NOVOLOG FLEXPEN 100 UNIT/ML FlexPen Inject 12 Units into the skin 3 (three) times daily with meals.     nystatin cream (MYCOSTATIN) Apply 1 Application topically 2 (two) times daily.     omeprazole (PRILOSEC) 20 MG capsule Take 1 capsule (20 mg total) by mouth daily. 30 capsule 0   Propylene Glycol (SYSTANE BALANCE) 0.6 % SOLN Apply 1 drop to eye 2 (two) times daily. Both eyes 5 mL 0   risperiDONE (RISPERDAL) 0.5 MG tablet  Take 1 tablet (0.5 mg total) by mouth at bedtime. 30 tablet 0   senna-docusate (SENOKOT-S) 8.6-50 MG tablet Take 2 tablets by mouth at bedtime. 30 tablet 0   sulfamethoxazole-trimethoprim (BACTRIM DS) 800-160 MG tablet Take 1 tablet by mouth 2 (two) times daily.     TRADJENTA 5 MG TABS tablet Take 1 tablet (5 mg total) by mouth daily. 30 tablet 0   traZODone (DESYREL) 100 MG tablet Take 1 tablet (100 mg total) by mouth at bedtime. 30 tablet 0     Discharge Medications: Please see after visit summary for a list of discharge medications.  Relevant Imaging Results:  Relevant Lab Results:   Additional Information SSN: [id number] [place]

## 2021-11-21 NOTE — Plan of Care (Signed)
  Problem: Acute Rehab PT Goals(only PT should resolve) Goal: Pt Will Go Supine/Side To Sit Outcome: Progressing Flowsheets (Taken 11/21/2021 1236) Pt will go Supine/Side to Sit: with moderate assist Goal: Pt Will Go Sit To Supine/Side Outcome: Progressing Flowsheets (Taken 11/21/2021 1236) Pt will go Sit to Supine/Side: with moderate assist Goal: Patient Will Perform Sitting Balance Outcome: Progressing Flowsheets (Taken 11/21/2021 1236) Patient will perform sitting balance:  with modified independence  with supervision Goal: Patient Will Transfer Sit To/From Stand Outcome: Progressing Flowsheets (Taken 11/21/2021 1236) Patient will transfer sit to/from stand: with moderate assist Goal: Pt Will Transfer Bed To Chair/Chair To Bed Outcome: Progressing Flowsheets (Taken 11/21/2021 1236) Pt will Transfer Bed to Chair/Chair to Bed: with mod assist Goal: Pt Will Ambulate Outcome: Progressing Flowsheets (Taken 11/21/2021 1236) Pt will Ambulate:  10 feet  with moderate assist  with rolling walker   12:36 PM, 11/21/21 Ocie Bob, MPT Physical Therapist with [facility] 336 [phone] office [phone] mobile phone

## 2021-11-21 NOTE — Evaluation (Signed)
Physical Therapy Evaluation Patient Details Name: Douglas Cook MRN: [id number] DOB: 10-29-1945 Today's Date: 11/21/2021  History of Present Illness  Douglas Cook is a 76 y.o. male with history of chronic kidney disease, COPD, diabetes, hyperlipidemia who presents to the emergency department today for generalized weakness that started this morning.  Chart review reveals that the patient was just discharged 2 days ago after being admitted for sepsis.  This was secondary to a urinary tract infection.  He was treated with IV antibiotics here and discharged with p.o. Omnicef.  He has not missed any doses.  Patient unable to provide a lot of the history and just states that he feels generally weak and not well.  EMS stated that his blood sugar on their arrival was in the 300s which is abnormal for him.  All the vital signs were normal.     [facility] confirmed that his blood sugar was in the 300s which is abnormal for him.  They state that they know when he has an infection when his blood sugar goes above 300.  They state that it took several nurses to get him into the wheelchair this morning.  They also stated that he felt warm but did not take his temperature.  Ultimately, he was sent to the ED for further evaluation.   Clinical Impression  Patient demonstrates slow labored movement for sitting up at bedside, once seated had to use BUE to support self for maintain fair sitting balance, very unsteady on feet and limited to a few side steps before having to sit due to buckling of knees with legs giving way.  Patient fatigues easily and unable to complete sit to stand after transferring to chair and tolerated sitting up after therapy - RN notified.  Patient will benefit from continued skilled physical therapy in hospital and recommended venue below to increase strength, balance, endurance for safe ADLs and gait.         Recommendations for follow up therapy are one component of a  multi-disciplinary discharge planning process, led by the attending physician.  Recommendations may be updated based on patient status, additional functional criteria and insurance authorization.  Follow Up Recommendations Skilled nursing-short term rehab (<3 hours/day) Can patient physically be transported by private vehicle: No    Assistance Recommended at Discharge Intermittent Supervision/Assistance  Patient can return home with the following  A lot of help with walking and/or transfers;A lot of help with bathing/dressing/bathroom;Assistance with cooking/housework;Help with stairs or ramp for entrance    Equipment Recommendations None recommended by PT  Recommendations for Other Services       Functional Status Assessment Patient has had a recent decline in their functional status and demonstrates the ability to make significant improvements in function in a reasonable and predictable amount of time.     Precautions / Restrictions Precautions Precautions: Fall Restrictions Weight Bearing Restrictions: No      Mobility  Bed Mobility Overal bed mobility: Needs Assistance Bed Mobility: Supine to Sit     Supine to sit: Mod assist, Max assist     General bed mobility comments: increased time, labored movement    Transfers Overall transfer level: Needs assistance Equipment used: Rolling walker (2 wheels), 1 person hand held assist Transfers: Sit to/from Stand, Bed to chair/wheelchair/BSC Sit to Stand: Mod assist, Max assist   Step pivot transfers: Max assist       General transfer comment: unsteady on feet with frequent buckling of knees due to  weakness    Ambulation/Gait Ambulation/Gait assistance: Max assist Gait Distance (Feet): 2 Feet Assistive device: Rolling walker (2 wheels) Gait Pattern/deviations: Decreased step length - left, Decreased stance time - right, Decreased stride length, Knees buckling, Shuffle Gait velocity: slow     General Gait Details:  limited to a few slow labored unsteady side steps with near fall due to legs giving way  Stairs            Wheelchair Mobility    Modified Rankin (Stroke Patients Only)       Balance Overall balance assessment: Needs assistance Sitting-balance support: Feet supported, Bilateral upper extremity supported Sitting balance-Leahy Scale: Fair Sitting balance - Comments: seated at EOB   Standing balance support: During functional activity, Bilateral upper extremity supported, Reliant on assistive device for balance Standing balance-Leahy Scale: Poor Standing balance comment: using RW                             Pertinent Vitals/Pain Pain Assessment Pain Assessment: No/denies pain    Home Living Family/patient expects to be discharged to:: Assisted living                        Prior Function Prior Level of Function : Needs assist       Physical Assist : Mobility (physical);ADLs (physical) Mobility (physical): Bed mobility;Transfers;Gait ADLs (physical): Dressing;Bathing;Grooming Mobility Comments: 2 person assisted for transfer, uses wheelchair for mobility, non-ambulator ADLs Comments: assisted by ALF staff     Hand Dominance   Dominant Hand: Left    Extremity/Trunk Assessment   Upper Extremity Assessment Upper Extremity Assessment: Generalized weakness    Lower Extremity Assessment Lower Extremity Assessment: Generalized weakness    Cervical / Trunk Assessment Cervical / Trunk Assessment: Kyphotic  Communication   Communication: No difficulties  Cognition Arousal/Alertness: Awake/alert Behavior During Therapy: WFL for tasks assessed/performed Overall Cognitive Status: Within Functional Limits for tasks assessed                                          General Comments      Exercises     Assessment/Plan    PT Assessment Patient needs continued PT services  PT Problem List Decreased strength;Decreased  activity tolerance;Decreased balance;Decreased mobility       PT Treatment Interventions DME instruction;Functional mobility training;Therapeutic activities;Therapeutic exercise;Balance training;Patient/family education;Gait training    PT Goals (Current goals can be found in the Care Plan section)  Acute Rehab PT Goals Patient Stated Goal: return home with ALF staff to assist PT Goal Formulation: With patient Time For Goal Achievement: 12/05/21 Potential to Achieve Goals: Good    Frequency Min 2X/week     Co-evaluation               AM-PAC PT "6 Clicks" Mobility  Outcome Measure Help needed turning from your back to your side while in a flat bed without using bedrails?: A Lot Help needed moving from lying on your back to sitting on the side of a flat bed without using bedrails?: A Lot Help needed moving to and from a bed to a chair (including a wheelchair)?: A Lot Help needed standing up from a chair using your arms (e.g., wheelchair or bedside chair)?: A Lot Help needed to walk in hospital room?: Total   6 Click Score: 9  End of Session   Activity Tolerance: Patient tolerated treatment well;Patient limited by fatigue Patient left: in chair;with call bell/phone within reach Nurse Communication: Mobility status PT Visit Diagnosis: Unsteadiness on feet (R26.81);Other abnormalities of gait and mobility (R26.89);Muscle weakness (generalized) (M62.81)    Time: 0900-0930 PT Time Calculation (min) (ACUTE ONLY): 30 min   Charges:   PT Evaluation $PT Eval Moderate Complexity: 1 Mod PT Treatments $Therapeutic Activity: 23-37 mins        12:35 PM, 11/21/21 Ocie Bob, MPT Physical Therapist with [facility] [phone] office [phone] mobile phone

## 2021-11-21 NOTE — Inpatient Diabetes Management (Signed)
Inpatient Diabetes Program Recommendations  AACE/ADA: New Consensus Statement on Inpatient Glycemic Control   Target Ranges:  Prepandial:   less than 140 mg/dL      Peak postprandial:   less than 180 mg/dL (1-2 hours)      Critically ill patients:  140 - 180 mg/dL    Latest Reference Range & Units 11/20/21 09:53 11/20/21 12:20  Glucose-Capillary 70 - 99 mg/dL 060 (H) 045 (H)   Review of Glycemic Control  Diabetes history: DM2 Outpatient Diabetes medications: Lantus 60 units QHS, Novolog 12 units TID with meals, Metformin XR 1000 mg QAM, Tradjenta 5 mg daily Current orders for Inpatient glycemic control: Metformin XR 1000 mg QAM, Tradjenta 5 mg daily, Novolog 12 units TID with meals  Inpatient Diabetes Program Recommendations:    Insulin: Please consider ordering Semglee 20 units Q24H (to start now), CBGs AC&HS,  Novolog 0-15 units TID with meals, Novolog 0-5 units QHS, and decrease meal coverage insulin to Novolog 4 units TID with meals.  Thanks, Orlando Penner, RN, MSN, CDCES Diabetes Coordinator Inpatient Diabetes Program [phone] (Team Pager from 8am to 5pm)

## 2021-11-21 NOTE — ED Notes (Signed)
CSW met with pt to present bed offer from [facility]. Pt states that he is agreeable to this and accepts the bed offer. CSW reached out to pts sister at pt request to provide update. She is also understanding and agreeable to the plan. CSW updated Tammy with [facility] of the plan for D/C to SNF. Tammy states she will get pt some clothes to the facility. CSW awaiting room and report number from [place] with the facility. TOC to follow.

## 2021-11-21 NOTE — ED Notes (Signed)
As per physical therapist recommendation, staff used a lift to get pt from recliner back into the bed. Pt tolerated lift well.

## 2021-11-21 NOTE — ED Provider Notes (Signed)
Emergency Medicine Observation Re-evaluation Note  Douglas Cook is a 76 y.o. male, seen on rounds today.  Pt initially presented to the ED for complaints of Weakness Currently, the patient is sleepin.  Physical Exam  BP (!) 136/113   Pulse 92   Temp 98.3 F (36.8 C) (Oral)   Resp 15   SpO2 96%  Physical Exam General: asleep Cardiac: asleep Lungs: normal effort Psych: asleep  ED Course / MDM  EKG:   I have reviewed the labs performed to date as well as medications administered while in observation.  Recent changes in the last 24 hours include diabetic medication changes.  Plan  Current plan is for discharge to SNF.    Pricilla Loveless, MD 11/21/21 1515

## 2021-11-21 NOTE — ED Notes (Signed)
Transition of Care [facility]) - Emergency Department Mini Assessment   Patient Details  Name: Douglas Cook MRN: [id number] Date of Birth: February 27, 1946  [facility]) CM/SW Contact:    Villa Herb, LCSWA Phone Number: 11/21/2021, 3:10 PM   Clinical Narrative: CSW spoke to [place] with the facility who states that facility is ready to accept pt. CSW updated MD and RN of report and room numbers. Fl2 and AVS have been updated for facility. CSW updated RN that once report had been called, the ED can call for EMS transport. TOC signing off.    ED Mini Assessment: What brought you to the Emergency Department? : weakness and AMS  Barriers to Discharge: Barriers Resolved  Barrier interventions: SNF placement  Means of departure: Ambulance  Interventions which prevented an admission or readmission: SNF Placement    Patient Contact and Communications        ,          Patient states their goals for this hospitalization and ongoing recovery are:: go to SNF CMS Medicare.gov Compare Post Acute Care list provided to:: Patient Choice offered to / list presented to : Patient, Sibling  Admission diagnosis:  ems Patient Active Problem List   Diagnosis Date Noted   Cellulitis 11/17/2021   Uncontrolled type 2 diabetes mellitus with hyperglycemia, with long-term current use of insulin (HCC) 11/16/2021   Urinary tract infection 09/18/2021   Hyperglycemia 09/17/2021   Urinary retention 08/10/2021   History of UTI 08/10/2021   Acute metabolic encephalopathy 06/18/2021   Sepsis secondary to UTI (HCC) 05/09/2021   Major neurocognitive disorder (HCC) 02/10/2021   HTN (hypertension) 02/08/2021   BPH without obstruction/lower urinary tract symptoms 02/08/2021   Anemia due to stage 3b chronic kidney disease (HCC) 02/08/2021   CKD stage 3 due to type 2 diabetes mellitus (HCC) 02/08/2021   Chronic constipation 02/08/2021   Major depression, recurrent, chronic (HCC) 02/08/2021    UTI (urinary tract infection) 02/08/2021   Closed fracture of neck of left femur (HCC)    Hip fracture (HCC) 02/01/2021   Acute kidney injury superimposed on CKD (HCC) 07/04/2017   COPD (chronic obstructive pulmonary disease) (HCC) 07/04/2017   Controlled type 2 diabetes mellitus without complication, without long-term current use of insulin (HCC) 07/04/2017   GERD (gastroesophageal reflux disease) 07/04/2017   Mixed hyperlipidemia 07/04/2017   Depression 07/04/2017   PCP:  Ignatius Specking, MD Pharmacy:   [facility] [place] - [address] [place] [address] Phone: [phone] Fax: [phone]

## 2021-11-22 ENCOUNTER — Other Ambulatory Visit: Payer: Medicare Other | Admitting: Urology

## 2021-11-22 NOTE — Progress Notes (Deleted)
Assessment: 1. History of UTI   2. BPH with obstruction/lower urinary tract symptoms   3. History of urinary retention      Plan: I reviewed his records from his recent hospital visits including urine culture results. He is currently completing a course of Bactrim DS. Continue alfuzosin 10 mg p.o. daily. Recommend beginning fosfomycin 3 g PO every 10 days for UTI prevention Return to office in 3-4 weeks for cystoscopy   Chief Complaint:  No chief complaint on file.   History of Present Illness:  Douglas Cook is a 76 y.o. year old male who is seen for further evaluation of urinary retention and history of UTIs.  He was diagnosed with a UTI in February 2023 and again in March 2023.  Urine cultures from 2/23 and 3/23 grew E. coli.  He was treated with appropriate antibiotics.  He had a Foley catheter placed in February 2023 with return of 600 mL.  The Foley catheter was removed prior to discharge from the hospital.  He presented to the emergency room on 07/31/2021 with urinary frequency and dysuria.  Urinalysis showed 0-5 RBCs, 6-10 WBCs, and no bacteria. Bladder scan revealed a volume of 347 mL.  A Foley catheter was placed with return of 600 mL of urine.  He had been on tamsulosin since 04/25/2021. His Foley was removed on 08/14/2021.  Unfortunately, he was unable to void and a catheter was reinserted later that day. He was seen in the emergency room on 08/15/2021 with discomfort associated with the Foley catheter.  He was started on cefdinir. Urine culture showed no growth. At his visit on 08/17/21, his foley was draining well.  He was changed to alfuzosin. His foley was removed on 08/23/21.    At his visit in June 2023, he was voiding spontaneously.  He felt like he was emptying his bladder well.  He continued on alfuzosin.  No dysuria or gross hematuria. IPSS = 1.  PVR = 77 ml.  He was diagnosed and treated for UTI's with associated symptoms including fever, generalized malaise,  and dysuria. Urine culture results: 09/17/2021 >100 K. Proteus 10/19/2021 30K Proteus  He was treated with Bactrim DS with improvement in his symptoms. He was admitted to the hospital on 11/15/2021 with fever, malaise, and dysuria. Urine culture grew >100 K lactobacillus and 40 K. Proteus.  No sensitivities available.  He was treated with IV Rocephin and discharged on Omnicef. He presented to the emergency room again on 11/20/2021 with generalized malaise and weakness.  Urine culture showed <10K colonies. CT renal stone study from 11/06/2021 showed no renal or ureteral calculi, no renal mass, and no evidence of obstruction.  He is currently a patient at [facility].  Portions of the above documentation were copied from a prior visit for review purposes only.   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)    Arthritis    CKD (chronic kidney disease), stage III (HCC)    COPD (chronic obstructive pulmonary disease) (HCC)    Depression    Diabetes mellitus without complication (HCC)    Fatigue    GERD (gastroesophageal reflux disease)    Hyperlipidemia    Osteoarthritis    Reflux     Past Surgical History:  Past Surgical History:  Procedure Laterality Date   HIP ARTHROPLASTY Left 02/03/2021   Procedure: ARTHROPLASTY BIPOLAR HIP (HEMIARTHROPLASTY);  Surgeon: Vickki Hearing, MD;  Location: [facility];  Service: Orthopedics;  Laterality: Left;    Allergies:  No Known Allergies  Family History:  Family History  Problem Relation Age of [facility]   Stroke Mother    Diabetes Father     Social History:  Social History   Tobacco Use   Smoking status: Former    Types: Pipe   Smokeless tobacco: Never  Building services engineer Use: Never used  Substance Use Topics   Alcohol use: No   Drug use: No    ROS: Constitutional:  Negative for fever, chills, weight loss CV: Negative for chest pain, previous MI,  hypertension Respiratory:  Negative for shortness of breath, wheezing, sleep apnea, frequent cough GI:  Negative for nausea, vomiting, bloody stool, GERD  Physical exam: There were no vitals taken for this visit. GENERAL APPEARANCE:  Well appearing, well developed, well nourished, NAD HEENT:  Atraumatic, normocephalic, oropharynx clear NECK:  Supple without lymphadenopathy or thyromegaly ABDOMEN:  Soft, non-tender, no masses EXTREMITIES:  Moves all extremities well, without clubbing, cyanosis, or edema NEUROLOGIC:  Alert and oriented x 3, normal gait, CN II-XII grossly intact MENTAL STATUS:  appropriate BACK:  Non-tender to palpation, No CVAT SKIN:  Warm, dry, and intact  Results: U/A:

## 2021-11-23 LAB — URINE CULTURE: Culture: >=100000 — AB

## 2021-11-28 ENCOUNTER — Ambulatory Visit (INDEPENDENT_AMBULATORY_CARE_PROVIDER_SITE_OTHER): Payer: Medicare Other | Admitting: Urology

## 2021-11-28 ENCOUNTER — Encounter: Payer: Self-pay | Admitting: Urology

## 2021-11-28 VITALS — BP 95/64 | HR 106 | Ht 72.0 in | Wt 215.0 lb

## 2021-11-28 DIAGNOSIS — N401 Enlarged prostate with lower urinary tract symptoms: Secondary | ICD-10-CM | POA: Diagnosis not present

## 2021-11-28 DIAGNOSIS — R3129 Other microscopic hematuria: Secondary | ICD-10-CM | POA: Diagnosis not present

## 2021-11-28 DIAGNOSIS — Z87898 Personal history of other specified conditions: Secondary | ICD-10-CM

## 2021-11-28 DIAGNOSIS — R339 Retention of urine, unspecified: Secondary | ICD-10-CM | POA: Diagnosis not present

## 2021-11-28 DIAGNOSIS — Z87440 Personal history of urinary (tract) infections: Secondary | ICD-10-CM | POA: Diagnosis not present

## 2021-11-28 DIAGNOSIS — N138 Other obstructive and reflux uropathy: Secondary | ICD-10-CM

## 2021-11-28 NOTE — Progress Notes (Signed)
Assessment: 1. History of UTI   2. BPH with obstruction/lower urinary tract symptoms   3. Microscopic hematuria   4. History of urinary retention     Plan: I reviewed his records from his recent hospital visits including urine culture results. His last urine culture showed no significant growth. I reviewed the CT study from 11/06/2021 with results as noted below. Continue alfuzosin 10 mg p.o. daily. Unfortunately, office cystoscopy is not hospital as a Hoyer lift is not available and the patient requires total transfer assistance. Will reschedule for possible cystoscopy if he is able to be transported in a reclining wheelchair or if he regains enough strength to assist with transfer. Return to office in 1 month.  Please transport in reclining wheelchair.  Chief Complaint:  Chief Complaint  Patient presents with   Cysto    History of Present Illness:  Douglas Cook is a 76 y.o. year old male who is seen for further evaluation of urinary retention and history of UTIs.  He was diagnosed with a UTI in February 2023 and again in March 2023.  Urine cultures from 2/23 and 3/23 grew E. coli.  He was treated with appropriate antibiotics.  He had a Foley catheter placed in February 2023 with return of 600 mL.  The Foley catheter was removed prior to discharge from the hospital.  He presented to the emergency room on 07/31/2021 with urinary frequency and dysuria.  Urinalysis showed 0-5 RBCs, 6-10 WBCs, and no bacteria. Bladder scan revealed a volume of 347 mL.  A Foley catheter was placed with return of 600 mL of urine.  He had been on tamsulosin since 04/25/2021. His Foley was removed on 08/14/2021.  Unfortunately, he was unable to void and a catheter was reinserted later that day. He was seen in the emergency room on 08/15/2021 with discomfort associated with the Foley catheter.  He was started on cefdinir. Urine culture showed no growth. At his visit on 08/17/21, his foley was draining well.   He was changed to alfuzosin. His foley was removed on 08/23/21.    At his visit in June 2023, he was voiding spontaneously.  He felt like he was emptying his bladder well.  He continued on alfuzosin.  No dysuria or gross hematuria. IPSS = 1.  PVR = 77 ml.  He was diagnosed and treated for UTI's with associated symptoms including fever, generalized malaise, and dysuria. Urine culture results: 09/17/2021 >100 K. Proteus 10/19/2021 30K Proteus 11/15/21 >100K Lactobacillus, 40K Proteus 11/20/21 <10K colonies  He was treated with Bactrim DS with improvement in his symptoms. He was admitted to the hospital on 11/15/2021 with fever, malaise, and dysuria. Urine culture grew >100 K lactobacillus and 40 K. Proteus.  No sensitivities available.  He was treated with IV Rocephin and discharged on Omnicef. He presented to the emergency room again on 11/20/2021 with generalized malaise and weakness.  Urine culture showed <10K colonies.  He was changed to Bactrim DS.    CT renal stone study from 11/06/2021 showed no renal or ureteral calculi, no renal mass, and no evidence of obstruction.  He presents today for follow-up.  He is currently undergoing rehab at [facility].  He is unable to transfer on his own and requires a Hoyer lift for transfers.  He is unable to provide much history today.  He repeats that he is "getting worse".  He is unable to provide any specifics regarding urinary symptoms.  Review of his chart  indicates that he is on alfuzosin.  He is not currently on any antibiotic.  Portions of the above documentation were copied from a prior visit for review purposes only.   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)    Arthritis    CKD (chronic kidney disease), stage III (HCC)    COPD (chronic obstructive pulmonary disease) (HCC)    Depression    Diabetes mellitus without complication (HCC)     Fatigue    GERD (gastroesophageal reflux disease)    Hyperlipidemia    Osteoarthritis    Reflux     Past Surgical History:  Past Surgical History:  Procedure Laterality Date   HIP ARTHROPLASTY Left 02/03/2021   Procedure: ARTHROPLASTY BIPOLAR HIP (HEMIARTHROPLASTY);  Surgeon: Vickki Hearing, MD;  Location: [facility];  Service: Orthopedics;  Laterality: Left;    Allergies:  No Known Allergies  Family History:  Family History  Problem Relation Age of [facility]   Stroke Mother    Diabetes Father     Social History:  Social History   Tobacco Use   Smoking status: Former    Types: Pipe   Smokeless tobacco: Never  Building services engineer Use: Never used  Substance Use Topics   Alcohol use: No   Drug use: No    ROS: Constitutional:  Negative for fever, chills, weight loss CV: Negative for chest pain, previous MI, hypertension Respiratory:  Negative for shortness of breath, wheezing, sleep apnea, frequent cough GI:  Negative for nausea, vomiting, bloody stool, GERD  Physical exam: BP 95/64   Pulse (!) 106   Ht 6' (1.829 m)   Wt 215 lb (97.5 kg)   BMI 29.16 kg/m  GENERAL APPEARANCE:  Chronically ill appearing male, NAD HEENT:  Atraumatic, normocephalic, oropharynx clear NECK:  Supple  ABDOMEN:  Soft, non-tender, no masses EXTREMITIES:  Without clubbing, cyanosis, or edema NEUROLOGIC:  Alert, CN II-XII grossly intact BACK:  Non-tender to palpation, No CVAT SKIN:  Warm, dry, and intact  Results: No specimen provided

## 2022-01-03 ENCOUNTER — Encounter: Payer: Self-pay | Admitting: Urology

## 2022-01-03 ENCOUNTER — Ambulatory Visit (INDEPENDENT_AMBULATORY_CARE_PROVIDER_SITE_OTHER): Payer: Medicare Other | Admitting: Urology

## 2022-01-03 VITALS — BP 91/61 | HR 139 | Ht 72.0 in | Wt 215.0 lb

## 2022-01-03 DIAGNOSIS — N138 Other obstructive and reflux uropathy: Secondary | ICD-10-CM

## 2022-01-03 DIAGNOSIS — N401 Enlarged prostate with lower urinary tract symptoms: Secondary | ICD-10-CM

## 2022-01-03 DIAGNOSIS — Z87440 Personal history of urinary (tract) infections: Secondary | ICD-10-CM | POA: Diagnosis not present

## 2022-01-03 DIAGNOSIS — R339 Retention of urine, unspecified: Secondary | ICD-10-CM | POA: Diagnosis not present

## 2022-01-03 DIAGNOSIS — Z87898 Personal history of other specified conditions: Secondary | ICD-10-CM

## 2022-01-03 MED ORDER — CIPROFLOXACIN HCL 500 MG PO TABS
500.0000 mg | ORAL_TABLET | Freq: Once | ORAL | Status: AC
  Administered 2022-01-03: 500 mg via ORAL

## 2022-01-03 NOTE — Addendum Note (Signed)
Addended by: Audie Box on: 01/03/2022 02:03 PM   Modules accepted: Orders

## 2022-01-03 NOTE — Progress Notes (Signed)
Assessment: 1. History of UTI   2. BPH with obstruction/lower urinary tract symptoms   3. History of urinary retention    Plan: No evidence of UTI today Continue alfuzosin 10 mg p.o. daily. Return to office in 6-8 weeks  Chief Complaint:  Chief Complaint  Patient presents with   Cysto   History of Present Illness:  Douglas Cook is a 76 y.o. year old male who is seen for further evaluation of urinary retention and history of UTIs.  He was diagnosed with a UTI in February 2023 and again in March 2023.  Urine cultures from 2/23 and 3/23 grew E. coli.  He was treated with appropriate antibiotics.  He had a Foley catheter placed in February 2023 with return of 600 mL.  The Foley catheter was removed prior to discharge from the hospital.  He presented to the emergency room on 07/31/2021 with urinary frequency and dysuria.  Urinalysis showed 0-5 RBCs, 6-10 WBCs, and no bacteria. Bladder scan revealed a volume of 347 mL.  A Foley catheter was placed with return of 600 mL of urine.  He had been on tamsulosin since 04/25/2021. His Foley was removed on 08/14/2021.  Unfortunately, he was unable to void and a catheter was reinserted later that day. He was seen in the emergency room on 08/15/2021 with discomfort associated with the Foley catheter.  He was started on cefdinir. Urine culture showed no growth. At his visit on 08/17/21, his foley was draining well.  He was changed to alfuzosin. His foley was removed on 08/23/21.    At his visit in June 2023, he was voiding spontaneously.  He felt like he was emptying his bladder well.  He continued on alfuzosin.  No dysuria or gross hematuria. IPSS = 1.  PVR = 77 ml.  He was diagnosed and treated for UTI's with associated symptoms including fever, generalized malaise, and dysuria. Urine culture results: 09/17/2021 >100 K. Proteus 10/19/2021 30K Proteus 11/15/21 >100K Lactobacillus, 40K Proteus 11/20/21 <10K colonies  He was treated with Bactrim DS  with improvement in his symptoms. He was admitted to the hospital on 11/15/2021 with fever, malaise, and dysuria. Urine culture grew >100 K lactobacillus and 40 K. Proteus.  No sensitivities available.  He was treated with IV Rocephin and discharged on Omnicef. He presented to the emergency room again on 11/20/2021 with generalized malaise and weakness.  Urine culture showed <10K colonies.  He was changed to Bactrim DS.    CT renal stone study from 11/06/2021 showed no renal or ureteral calculi, no renal mass, and no evidence of obstruction. He was recently admitted to rehab at [facility].  He is unable to transfer on his own and requires a Hoyer lift for transfers.   He was seen in the emergency room in [place] for possible UTI on 12/05/2021.  Urinalysis showed 5 RBCs, 2 WBCs, and few bacteria.  No culture performed.  He presents today for cystoscopy.  He continues on alfuzosin.  It appears that he has also been placed on finasteride.  He reports no problems with urination.  No dysuria or gross hematuria.  Portions of the above documentation were copied from a prior visit for review purposes only.   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)    Arthritis    CKD (chronic kidney disease), stage III (HCC)    COPD (chronic obstructive pulmonary disease) (  [place])    Depression    Diabetes mellitus without complication ([place])    Fatigue    GERD (gastroesophageal reflux disease)    Hyperlipidemia    Osteoarthritis    Reflux     Past Surgical History:  Past Surgical History:  Procedure Laterality Date   HIP ARTHROPLASTY Left 02/03/2021   Procedure: ARTHROPLASTY BIPOLAR HIP (HEMIARTHROPLASTY);  Surgeon: Carole Civil, MD;  Location: [facility];  Service: Orthopedics;  Laterality: Left;    Allergies:  No Known Allergies  Family History:  Family History  Problem Relation Age of Onset    Stroke Mother    Diabetes Father     Social History:  Social History   Tobacco Use   Smoking status: Former    Types: Pipe   Smokeless tobacco: Never  Scientific laboratory technician Use: Never used  Substance Use Topics   Alcohol use: No   Drug use: No    ROS: Constitutional:  Negative for fever, chills, weight loss CV: Negative for chest pain, previous MI, hypertension Respiratory:  Negative for shortness of breath, wheezing, sleep apnea, frequent cough GI:  Negative for nausea, vomiting, bloody stool, GERD  Physical exam: BP 91/61   Pulse (!) 139   Ht 6' (1.829 m)   Wt 215 lb (97.5 kg)   BMI 29.16 kg/m  GENERAL APPEARANCE:  Well appearing, well developed, well nourished, NAD HEENT:  Atraumatic, normocephalic, oropharynx clear NECK:  Supple without lymphadenopathy or thyromegaly ABDOMEN:  Soft, non-tender, no masses EXTREMITIES:  Without clubbing, cyanosis, or edema NEUROLOGIC:  Alert and oriented x 3, in wheelchair, CN II-XII grossly intact MENTAL STATUS:  appropriate BACK:  Non-tender to palpation, No CVAT SKIN:  Warm, dry, and intact  Results: U/A: 11-30 WBC, 0-2 RBC, few bacteria  Procedure:  Flexible Cystourethroscopy  Pre-operative Diagnosis: UTI  Post-operative Diagnosis: UTI  Anesthesia:  local with lidocaine jelly  Surgical Narrative:  After appropriate informed consent was obtained, the patient was prepped and draped in the usual sterile fashion in the supine position.  The patient was correctly identified and the proper procedure delineated prior to proceeding.  Sterile lidocaine gel was instilled in the urethra. The flexible cystoscope was introduced without difficulty.  Findings:  Anterior urethra: Normal  Posterior urethra:  mild lateral lobe enlargement of prostate without significant obstruction  Bladder:  debris at bladder base; no mucosal lesions  Ureteral orifices: normal  Additional findings: none  Saline bladder wash for cytology was  not performed.    The cystoscope was then removed.  The patient tolerated the procedure well.

## 2022-01-04 LAB — URINALYSIS, ROUTINE W REFLEX MICROSCOPIC
Bilirubin, UA: NEGATIVE
Ketones, UA: NEGATIVE
Nitrite, UA: NEGATIVE
Protein,UA: NEGATIVE
Specific Gravity, UA: 1.005 (ref 1.005–1.030)
Urobilinogen, Ur: 0.2 mg/dL (ref 0.2–1.0)
pH, UA: 5 (ref 5.0–7.5)

## 2022-01-04 LAB — MICROSCOPIC EXAMINATION

## 2022-03-01 ENCOUNTER — Ambulatory Visit: Payer: Medicare Other | Admitting: Urology

## 2022-03-05 ENCOUNTER — Encounter: Payer: Self-pay | Admitting: Urology

## 2022-03-05 ENCOUNTER — Ambulatory Visit (INDEPENDENT_AMBULATORY_CARE_PROVIDER_SITE_OTHER): Payer: Medicare Other | Admitting: Urology

## 2022-03-05 VITALS — BP 100/64 | HR 106

## 2022-03-05 DIAGNOSIS — Z87440 Personal history of urinary (tract) infections: Secondary | ICD-10-CM | POA: Diagnosis not present

## 2022-03-05 DIAGNOSIS — R339 Retention of urine, unspecified: Secondary | ICD-10-CM

## 2022-03-05 DIAGNOSIS — N401 Enlarged prostate with lower urinary tract symptoms: Secondary | ICD-10-CM | POA: Diagnosis not present

## 2022-03-05 DIAGNOSIS — N138 Other obstructive and reflux uropathy: Secondary | ICD-10-CM | POA: Diagnosis not present

## 2022-03-05 MED ORDER — FINASTERIDE 5 MG PO TABS: 5.0000 mg | ORAL_TABLET | Freq: Every day | ORAL | 3 refills | Status: AC

## 2022-03-05 MED ORDER — ALFUZOSIN HCL ER 10 MG PO TB24: 10.0000 mg | ORAL_TABLET | Freq: Every day | ORAL | 11 refills | Status: AC

## 2022-03-05 NOTE — Patient Instructions (Signed)

## 2022-03-05 NOTE — Progress Notes (Signed)
03/05/2022 2:16 PM   Douglas Cook [birth year] [id number]  Referring provider: Ignatius Specking, MD [address] [place],  [place] [address]  Followup urinary retention and UTI   HPI: Douglas Cook is a 76yo here for followup for urinary retention. Nocturia 2x. He has intermittent dysuria. No hematuria. No strainign to urinate. No UTI since last visit. 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)    Arthritis    CKD (chronic kidney disease), stage III (HCC)    COPD (chronic obstructive pulmonary disease) (HCC)    Depression    Diabetes mellitus without complication (HCC)    Fatigue    GERD (gastroesophageal reflux disease)    Hyperlipidemia    Osteoarthritis    Reflux     Surgical History: Past Surgical History:  Procedure Laterality Date   HIP ARTHROPLASTY Left 02/03/2021   Procedure: ARTHROPLASTY BIPOLAR HIP (HEMIARTHROPLASTY);  Surgeon: , MD;  Location: [facility];  Service: Orthopedics;  Laterality: Left;    Home Medications:  Allergies as of 03/05/2022   No Known Allergies      Medication List        Accurate as of March 05, 2022  2:16 PM. If you have any questions, ask your nurse or doctor.          acetaminophen 325 MG tablet Commonly known as: TYLENOL Take 2 tablets (650 mg total) by mouth every 6 (six) hours as needed for mild pain (or Fever >/= 101).   albuterol 108 (90 Base) MCG/ACT inhaler Commonly known as: VENTOLIN HFA Inhale 1-2 puffs into the lungs every 4 (four) hours as needed for wheezing or shortness of breath.   alfuzosin 10 MG 24 hr tablet Commonly known as: UROXATRAL Take 1 tablet (10 mg total) by mouth daily with breakfast.   Alpha-Lipoic Acid 100 MG Caps Take 300 mg by mouth daily.   aspirin EC 81 MG tablet Take 1 tablet (81 mg total) by mouth daily. Swallow whole.   atorvastatin 20 MG  tablet Commonly known as: LIPITOR Take 1 tablet (20 mg total) by mouth daily.   cetirizine 10 MG tablet Commonly known as: ZYRTEC Take 1 tablet (10 mg total) by mouth at bedtime.   cholecalciferol 25 MCG (1000 UNIT) tablet Commonly known as: VITAMIN D3 Take 1 tablet (1,000 Units total) by mouth daily.   citalopram 20 MG tablet Commonly known as: CELEXA Take 1 tablet (20 mg total) by mouth at bedtime.   Combivent Respimat 20-100 MCG/ACT Aers respimat Generic drug: Ipratropium-Albuterol Inhale 2 puffs into the lungs 2 (two) times daily.   FeroSul 325 (65 FE) MG tablet Generic drug: ferrous sulfate Take 1 tablet (325 mg total) by mouth 2 (two) times daily.   finasteride 5 MG tablet Commonly known as: PROSCAR Take 5 mg by mouth daily.   fluticasone 50 MCG/ACT nasal spray Commonly known as: FLONASE Place 1 spray into both nostrils daily.   lamoTRIgine 100 MG tablet Commonly known as: LAMICTAL Take 1 tablet (100 mg total) by mouth daily.   Levemir FlexTouch 100 UNIT/ML FlexPen Generic drug: insulin detemir Inject 30 Units into the skin at bedtime. What changed:  how much to take additional instructions   metFORMIN 500 MG 24 hr tablet Commonly known as: GLUCOPHAGE-XR Take 2 tablets (1,000 mg total) by mouth daily with breakfast.   NovoLOG FlexPen 100 UNIT/ML  FlexPen Generic drug: insulin aspart Inject 12 Units into the skin 3 (three) times daily with meals.   nystatin cream Commonly known as: MYCOSTATIN Apply 1 Application topically 2 (two) times daily.   omeprazole 20 MG capsule Commonly known as: PRILOSEC Take 1 capsule (20 mg total) by mouth daily.   risperiDONE 0.5 MG tablet Commonly known as: RISPERDAL Take 1 tablet (0.5 mg total) by mouth at bedtime.   senna-docusate 8.6-50 MG tablet Commonly known as: Senokot-S Take 2 tablets by mouth at bedtime.   sulfamethoxazole-trimethoprim 800-160 MG tablet Commonly known as: BACTRIM DS Take 1 tablet by mouth  2 (two) times daily.   Systane Balance 0.6 % Soln Generic drug: Propylene Glycol Apply 1 drop to eye 2 (two) times daily. Both eyes   Tradjenta 5 MG Tabs tablet Generic drug: linagliptin Take 1 tablet (5 mg total) by mouth daily.   traZODone 100 MG tablet Commonly known as: DESYREL Take 1 tablet (100 mg total) by mouth at bedtime.        Allergies: No Known Allergies  Family History: Family History  Problem Relation Age of Onset   Stroke Mother    Diabetes Father     Social History:  reports that he has quit smoking. His smoking use included pipe. He has never used smokeless tobacco. He reports that he does not drink alcohol and does not use drugs.  ROS: All other review of systems were reviewed and are negative except what is noted above in HPI  Physical Exam: BP 100/64   Pulse (!) 106   Constitutional:  Alert and oriented, No acute distress. HEENT: [place] AT, moist mucus membranes.  Trachea midline, no masses. Cardiovascular: No clubbing, cyanosis, or edema. Respiratory: Normal respiratory effort, no increased work of breathing. GI: Abdomen is soft, nontender, nondistended, no abdominal masses GU: No CVA tenderness.  Lymph: No cervical or inguinal lymphadenopathy. Skin: No rashes, bruises or suspicious lesions. Neurologic: Grossly intact, no focal deficits, moving all 4 extremities. Psychiatric: Normal mood and affect.  Laboratory Data: Lab Results  Component Value Date   WBC 9.5 11/20/2021   HGB 10.9 (L) 11/20/2021   HCT 33.0 (L) 11/20/2021   MCV 89.9 11/20/2021   PLT 230 11/20/2021    Lab Results  Component Value Date   CREATININE 1.79 (H) 11/20/2021    No results found for: "PSA"  No results found for: "TESTOSTERONE"  Lab Results  Component Value Date   HGBA1C 7.0 (H) 06/19/2021    Urinalysis    Component Value Date/Time   COLORURINE YELLOW 11/20/2021 1351   APPEARANCEUR Hazy (A) 01/03/2022 1405   LABSPEC 1.000 (L) 11/20/2021 1351   PHURINE  5.0 11/20/2021 1351   GLUCOSEU Trace (A) 01/03/2022 1405   HGBUR NEGATIVE 11/20/2021 1351   BILIRUBINUR Negative 01/03/2022 1405   KETONESUR NEGATIVE 11/20/2021 1351   PROTEINUR Negative 01/03/2022 1405   PROTEINUR NEGATIVE 11/20/2021 1351   NITRITE Negative 01/03/2022 1405   NITRITE NEGATIVE 11/20/2021 1351   LEUKOCYTESUR 1+ (A) 01/03/2022 1405   LEUKOCYTESUR NEGATIVE 11/20/2021 1351    Lab Results  Component Value Date   LABMICR See below: 01/03/2022   WBCUA 11-30 (A) 01/03/2022   LABEPIT 0-10 01/03/2022   BACTERIA Few 01/03/2022    Pertinent Imaging:  No results found for this or any previous visit.  No results found for this or any previous visit.  No results found for this or any previous visit.  No results found for this or any previous visit.  No results found for this or any previous visit.  No valid procedures specified. No results found for this or any previous visit.  Results for orders placed during the hospital encounter of 11/06/21  CT RENAL STONE STUDY  Narrative CLINICAL DATA:  Microscopic hematuria.  EXAM: CT ABDOMEN AND PELVIS WITHOUT CONTRAST  TECHNIQUE: Multidetector CT imaging of the abdomen and pelvis was performed following the standard protocol without IV contrast.  RADIATION DOSE REDUCTION: This exam was performed according to the departmental dose-optimization program which includes automated exposure control, adjustment of the mA and/or kV according to patient size and/or use of iterative reconstruction technique.  COMPARISON:  None Available.  FINDINGS: Lower chest: No acute abnormality.  Hepatobiliary: There is no suspicious liver abnormality. Status post cholecystectomy. Pneumobilia identified which may be the sequelae of prior sphincterotomy.  Pancreas: Unremarkable. No pancreatic ductal dilatation or surrounding inflammatory changes.  Spleen: Normal in size without focal abnormality.  Adrenals/Urinary Tract: Normal  adrenal glands. No nephrolithiasis, hydronephrosis or kidney mass. No hydroureter or ureteral lithiasis. Urinary bladder is unremarkable.  Stomach/Bowel: Small hiatal hernia. Stomach is otherwise within normal limits. The appendix is visualized and appears normal. No bowel wall thickening, inflammation, or distension. Moderate retained stool identified within the colon and rectum.  Vascular/Lymphatic: Aortic atherosclerosis. No signs of abdominopelvic adenopathy.  Reproductive: Prostate is unremarkable.  Other: No free fluid or fluid collections. No signs of pneumoperitoneum.  Musculoskeletal: Status post left hip arthroplasty. No acute or suspicious osseous findings.  IMPRESSION: 1. No acute findings within the abdomen or pelvis. 2. No nephrolithiasis or hydronephrosis. 3. Moderate retained stool within the colon and rectum. 4. Aortic Atherosclerosis (ICD10-I70.0).   Electronically Signed By: Signa Kell M.D. On: 11/06/2021 19:20   Assessment & Plan:    1. BPH with obstruction/lower urinary tract symptoms -Continue uroxatral 10mg  qhs and finasteride 5mg    2. Urinary retention -improved on uroxatral and fiansteride  3. History of UTI -resolved   No follow-ups on file.  , MD  [facility] [place]

## 2022-09-03 ENCOUNTER — Ambulatory Visit: Payer: Medicare Other | Admitting: Urology

## 2023-08-20 IMAGING — CT CT HEAD W/O CM
3 series · 15 of 47 positions shown, 18 images · non-contrast
Comparison: 02/01/2021

CLINICAL DATA: Altered mental status, weakness and somnolence.



[Series 2: head w o · axial · 0.41mm/px · z∈[+6,+131]mm · 9 of 31 slices shown, 12 images]
[im 3/31  brain]
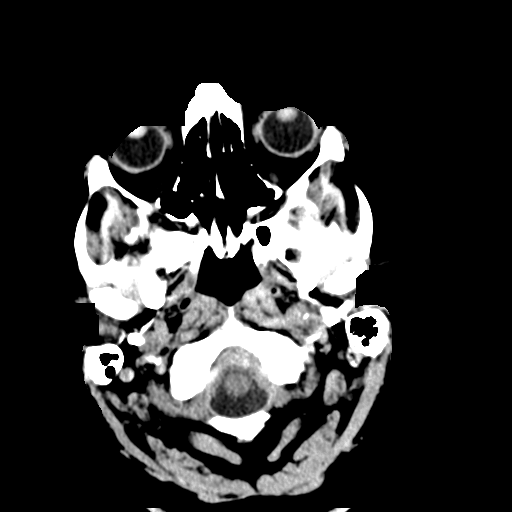
[im 3/31  bone]
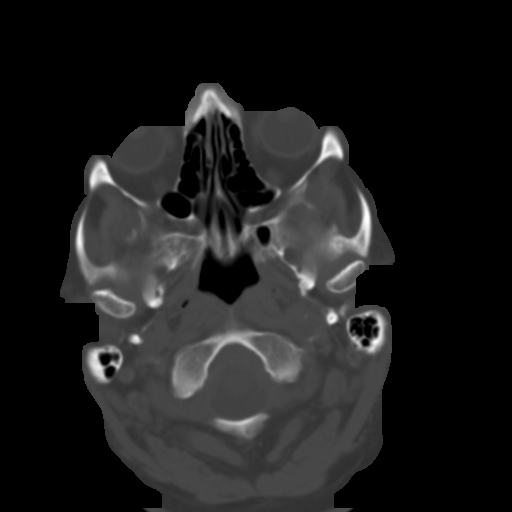
[im 6/31  brain]
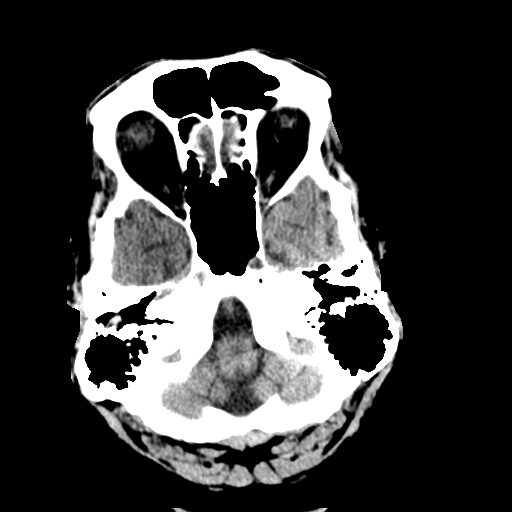
[im 9/31  brain]
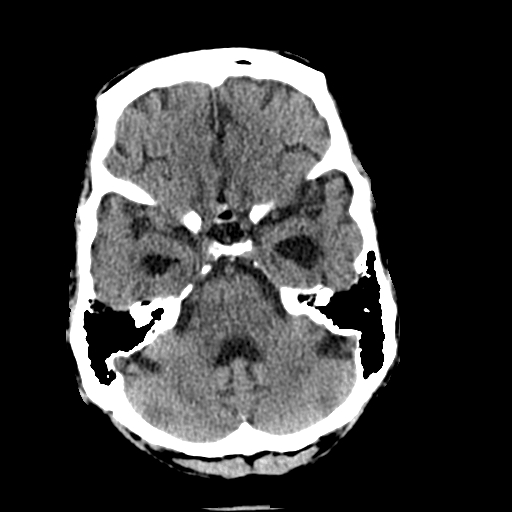
[im 12/31  brain]
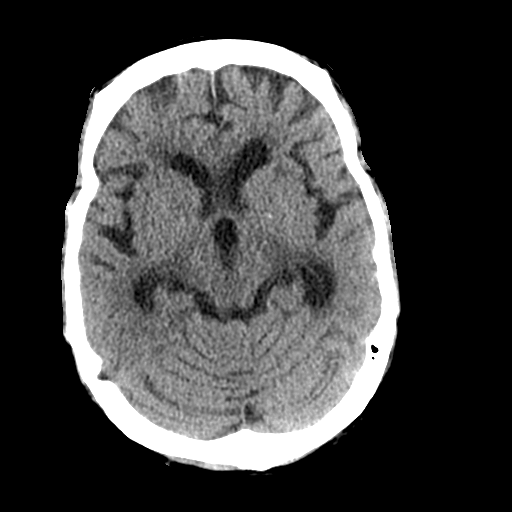
[im 16/31  brain]
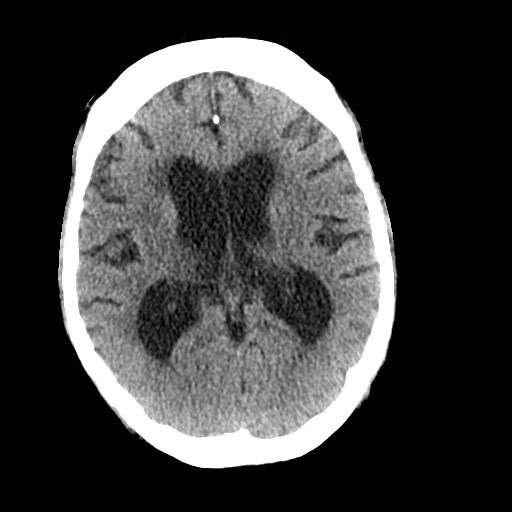
[im 16/31  bone]
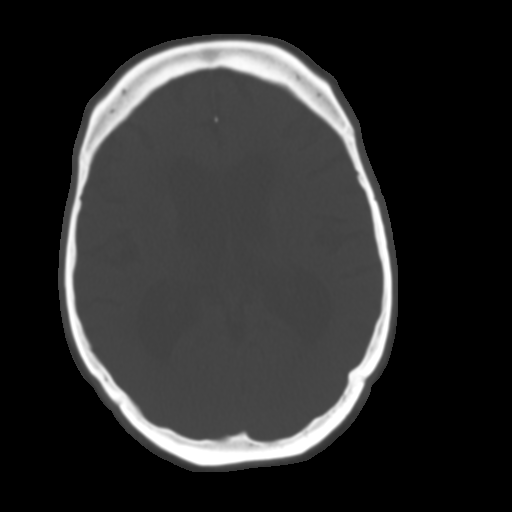
[im 19/31  brain]
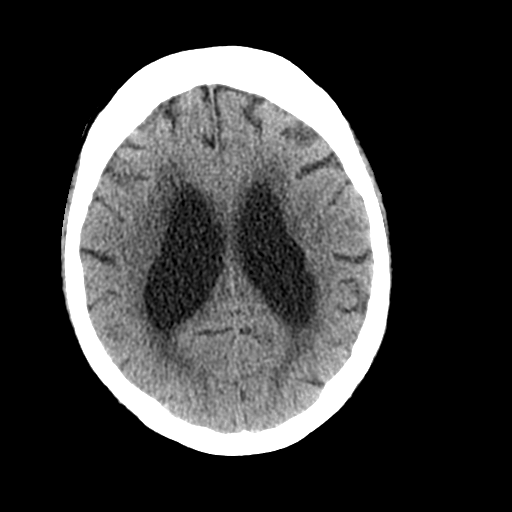
[im 22/31  brain]
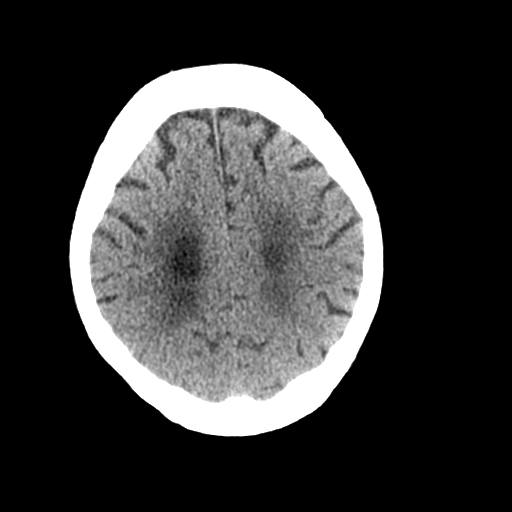
[im 25/31  brain]
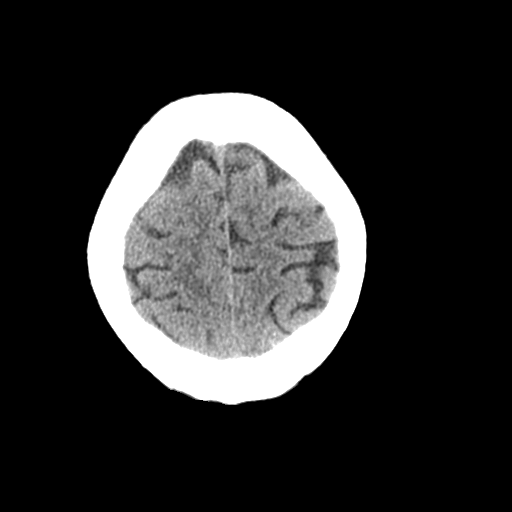
[im 28/31  brain]
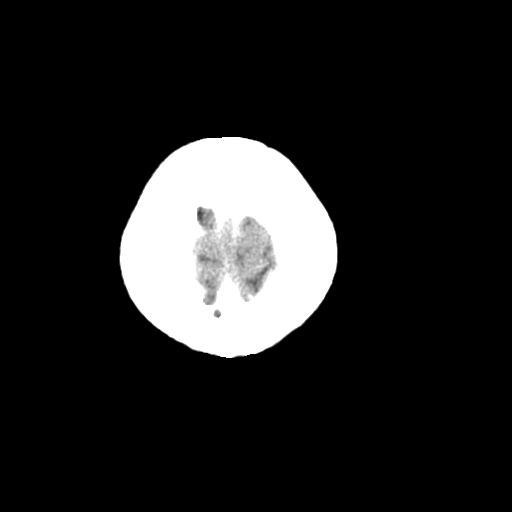
[im 28/31  bone]
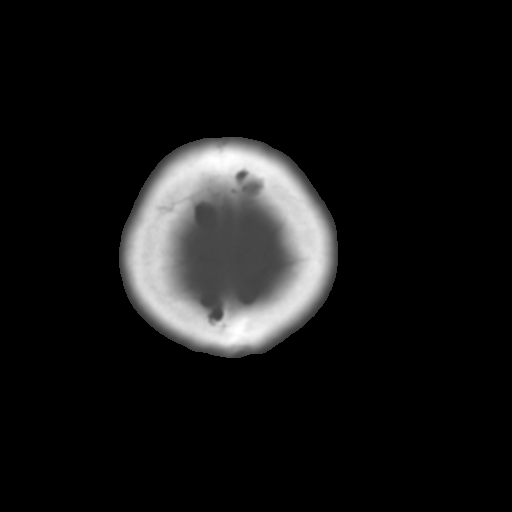

[Series 4: coronal soft · coronal · 0.31mm/px · 3 of 67 slices shown]
[im 23/67  brain]
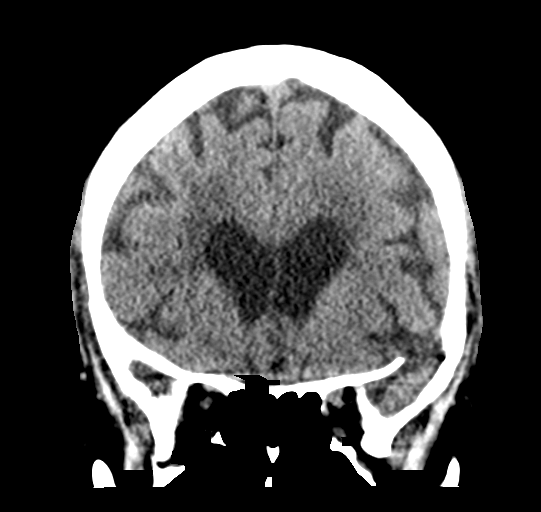
[im 30/67  brain]
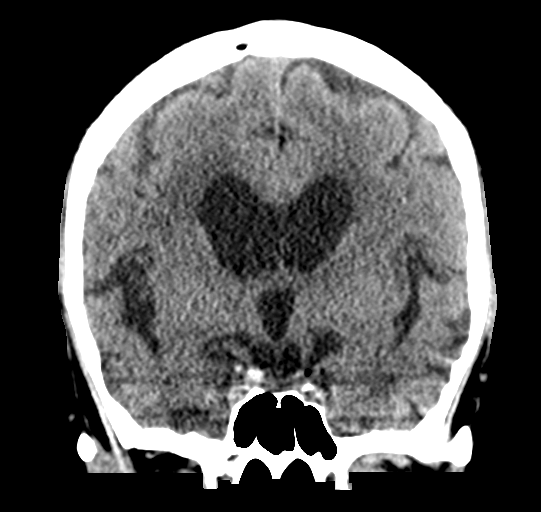
[im 37/67  brain]
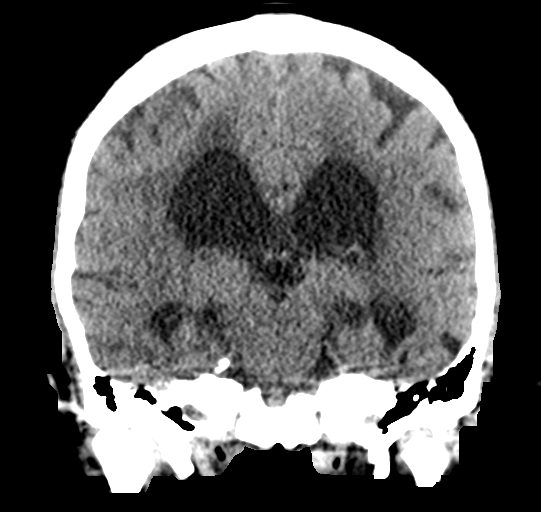

[Series 5: sagittal soft · sagittal · 0.33mm/px · 3 of 52 slices shown]
[im 18/52  brain]
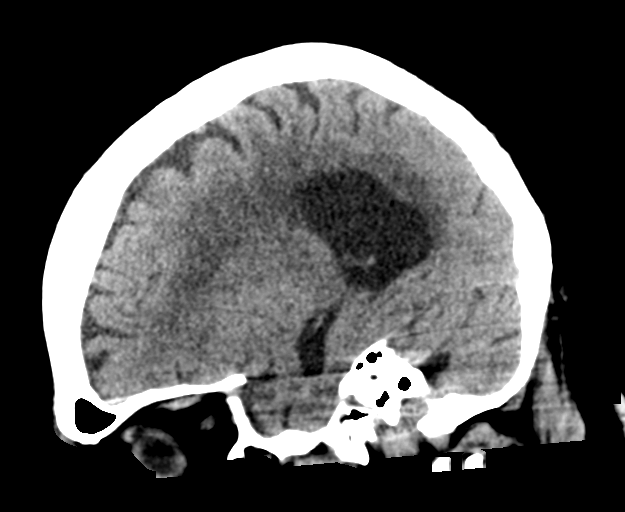
[im 26/52  brain]
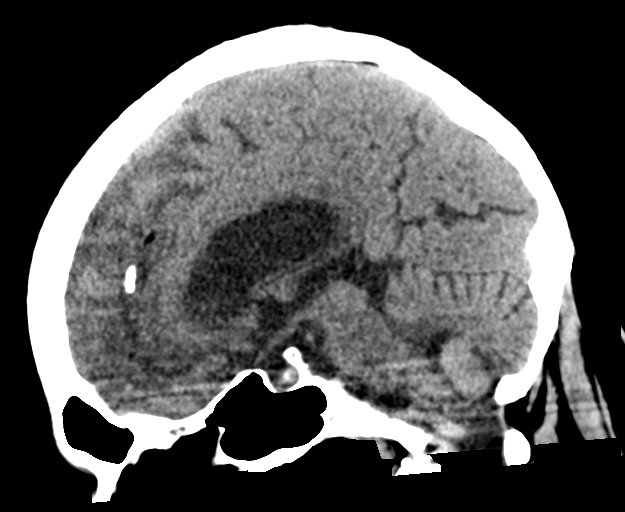
[im 35/52  brain]
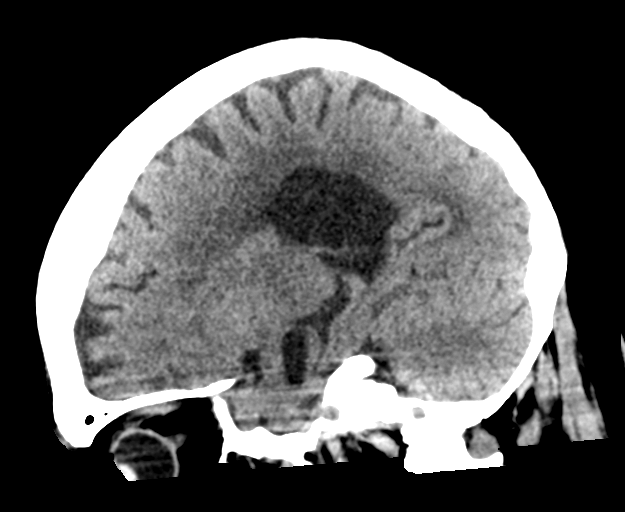

[15 of 47 positions shown; findings below may reference images not displayed]

FINDINGS: Brain: Chronic ventriculomegaly noted including temporal horn
dilatation, much of this may be ex vacuo, strictly speaking normal
pressure hydrocephalus is not readily excluded. Periventricular
white matter hypodensity is stable and likely primarily from chronic
ischemic microvascular white matter disease.

Otherwise, the brainstem, cerebellum, cerebral peduncles, thalamus,
basal ganglia, basilar cisterns, and ventricular system appear
within normal limits. No intracranial hemorrhage, mass lesion, or
acute CVA.

Vascular: Unremarkable

Skull: Unremarkable

Sinuses/Orbits: Unremarkable

Other: No supplemental non-categorized findings.
IMPRESSION: 1. No acute findings and no change from prior exams.
2. Ventriculomegaly may be ex vacuo, but strictly speaking normal
pressure hydrocephalus is not completely excluded.
3. Periventricular white matter and corona radiata hypodensities
favor chronic ischemic microvascular white matter disease.

## 2023-08-20 IMAGING — DX DG CHEST 1V PORT
1 series · 1 of 1 positions shown · non-contrast
Comparison: Chest x-ray dated February 04, 2021

CLINICAL DATA: Altered mental status. Chest x-ray dated February 04, 2021

EXAM:
PORTABLE CHEST 1 VIEW

[chest ap]
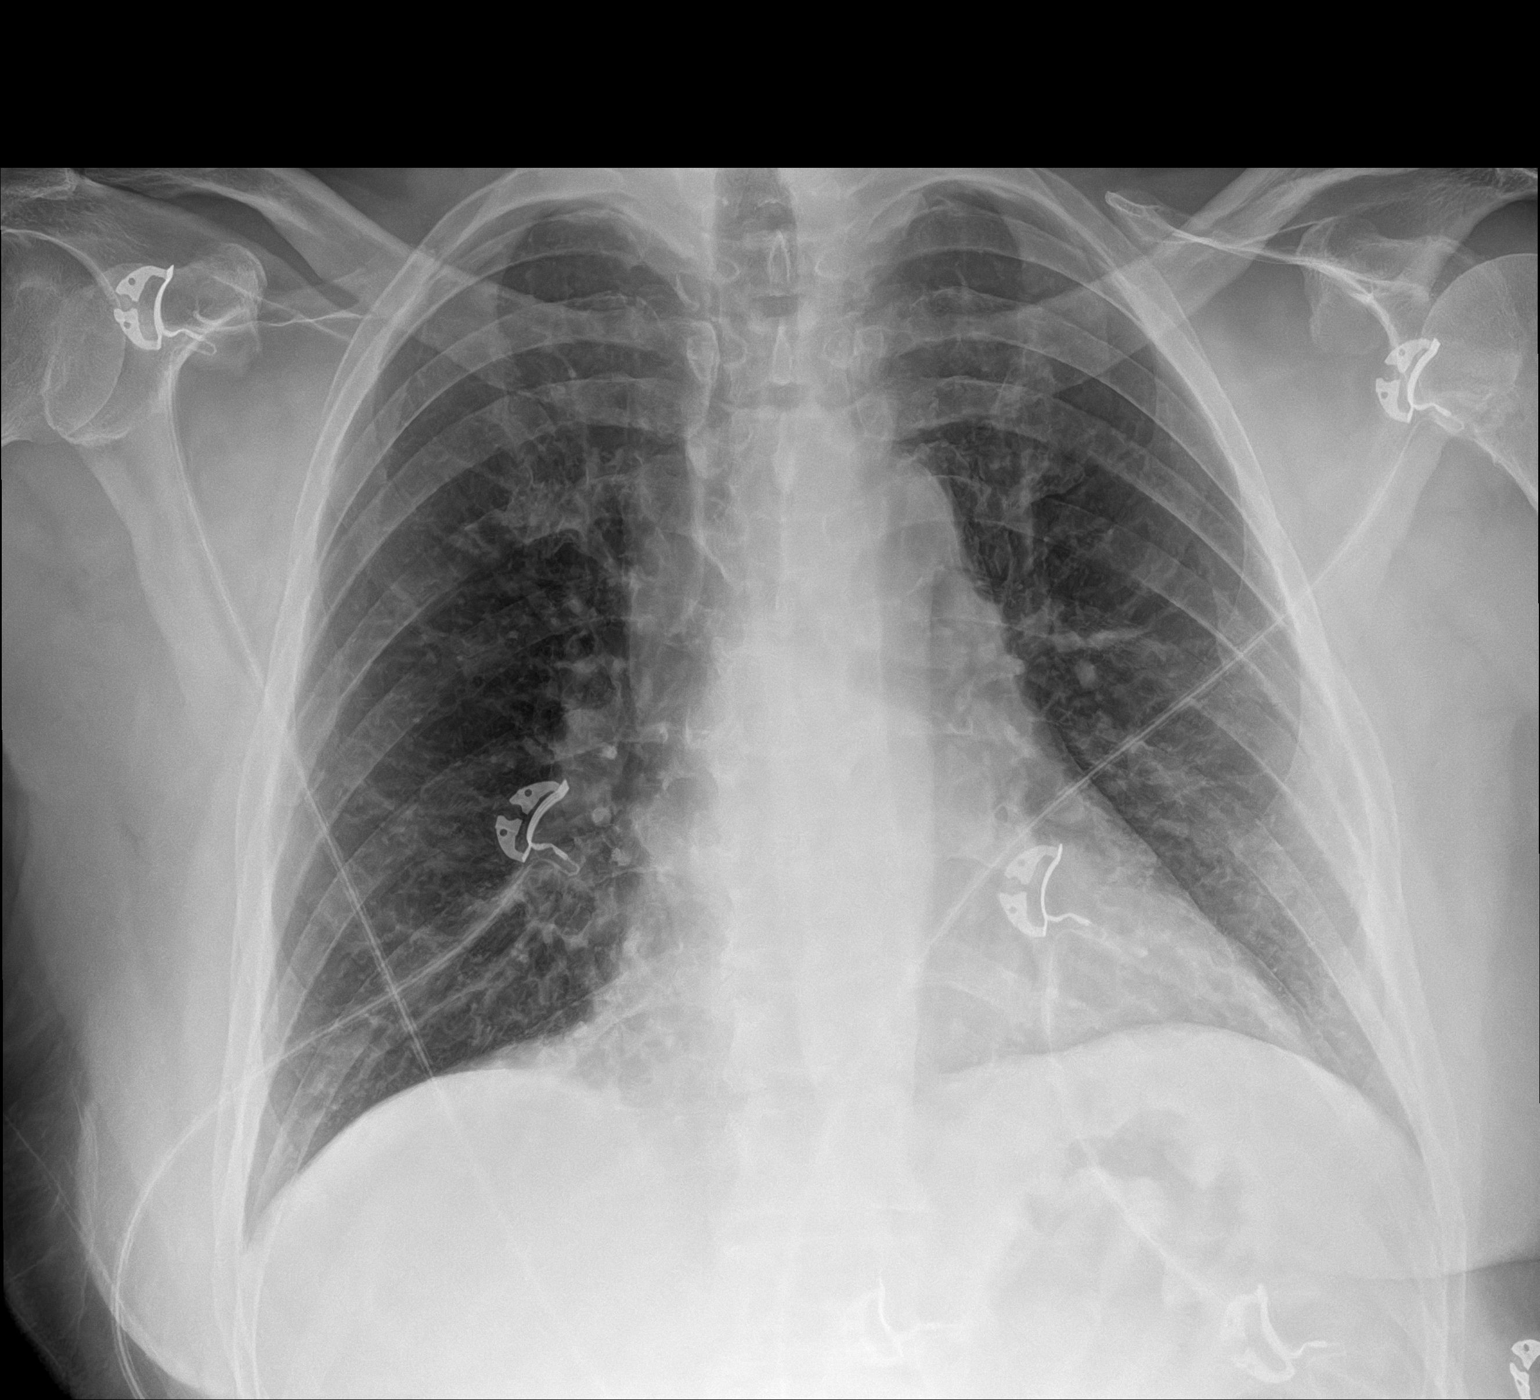

[1 of 1 positions shown; findings below may reference images not displayed]

FINDINGS: The heart size and mediastinal contours are within normal limits. No
acute parenchymal opacity. The visualized skeletal structures are
unremarkable.
IMPRESSION: No active disease.
